# Patient Record
Sex: Female | Born: 1950 | Race: White | Hispanic: No | Smoking: Current every day smoker
Health system: Southern US, Community
[De-identification: ages and names within clinical notes are randomized; demographics above are authoritative.]

## PROBLEM LIST (undated history)

## (undated) DIAGNOSIS — I10 Essential (primary) hypertension: Secondary | ICD-10-CM

## (undated) DIAGNOSIS — J449 Chronic obstructive pulmonary disease, unspecified: Secondary | ICD-10-CM

## (undated) DIAGNOSIS — E785 Hyperlipidemia, unspecified: Secondary | ICD-10-CM

## (undated) DIAGNOSIS — Z720 Tobacco use: Secondary | ICD-10-CM

## (undated) DIAGNOSIS — I509 Heart failure, unspecified: Secondary | ICD-10-CM

## (undated) DIAGNOSIS — J410 Simple chronic bronchitis: Secondary | ICD-10-CM

## (undated) DIAGNOSIS — Z972 Presence of dental prosthetic device (complete) (partial): Secondary | ICD-10-CM

## (undated) DIAGNOSIS — T8859XA Other complications of anesthesia, initial encounter: Secondary | ICD-10-CM

## (undated) DIAGNOSIS — E049 Nontoxic goiter, unspecified: Secondary | ICD-10-CM

## (undated) DIAGNOSIS — I4891 Unspecified atrial fibrillation: Secondary | ICD-10-CM

---

## 1972-12-12 HISTORY — PX: BREAST LUMPECTOMY: SHX2

## 1988-12-12 HISTORY — PX: ABDOMINAL HYSTERECTOMY: SHX81

## 2011-12-13 HISTORY — PX: CHOLECYSTECTOMY: SHX55

## 2012-02-23 DIAGNOSIS — E039 Hypothyroidism, unspecified: Secondary | ICD-10-CM | POA: Insufficient documentation

## 2015-05-28 DIAGNOSIS — I10 Essential (primary) hypertension: Secondary | ICD-10-CM | POA: Diagnosis not present

## 2015-05-28 DIAGNOSIS — E039 Hypothyroidism, unspecified: Secondary | ICD-10-CM | POA: Diagnosis not present

## 2015-05-28 DIAGNOSIS — Z23 Encounter for immunization: Secondary | ICD-10-CM | POA: Diagnosis not present

## 2015-05-28 DIAGNOSIS — E780 Pure hypercholesterolemia: Secondary | ICD-10-CM | POA: Diagnosis not present

## 2015-05-28 DIAGNOSIS — K219 Gastro-esophageal reflux disease without esophagitis: Secondary | ICD-10-CM | POA: Diagnosis not present

## 2015-10-02 DIAGNOSIS — Z23 Encounter for immunization: Secondary | ICD-10-CM | POA: Diagnosis not present

## 2015-12-30 DIAGNOSIS — E669 Obesity, unspecified: Secondary | ICD-10-CM | POA: Diagnosis not present

## 2015-12-30 DIAGNOSIS — I10 Essential (primary) hypertension: Secondary | ICD-10-CM | POA: Diagnosis not present

## 2015-12-30 DIAGNOSIS — E7800 Pure hypercholesterolemia, unspecified: Secondary | ICD-10-CM | POA: Diagnosis not present

## 2015-12-30 DIAGNOSIS — E039 Hypothyroidism, unspecified: Secondary | ICD-10-CM | POA: Diagnosis not present

## 2016-06-28 DIAGNOSIS — E7800 Pure hypercholesterolemia, unspecified: Secondary | ICD-10-CM | POA: Diagnosis not present

## 2016-06-28 DIAGNOSIS — Z139 Encounter for screening, unspecified: Secondary | ICD-10-CM | POA: Diagnosis not present

## 2016-06-28 DIAGNOSIS — Z9181 History of falling: Secondary | ICD-10-CM | POA: Diagnosis not present

## 2016-06-28 DIAGNOSIS — E039 Hypothyroidism, unspecified: Secondary | ICD-10-CM | POA: Diagnosis not present

## 2016-06-28 DIAGNOSIS — I10 Essential (primary) hypertension: Secondary | ICD-10-CM | POA: Diagnosis not present

## 2016-06-28 DIAGNOSIS — Z1389 Encounter for screening for other disorder: Secondary | ICD-10-CM | POA: Diagnosis not present

## 2017-07-17 DIAGNOSIS — K219 Gastro-esophageal reflux disease without esophagitis: Secondary | ICD-10-CM | POA: Diagnosis not present

## 2017-07-17 DIAGNOSIS — E7800 Pure hypercholesterolemia, unspecified: Secondary | ICD-10-CM | POA: Diagnosis not present

## 2017-07-17 DIAGNOSIS — E042 Nontoxic multinodular goiter: Secondary | ICD-10-CM | POA: Diagnosis not present

## 2017-07-17 DIAGNOSIS — I10 Essential (primary) hypertension: Secondary | ICD-10-CM | POA: Diagnosis not present

## 2017-07-17 DIAGNOSIS — Z9181 History of falling: Secondary | ICD-10-CM | POA: Diagnosis not present

## 2017-07-17 DIAGNOSIS — Z139 Encounter for screening, unspecified: Secondary | ICD-10-CM | POA: Diagnosis not present

## 2017-09-28 DIAGNOSIS — Z23 Encounter for immunization: Secondary | ICD-10-CM | POA: Diagnosis not present

## 2018-01-18 DIAGNOSIS — I10 Essential (primary) hypertension: Secondary | ICD-10-CM | POA: Diagnosis not present

## 2018-01-18 DIAGNOSIS — E042 Nontoxic multinodular goiter: Secondary | ICD-10-CM | POA: Diagnosis not present

## 2018-01-18 DIAGNOSIS — E785 Hyperlipidemia, unspecified: Secondary | ICD-10-CM | POA: Diagnosis not present

## 2018-01-18 DIAGNOSIS — Z136 Encounter for screening for cardiovascular disorders: Secondary | ICD-10-CM | POA: Diagnosis not present

## 2018-01-18 DIAGNOSIS — Z0000 Encounter for general adult medical examination without abnormal findings: Secondary | ICD-10-CM | POA: Diagnosis not present

## 2018-01-18 DIAGNOSIS — Z23 Encounter for immunization: Secondary | ICD-10-CM | POA: Diagnosis not present

## 2019-05-30 DIAGNOSIS — R609 Edema, unspecified: Secondary | ICD-10-CM | POA: Diagnosis not present

## 2019-05-30 DIAGNOSIS — M2550 Pain in unspecified joint: Secondary | ICD-10-CM | POA: Diagnosis not present

## 2019-05-30 DIAGNOSIS — I10 Essential (primary) hypertension: Secondary | ICD-10-CM | POA: Diagnosis not present

## 2019-07-26 DIAGNOSIS — I10 Essential (primary) hypertension: Secondary | ICD-10-CM | POA: Diagnosis not present

## 2019-07-26 DIAGNOSIS — E7800 Pure hypercholesterolemia, unspecified: Secondary | ICD-10-CM | POA: Diagnosis not present

## 2019-07-26 DIAGNOSIS — K219 Gastro-esophageal reflux disease without esophagitis: Secondary | ICD-10-CM | POA: Diagnosis not present

## 2019-07-26 DIAGNOSIS — M1990 Unspecified osteoarthritis, unspecified site: Secondary | ICD-10-CM | POA: Diagnosis not present

## 2019-09-26 ENCOUNTER — Other Ambulatory Visit: Payer: Self-pay

## 2019-09-26 NOTE — Patient Outreach (Signed)
[facility]) Care Management  09/26/2019  Stacey Sandoval July 04, 1951 [id number]   Medication Adherence call to Stacey Sandoval Hipp[facility] Identifiers Verify spoke with patient she is past due on Atorvastatin 20 mg and Lisinopril/Hctz 20/12.5 mg patient explain she is taking both medications,patient has enough for 10 more days patient will order thru [facility] website in a couple of days. [name] is showing past due under [name].   [place] Management Direct Dial [phone]  Fax [phone] [email or web address]

## 2019-10-10 DIAGNOSIS — Z23 Encounter for immunization: Secondary | ICD-10-CM | POA: Diagnosis not present

## 2019-10-15 ENCOUNTER — Other Ambulatory Visit: Payer: Self-pay

## 2019-10-15 NOTE — Patient Outreach (Signed)
[place] [facility]) Care Management  10/15/2019  MILCA SYTSMA 1951/01/30 [id number]   Medication Adherence call to Mrs. Lyman Bishop Hippa Identifiers Verify spoke with patient she is past due on Atorvastatin 20 mg and Lisinopril/Hctz 20/12.5 mg,patient explain she takes her medication un a regular basis,patient has medication at this time but will order on line. Mrs. Spadaccini is showing past due under [place].   [place] Management Direct Dial [phone]  Fax [phone] [email or web address]

## 2020-02-24 DIAGNOSIS — Z139 Encounter for screening, unspecified: Secondary | ICD-10-CM | POA: Diagnosis not present

## 2020-02-24 DIAGNOSIS — E7800 Pure hypercholesterolemia, unspecified: Secondary | ICD-10-CM | POA: Diagnosis not present

## 2020-02-24 DIAGNOSIS — Z9181 History of falling: Secondary | ICD-10-CM | POA: Diagnosis not present

## 2020-02-24 DIAGNOSIS — M1990 Unspecified osteoarthritis, unspecified site: Secondary | ICD-10-CM | POA: Diagnosis not present

## 2020-02-24 DIAGNOSIS — I10 Essential (primary) hypertension: Secondary | ICD-10-CM | POA: Diagnosis not present

## 2020-02-24 DIAGNOSIS — K219 Gastro-esophageal reflux disease without esophagitis: Secondary | ICD-10-CM | POA: Diagnosis not present

## 2020-05-15 DIAGNOSIS — I10 Essential (primary) hypertension: Secondary | ICD-10-CM | POA: Diagnosis not present

## 2020-08-26 DIAGNOSIS — Z139 Encounter for screening, unspecified: Secondary | ICD-10-CM | POA: Diagnosis not present

## 2020-08-26 DIAGNOSIS — E7800 Pure hypercholesterolemia, unspecified: Secondary | ICD-10-CM | POA: Diagnosis not present

## 2020-08-26 DIAGNOSIS — K219 Gastro-esophageal reflux disease without esophagitis: Secondary | ICD-10-CM | POA: Diagnosis not present

## 2020-08-26 DIAGNOSIS — M1990 Unspecified osteoarthritis, unspecified site: Secondary | ICD-10-CM | POA: Diagnosis not present

## 2020-08-26 DIAGNOSIS — I10 Essential (primary) hypertension: Secondary | ICD-10-CM | POA: Diagnosis not present

## 2020-08-26 DIAGNOSIS — Z23 Encounter for immunization: Secondary | ICD-10-CM | POA: Diagnosis not present

## 2020-08-31 DIAGNOSIS — S335XXA Sprain of ligaments of lumbar spine, initial encounter: Secondary | ICD-10-CM | POA: Diagnosis not present

## 2020-08-31 DIAGNOSIS — S76011A Strain of muscle, fascia and tendon of right hip, initial encounter: Secondary | ICD-10-CM | POA: Diagnosis not present

## 2020-09-16 DIAGNOSIS — M545 Low back pain, unspecified: Secondary | ICD-10-CM | POA: Diagnosis not present

## 2020-09-18 DIAGNOSIS — M5106 Intervertebral disc disorders with myelopathy, lumbar region: Secondary | ICD-10-CM | POA: Diagnosis not present

## 2020-09-28 DIAGNOSIS — M5416 Radiculopathy, lumbar region: Secondary | ICD-10-CM | POA: Diagnosis not present

## 2020-10-14 DIAGNOSIS — M5416 Radiculopathy, lumbar region: Secondary | ICD-10-CM | POA: Diagnosis not present

## 2020-10-27 DIAGNOSIS — M5416 Radiculopathy, lumbar region: Secondary | ICD-10-CM | POA: Diagnosis not present

## 2020-11-11 DIAGNOSIS — M5416 Radiculopathy, lumbar region: Secondary | ICD-10-CM | POA: Diagnosis not present

## 2021-01-06 DIAGNOSIS — M5416 Radiculopathy, lumbar region: Secondary | ICD-10-CM | POA: Diagnosis not present

## 2021-03-03 DIAGNOSIS — M545 Low back pain, unspecified: Secondary | ICD-10-CM | POA: Diagnosis not present

## 2021-03-03 DIAGNOSIS — K219 Gastro-esophageal reflux disease without esophagitis: Secondary | ICD-10-CM | POA: Diagnosis not present

## 2021-03-03 DIAGNOSIS — Z9181 History of falling: Secondary | ICD-10-CM | POA: Diagnosis not present

## 2021-03-03 DIAGNOSIS — M1990 Unspecified osteoarthritis, unspecified site: Secondary | ICD-10-CM | POA: Diagnosis not present

## 2021-03-03 DIAGNOSIS — I10 Essential (primary) hypertension: Secondary | ICD-10-CM | POA: Diagnosis not present

## 2021-03-03 DIAGNOSIS — E7800 Pure hypercholesterolemia, unspecified: Secondary | ICD-10-CM | POA: Diagnosis not present

## 2021-03-03 DIAGNOSIS — F32A Depression, unspecified: Secondary | ICD-10-CM | POA: Diagnosis not present

## 2021-08-27 DIAGNOSIS — L03011 Cellulitis of right finger: Secondary | ICD-10-CM | POA: Diagnosis not present

## 2021-09-14 DIAGNOSIS — Z23 Encounter for immunization: Secondary | ICD-10-CM | POA: Diagnosis not present

## 2021-09-14 DIAGNOSIS — M545 Low back pain, unspecified: Secondary | ICD-10-CM | POA: Diagnosis not present

## 2021-09-14 DIAGNOSIS — Z139 Encounter for screening, unspecified: Secondary | ICD-10-CM | POA: Diagnosis not present

## 2021-09-14 DIAGNOSIS — M1990 Unspecified osteoarthritis, unspecified site: Secondary | ICD-10-CM | POA: Diagnosis not present

## 2021-09-14 DIAGNOSIS — B3731 Acute candidiasis of vulva and vagina: Secondary | ICD-10-CM | POA: Diagnosis not present

## 2021-09-14 DIAGNOSIS — I10 Essential (primary) hypertension: Secondary | ICD-10-CM | POA: Diagnosis not present

## 2021-09-14 DIAGNOSIS — E039 Hypothyroidism, unspecified: Secondary | ICD-10-CM | POA: Diagnosis not present

## 2021-09-14 DIAGNOSIS — E7800 Pure hypercholesterolemia, unspecified: Secondary | ICD-10-CM | POA: Diagnosis not present

## 2021-09-14 DIAGNOSIS — K219 Gastro-esophageal reflux disease without esophagitis: Secondary | ICD-10-CM | POA: Diagnosis not present

## 2021-09-14 DIAGNOSIS — F32A Depression, unspecified: Secondary | ICD-10-CM | POA: Diagnosis not present

## 2021-10-25 DIAGNOSIS — Z0000 Encounter for general adult medical examination without abnormal findings: Secondary | ICD-10-CM | POA: Diagnosis not present

## 2021-10-25 DIAGNOSIS — Z9181 History of falling: Secondary | ICD-10-CM | POA: Diagnosis not present

## 2021-10-25 DIAGNOSIS — E785 Hyperlipidemia, unspecified: Secondary | ICD-10-CM | POA: Diagnosis not present

## 2022-01-06 DIAGNOSIS — I10 Essential (primary) hypertension: Secondary | ICD-10-CM | POA: Diagnosis not present

## 2022-01-06 DIAGNOSIS — R609 Edema, unspecified: Secondary | ICD-10-CM | POA: Diagnosis not present

## 2022-01-06 DIAGNOSIS — M545 Low back pain, unspecified: Secondary | ICD-10-CM | POA: Diagnosis not present

## 2022-01-06 DIAGNOSIS — R0602 Shortness of breath: Secondary | ICD-10-CM | POA: Diagnosis not present

## 2022-01-21 DIAGNOSIS — I10 Essential (primary) hypertension: Secondary | ICD-10-CM | POA: Diagnosis not present

## 2022-01-21 DIAGNOSIS — M545 Low back pain, unspecified: Secondary | ICD-10-CM | POA: Diagnosis not present

## 2022-01-21 DIAGNOSIS — R609 Edema, unspecified: Secondary | ICD-10-CM | POA: Diagnosis not present

## 2022-03-03 DIAGNOSIS — R0602 Shortness of breath: Secondary | ICD-10-CM | POA: Diagnosis not present

## 2022-03-03 DIAGNOSIS — R112 Nausea with vomiting, unspecified: Secondary | ICD-10-CM | POA: Diagnosis not present

## 2022-03-03 DIAGNOSIS — R6889 Other general symptoms and signs: Secondary | ICD-10-CM | POA: Diagnosis not present

## 2022-03-03 DIAGNOSIS — R059 Cough, unspecified: Secondary | ICD-10-CM | POA: Diagnosis not present

## 2022-03-07 ENCOUNTER — Inpatient Hospital Stay
Admission: EM | Admit: 2022-03-07 | Discharge: 2022-03-16 | DRG: 190 | Disposition: A | Discharge status: Home Health | Payer: Medicare Other | Attending: Internal Medicine | Admitting: Internal Medicine

## 2022-03-07 ENCOUNTER — Encounter: Payer: Self-pay | Admitting: Emergency Medicine

## 2022-03-07 ENCOUNTER — Emergency Department: Payer: Medicare Other

## 2022-03-07 ENCOUNTER — Other Ambulatory Visit: Payer: Self-pay

## 2022-03-07 DIAGNOSIS — I110 Hypertensive heart disease with heart failure: Secondary | ICD-10-CM | POA: Diagnosis not present

## 2022-03-07 DIAGNOSIS — K449 Diaphragmatic hernia without obstruction or gangrene: Secondary | ICD-10-CM | POA: Diagnosis not present

## 2022-03-07 DIAGNOSIS — J069 Acute upper respiratory infection, unspecified: Secondary | ICD-10-CM | POA: Diagnosis present

## 2022-03-07 DIAGNOSIS — Z79899 Other long term (current) drug therapy: Secondary | ICD-10-CM | POA: Diagnosis not present

## 2022-03-07 DIAGNOSIS — J441 Chronic obstructive pulmonary disease with (acute) exacerbation: Secondary | ICD-10-CM | POA: Diagnosis not present

## 2022-03-07 DIAGNOSIS — Z20822 Contact with and (suspected) exposure to covid-19: Secondary | ICD-10-CM | POA: Diagnosis not present

## 2022-03-07 DIAGNOSIS — R0602 Shortness of breath: Secondary | ICD-10-CM | POA: Diagnosis not present

## 2022-03-07 DIAGNOSIS — F5104 Psychophysiologic insomnia: Secondary | ICD-10-CM | POA: Diagnosis present

## 2022-03-07 DIAGNOSIS — G4700 Insomnia, unspecified: Secondary | ICD-10-CM | POA: Diagnosis present

## 2022-03-07 DIAGNOSIS — T503X5A Adverse effect of electrolytic, caloric and water-balance agents, initial encounter: Secondary | ICD-10-CM | POA: Diagnosis not present

## 2022-03-07 DIAGNOSIS — I422 Other hypertrophic cardiomyopathy: Secondary | ICD-10-CM | POA: Diagnosis present

## 2022-03-07 DIAGNOSIS — I4891 Unspecified atrial fibrillation: Secondary | ICD-10-CM | POA: Diagnosis not present

## 2022-03-07 DIAGNOSIS — R051 Acute cough: Secondary | ICD-10-CM | POA: Diagnosis not present

## 2022-03-07 DIAGNOSIS — I959 Hypotension, unspecified: Secondary | ICD-10-CM | POA: Diagnosis not present

## 2022-03-07 DIAGNOSIS — E785 Hyperlipidemia, unspecified: Secondary | ICD-10-CM | POA: Diagnosis present

## 2022-03-07 DIAGNOSIS — J811 Chronic pulmonary edema: Secondary | ICD-10-CM

## 2022-03-07 DIAGNOSIS — I5033 Acute on chronic diastolic (congestive) heart failure: Secondary | ICD-10-CM

## 2022-03-07 DIAGNOSIS — F17210 Nicotine dependence, cigarettes, uncomplicated: Secondary | ICD-10-CM | POA: Diagnosis present

## 2022-03-07 DIAGNOSIS — F17200 Nicotine dependence, unspecified, uncomplicated: Secondary | ICD-10-CM

## 2022-03-07 DIAGNOSIS — B348 Other viral infections of unspecified site: Secondary | ICD-10-CM

## 2022-03-07 DIAGNOSIS — Z6831 Body mass index (BMI) 31.0-31.9, adult: Secondary | ICD-10-CM

## 2022-03-07 DIAGNOSIS — I5021 Acute systolic (congestive) heart failure: Secondary | ICD-10-CM | POA: Diagnosis not present

## 2022-03-07 DIAGNOSIS — B9789 Other viral agents as the cause of diseases classified elsewhere: Secondary | ICD-10-CM | POA: Diagnosis not present

## 2022-03-07 DIAGNOSIS — R079 Chest pain, unspecified: Secondary | ICD-10-CM | POA: Diagnosis not present

## 2022-03-07 DIAGNOSIS — E876 Hypokalemia: Secondary | ICD-10-CM

## 2022-03-07 DIAGNOSIS — Z5329 Procedure and treatment not carried out because of patient's decision for other reasons: Secondary | ICD-10-CM | POA: Diagnosis not present

## 2022-03-07 DIAGNOSIS — E871 Hypo-osmolality and hyponatremia: Secondary | ICD-10-CM

## 2022-03-07 DIAGNOSIS — I10 Essential (primary) hypertension: Secondary | ICD-10-CM | POA: Diagnosis not present

## 2022-03-07 DIAGNOSIS — J9601 Acute respiratory failure with hypoxia: Secondary | ICD-10-CM | POA: Diagnosis not present

## 2022-03-07 DIAGNOSIS — J810 Acute pulmonary edema: Secondary | ICD-10-CM | POA: Insufficient documentation

## 2022-03-07 DIAGNOSIS — J8410 Pulmonary fibrosis, unspecified: Secondary | ICD-10-CM | POA: Diagnosis not present

## 2022-03-07 DIAGNOSIS — I517 Cardiomegaly: Secondary | ICD-10-CM | POA: Diagnosis not present

## 2022-03-07 DIAGNOSIS — R059 Cough, unspecified: Secondary | ICD-10-CM | POA: Diagnosis not present

## 2022-03-07 DIAGNOSIS — E669 Obesity, unspecified: Secondary | ICD-10-CM

## 2022-03-07 HISTORY — DX: Chronic obstructive pulmonary disease, unspecified: J44.9

## 2022-03-07 HISTORY — DX: Essential (primary) hypertension: I10

## 2022-03-07 HISTORY — DX: Tobacco use: Z72.0

## 2022-03-07 HISTORY — DX: Hyperlipidemia, unspecified: E78.5

## 2022-03-07 LAB — BASIC METABOLIC PANEL
Anion gap: 13 (ref 5–15)
BUN: 13 mg/dL (ref 8–23)
CO2: 25 mmol/L (ref 22–32)
Calcium: 9.3 mg/dL (ref 8.9–10.3)
Chloride: 89 mmol/L — ABNORMAL LOW (ref 98–111)
Creatinine, Ser: 0.69 mg/dL (ref 0.44–1.00)
GFR, Estimated: >60 mL/min (ref >60)
Glucose, Bld: 100 mg/dL — ABNORMAL HIGH (ref 70–99)
Potassium: 4.2 mmol/L (ref 3.5–5.1)
Sodium: 127 mmol/L — ABNORMAL LOW (ref 135–145)

## 2022-03-07 LAB — CBC
HCT: 38.5 % (ref 36.0–46.0)
Hemoglobin: 12.3 g/dL (ref 12.0–15.0)
MCH: 31.8 pg (ref 26.0–34.0)
MCHC: 31.9 g/dL (ref 30.0–36.0)
MCV: 99.5 fL (ref 80.0–100.0)
Platelets: 287 10*3/uL (ref 150–400)
RBC: 3.87 MIL/uL (ref 3.87–5.11)
RDW: 12.1 % (ref 11.5–15.5)
WBC: 7.1 10*3/uL (ref 4.0–10.5)
nRBC: 0 % (ref 0.0–0.2)

## 2022-03-07 LAB — TROPONIN I (HIGH SENSITIVITY)
Troponin I (High Sensitivity): 16 ng/L (ref <18)
Troponin I (High Sensitivity): 16 ng/L (ref <18)

## 2022-03-07 LAB — TSH: TSH: 0.208 u[IU]/mL — ABNORMAL LOW (ref 0.350–4.500)

## 2022-03-07 LAB — T4, FREE: Free T4: 0.97 ng/dL (ref 0.61–1.12)

## 2022-03-07 LAB — BRAIN NATRIURETIC PEPTIDE: B Natriuretic Peptide: 82.2 pg/mL (ref 0.0–100.0)

## 2022-03-07 LAB — RESP PANEL BY RT-PCR (FLU A&B, COVID) ARPGX2
Influenza A by PCR: NEGATIVE
Influenza B by PCR: NEGATIVE
SARS Coronavirus 2 by RT PCR: NEGATIVE

## 2022-03-07 LAB — PROCALCITONIN: Procalcitonin: <0.1 ng/mL

## 2022-03-07 MED ORDER — IOHEXOL 350 MG/ML SOLN
75.0000 mL | Freq: Once | INTRAVENOUS | Status: AC | PRN
  Administered 2022-03-07: 75 mL via INTRAVENOUS

## 2022-03-07 MED ORDER — METHYLPREDNISOLONE SODIUM SUCC 125 MG IJ SOLR
125.0000 mg | Freq: Once | INTRAMUSCULAR | Status: AC
  Administered 2022-03-07: 125 mg via INTRAVENOUS
  Filled 2022-03-07: qty 2

## 2022-03-07 MED ORDER — ACETAMINOPHEN 650 MG RE SUPP: 650.0000 mg | Freq: Four times a day (QID) | RECTAL | Status: AC | PRN

## 2022-03-07 MED ORDER — METHYLPREDNISOLONE SODIUM SUCC 40 MG IJ SOLR: 40.0000 mg | Freq: Two times a day (BID) | INTRAMUSCULAR | Status: DC

## 2022-03-07 MED ORDER — ACETAMINOPHEN 325 MG PO TABS: 650.0000 mg | ORAL_TABLET | Freq: Four times a day (QID) | ORAL | Status: AC | PRN

## 2022-03-07 MED ORDER — ONDANSETRON HCL 4 MG PO TABS: 4.0000 mg | ORAL_TABLET | Freq: Four times a day (QID) | ORAL | Status: DC | PRN

## 2022-03-07 MED ORDER — ENOXAPARIN SODIUM 40 MG/0.4ML IJ SOSY
40.0000 mg | PREFILLED_SYRINGE | INTRAMUSCULAR | Status: DC
  Administered 2022-03-07 – 2022-03-13 (×7): 40 mg via SUBCUTANEOUS
  Filled 2022-03-07 (×7): qty 0.4

## 2022-03-07 MED ORDER — SODIUM CHLORIDE 0.9 % IV SOLN
500.0000 mg | INTRAVENOUS | Status: DC
  Administered 2022-03-07 – 2022-03-09 (×3): 500 mg via INTRAVENOUS
  Filled 2022-03-07: qty 500
  Filled 2022-03-07 (×2): qty 5

## 2022-03-07 MED ORDER — IPRATROPIUM-ALBUTEROL 0.5-2.5 (3) MG/3ML IN SOLN
3.0000 mL | Freq: Once | RESPIRATORY_TRACT | Status: AC
  Administered 2022-03-07: 3 mL via RESPIRATORY_TRACT
  Filled 2022-03-07: qty 6

## 2022-03-07 MED ORDER — SODIUM CHLORIDE 0.9 % IV SOLN
2.0000 g | INTRAVENOUS | Status: DC
  Administered 2022-03-07 – 2022-03-08 (×2): 2 g via INTRAVENOUS
  Filled 2022-03-07: qty 20
  Filled 2022-03-07: qty 2

## 2022-03-07 MED ORDER — DM-GUAIFENESIN ER 30-600 MG PO TB12
1.0000 | ORAL_TABLET | Freq: Two times a day (BID) | ORAL | Status: DC | PRN
  Administered 2022-03-07: 1 via ORAL
  Filled 2022-03-07 (×2): qty 1

## 2022-03-07 MED ORDER — ONDANSETRON HCL 4 MG/2ML IJ SOLN: 4.0000 mg | Freq: Four times a day (QID) | INTRAMUSCULAR | Status: DC | PRN

## 2022-03-07 MED ORDER — MELATONIN 5 MG PO TABS
5.0000 mg | ORAL_TABLET | Freq: Every evening | ORAL | Status: DC | PRN
  Administered 2022-03-10: 5 mg via ORAL
  Filled 2022-03-07: qty 1

## 2022-03-07 MED ORDER — PREDNISONE 20 MG PO TABS: 40.0000 mg | ORAL_TABLET | Freq: Every day | ORAL | Status: DC

## 2022-03-07 MED ORDER — LACTATED RINGERS IV SOLN: INTRAVENOUS | Status: DC

## 2022-03-07 NOTE — ED Triage Notes (Signed)
Pt via POV from home. Pt c/o SOB, cough, fatigue, and nausea. See first nurse note. Pt on 2L [place] at this time. Denies CHF/COPD. Denies any chest pain at this time. Pt is A&OX4 and NAD ?

## 2022-03-07 NOTE — Hospital Course (Addendum)
AcuteMs. Stacey Sandoval is a 71 year old female insomnia, hypertension, hyperlipidemia, who presents emergency department for chief concerns of shortness of breath and cough. ? ?Patient was diagnosed with COPD exacerbation, chronic Congestive Heart Failure.  She Was Given IV Steroids, IV Lasix. ?Condition Had Improved, Currently She Is Medically Stable to Be Discharged. ?

## 2022-03-07 NOTE — ED Provider Notes (Signed)
? ?[facility] ?Provider Note ? ? ? Event Date/Time  ? First MD Initiated Contact with Patient 03/07/22 1614   ?  (approximate) ? ? ?History  ? ?Shortness of Breath and Cough ? ? ?HPI ? ?Stacey Sandoval is a 71 y.o. female with a long history of smoking presents to the ER for evaluation of 1 week of progressively worsening cough congestion shortness of breath fatigue and exertional dyspnea.  States that her children were sick with recent viral illness but it was very mild.  She feels like she is gotten progressively more sick and states that her O2 saturation has been dipping into the 80s.  Does not wear home oxygen does not wear CPAP. ?  ? ? ?Physical Exam  ? ?Triage Vital Signs: ?ED Triage Vitals  ?Enc Vitals Group  ?   BP 03/07/22 1514 136/67  ?   Pulse Rate 03/07/22 1514 (!) 102  ?   Resp 03/07/22 1514 20  ?   Temp 03/07/22 1514 98.6 ?F (37 ?C)  ?   Temp Source 03/07/22 1514 Oral  ?   SpO2 03/07/22 1514 95 %  ?   Weight 03/07/22 1511 160 lb (72.6 kg)  ?   Height 03/07/22 1511 5' (1.524 m)  ?   Head Circumference --   ?   Peak Flow --   ?   Pain Score 03/07/22 1511 0  ?   Pain Loc --   ?   Pain Edu? --   ?   Excl. in GC? --   ? ? ?Most recent vital signs: ?Vitals:  ? 03/07/22 1900 03/07/22 2000  ?BP: 131/60 128/61  ?Pulse: 95 99  ?Resp: 18 18  ?Temp:    ?SpO2: 95% 96%  ? ? ? ?Constitutional: Alert  ?Eyes: Conjunctivae are normal.  ?Head: Atraumatic. ?Nose: No congestion/rhinnorhea. ?Mouth/Throat: Mucous membranes are moist.   ?Neck: Painless ROM.  ?Cardiovascular:   Good peripheral circulation. Holosytolic murmur, g/r ?Respiratory: Normal respiratory effort.  No retractions. Coarse bibasilar bs. No wheeze ?Gastrointestinal: Soft and nontender.  ?Musculoskeletal:  no deformity ?Neurologic:  MAE spontaneously. No gross focal neurologic deficits are appreciated.  ?Skin:  Skin is warm, dry and intact. No rash noted. ?Psychiatric: Mood and affect are normal. Speech and behavior are  normal. ? ? ? ?ED Results / Procedures / Treatments  ? ?Labs ?(all labs ordered are listed, but only abnormal results are displayed) ?Labs Reviewed  ?BASIC METABOLIC PANEL - Abnormal; Notable for the following components:  ?    Result Value  ? Sodium 127 (*)   ? Chloride 89 (*)   ? Glucose, Bld 100 (*)   ? All other components within normal limits  ?TSH - Abnormal; Notable for the following components:  ? TSH 0.208 (*)   ? All other components within normal limits  ?RESP PANEL BY RT-PCR (FLU A&B, COVID) [id number]  ?CULTURE, BLOOD (ROUTINE X 2)  ?CULTURE, BLOOD (ROUTINE X 2)  ?CBC  ?BRAIN NATRIURETIC PEPTIDE  ?T4, FREE  ?BASIC METABOLIC PANEL  ?CBC  ?TROPONIN I (HIGH SENSITIVITY)  ?TROPONIN I (HIGH SENSITIVITY)  ? ? ? ?EKG ? ?ED ECG REPORT ?I, Willy Eddy, the attending physician, personally viewed and interpreted this ECG. ? ? Date: 03/07/2022 ? EKG Time: 15:18 ? Rate: 100 ? Rhythm: sinus ? Axis: normal ? Intervals:  normal ? ST&T Change: no stemi, no depression ? ? ? ?RADIOLOGY ?Please see ED Course for my review and interpretation. ? ?I personally reviewed  all radiographic images ordered to evaluate for the above acute complaints and reviewed radiology reports and findings.  These findings were personally discussed with the patient.  Please see medical record for radiology report. ? ? ? ?PROCEDURES: ? ?Critical Care performed: Yes, see critical care procedure note(s) ? ?.Critical Care ?Performed by: Willy Eddy, MD ?Authorized by: Willy Eddy, MD  ? ?Critical care provider statement:  ?  Critical care time (minutes):  35 ?  Critical care was time spent personally by me on the following activities:  Ordering and performing treatments and interventions, ordering and review of laboratory studies, ordering and review of radiographic studies, pulse oximetry, re-evaluation of patient's condition, review of old charts, obtaining history from patient or surrogate, examination of patient, evaluation of  patient's response to treatment, discussions with primary provider, discussions with consultants and development of treatment plan with patient or surrogate ? ? ?MEDICATIONS ORDERED IN ED: ?Medications  ?lactated ringers infusion (has no administration in time range)  ?cefTRIAXone (ROCEPHIN) 2 g in sodium chloride 0.9 % 100 mL IVPB (2 g Intravenous New Bag/Given 03/07/22 1952)  ?azithromycin (ZITHROMAX) 500 mg in sodium chloride 0.9 % 250 mL IVPB (has no administration in time range)  ?acetaminophen (TYLENOL) tablet 650 mg (has no administration in time range)  ?  Or  ?acetaminophen (TYLENOL) suppository 650 mg (has no administration in time range)  ?ondansetron (ZOFRAN) tablet 4 mg (has no administration in time range)  ?  Or  ?ondansetron (ZOFRAN) injection 4 mg (has no administration in time range)  ?methylPREDNISolone sodium succinate (SOLU-MEDROL) 40 mg/mL injection 40 mg (has no administration in time range)  ?  Followed by  ?predniSONE (DELTASONE) tablet 40 mg (has no administration in time range)  ?enoxaparin (LOVENOX) injection 40 mg (has no administration in time range)  ?melatonin tablet 5 mg (has no administration in time range)  ?ipratropium-albuterol (DUONEB) 0.5-2.5 (3) MG/3ML nebulizer solution 3 mL (3 mLs Nebulization Given 03/07/22 1634)  ?iohexol (OMNIPAQUE) 350 MG/ML injection 75 mL (75 mLs Intravenous Contrast Given 03/07/22 1653)  ?methylPREDNISolone sodium succinate (SOLU-MEDROL) 125 mg/2 mL injection 125 mg (125 mg Intravenous Given 03/07/22 1946)  ? ? ? ?IMPRESSION / MDM / ASSESSMENT AND PLAN / ED COURSE  ?I reviewed the triage vital signs and the nursing notes. ?             ?               ? ?Differential diagnosis includes, but is not limited to, Asthma, copd, CHF, pna, ptx, malignancy, Pe, anemia ? ?Patient presenting with acute respiratory failure with hypoxia as described above.  Chest x-ray by my review interpretation does not show any evidence of pneumothorax.  Concern for possible  infiltrate versus malignancy.  Given her tachycardia and hypoxia will order CTA to further evaluate given concern for possible PE.  She does not have a white count not febrile not meeting septic criteria.  Her troponin is negative.  We will give nebulizer treatment but she does not much wheezing on exam we will reassess. ? ?Clinical Course as of 03/07/22 2023  ?Mon Mar 07, 2022  ?1943 CT imaging my interpretation review shows no pneumothorax.  Will cover for pneumonia given new hypoxia no PE.  RVP negative.  Does not seem consistent with congestive heart failure.  Given nebulizers and steroids given possible component of bronchitis COPD given her smoking history.  Given her hypoxia will require admission to the hospital. [PR]  ?2022 Patient discussed in consultation  with hospitalist who agreed admit patient to their service. [PR]  ?  ?Clinical Course User Index ?[PR] Willy Eddyobinson, Adrieanna Boteler, MD  ? ? ? ?FINAL CLINICAL IMPRESSION(S) / ED DIAGNOSES  ? ?Final diagnoses:  ?Acute respiratory failure with hypoxia (HCC)  ? ? ? ?Rx / DC Orders  ? ?ED Discharge Orders   ? ? None  ? ?  ? ? ? ?Note:  This document was prepared using Dragon voice recognition software and may include unintentional dictation errors. ? ?  ?Willy Eddyobinson, Averleigh Savary, MD ?03/07/22 2023 ? ?

## 2022-03-07 NOTE — ED Notes (Signed)
Pt has wet cough. States has bee SOB for 1 week. Worse with walking and lying flat. Pt on 2L. ?

## 2022-03-07 NOTE — ED Notes (Signed)
Blankets provided.

## 2022-03-07 NOTE — ED Notes (Signed)
Pt declines covid swab. Had rapid antigen test today at [facility] for covid and flu and was negative. ?

## 2022-03-07 NOTE — ED Notes (Signed)
Pt NAD on [place] 2LPM. A/ox4, speaking in full and complete sentences. c/o SOB and cough. LS clear bilaterally.  ?

## 2022-03-07 NOTE — H&P (Signed)
?History and Physical  ? ?Stacey IvanoffCarol A Sandoval [id number] DOB: [birth year] DOA: 03/07/2022 ? ?[facility]  ?Patient coming from: Home via POV ? ?I have personally briefly reviewed patient[facility]. ? ?Chief Concern: Cough and shortness of breath ? ?HPI: Ms. Stacey MunroeCarol Sandoval is a 71 year old female insomnia, hypertension, hyperlipidemia, who presents emergency department for chief concerns of shortness of breath and cough. ? ?ED vitals show temperature of 98.6, respiration rate of 20, heart rate 102, blood pressure 136/67, SPO2 of 95% on 2 L nasal cannula. ? ?Serum sodium 127, potassium 4.2, chloride 89, bicarb 25, BUN of 13, serum creatinine of 0.62, GFR greater than 60, nonfasting blood glucose 100, WBC 7.1, hemoglobin 12.3, platelets of 287. ? ?BNP was 82.2. ? ?High sensitive troponin was 16 and was unchanged at 16 on repeat. ? ?COVID/influenza A/influenza B PCR were negative. ? ?TSH was 0.208, T4: 0.87. ? ?ED treatment: DuoNeb x1, Solu-Medrol 125 mg, azithromycin 500 mg IV, ceftriaxone 2 g IV. ? ?At bedside she is able to tell me her full name, age, current calendar year and she knows her daughter is at bedside. ? ?Shortness of breath started month, endroses sick contacts. Daughter and grandchildren. She endorses nausea and vomiting, once or twice per day, food products.  ?She denies diarrhea.  ? ?Per daughter patient was prescribed outpatient Zofran 4 mg as needed for nausea, albuterol inhaler as needed.  Patient was also prescribed antitussives that was a gel capsule but patient did not want to take it as it is a cough suppressant.  Daughter has purchased patient over-the-counter Mucinex. ? ?Social history: She is a current tobacco user, smokes 1/2 per day. She quit about 1 week ago. She started at age 71. She drinks two glasses of wine every night. She is retired and was formerly a Engineer, civil (consulting)nurse  ? ?Vaccination history: she is vaccinated for covid 19 and influenza   ? ?ROS: ?Constitutional: no weight change, no fever ?ENT/Mouth: no sore throat, no rhinorrhea ?Eyes: no eye pain, no vision changes ?Cardiovascular: no chest pain, + dyspnea,  no edema, no palpitations ?Respiratory: + cough, no sputum, no wheezing ?Gastrointestinal: no nausea, no vomiting, no diarrhea, no constipation ?Genitourinary: no urinary incontinence, no dysuria, no hematuria ?Musculoskeletal: no arthralgias, no myalgias ?Skin: no skin lesions, no pruritus, ?Neuro: + weakness, no loss of consciousness, no syncope ?Psych: no anxiety, no depression, + decrease appetite ?Heme/Lymph: no bruising, no bleeding ? ?ED Course: Discussed with emergency medicine provider, patient requiring hospitalization for COPD exacerbation complicated by viral infection. ? ?Assessment/Plan ? ?Principal Problem: ?  Shortness of breath ?Active Problems: ?  Insomnia ?  Essential hypertension ?  Hyperlipidemia ?  ? ?Assessment and Plan: ?* Shortness of breath ?- Query COPD exacerbation complicated by viral respiratory infection ?- Query viral infection ?- COVID/influenza A/influenza B PCR were negative ?- She denies known sick contacts ?- Scheduled albuterol and Pulmicort nebulizer 3 times daily ?- Solu-Medrol 40 mg IV twice daily with taper to prednisone p.o. ?- Albuterol inhaler, 2 puffs every 6 hours as needed for shortness of breath and wheezing ?- Admit to telemetry medical, observation ? ?Hyperlipidemia ?- Resumed atorvastatin 20 mg daily ? ?Essential hypertension ?- I have resumed metoprolol succinate 25 mg daily ?- Have not resumed valsartan-hydrochlorothiazide 320-25 mg daily due to patient currently normotensive at this time ?- A.m. team to resume antihypertensive medications if indicated ? ?Insomnia ?- Patient reports that her primary care provider has tried a number  of medications including trazodone, melatonin, Ambien ?- She states that trazodone makes her vomit ?- She states the other medications did not work ?-  Ultimately she takes Benadryl nightly and drinks 2 glasses of wine with ice and this has somewhat helped her have sleep ?- I counseled patient on the harm of chronic Benadryl nightly use including loss of healthy sleep ?- I also counseled patient on drinking 2 full glasses of wine daily as this is beyond the recommended amount of alcohol for her gender ?-She does endorse that she takes 3 to 4 cups of coffee per day ?- I counseled patient on complete cessation of caffeine intake for 1 to 2 weeks and gradual decrease in removal of Benadryl and wine from her nightly routine ?- Patient can start with removing the caffeine first and then gradually decreasing to 1.5 glass and then 1 glass of wine nightly ? ?Chart reviewed.  ? ?DVT prophylaxis: Enoxaparin ?Code Status: Full code ?Diet: Heart healthy ?Family Communication: Updated daughter at bedside ?Disposition Plan: Pending clinical course ?Consults called: None at this time ?Admission status: Telemetry medical, observation ? ?Past Medical History:  ?Diagnosis Date  ? COPD (chronic obstructive pulmonary disease) (HCC)   ? Hyperlipidemia   ? Hypertension   ? Tobacco use   ? ?History reviewed. No pertinent surgical history. ? ?Social History:  reports that she has quit smoking. Her smoking use included cigarettes. She has quit using smokeless tobacco. She reports current alcohol use. She reports that she does not currently use drugs. ? ?No Known Allergies ?History reviewed. No pertinent family history. ?Family history: Family history reviewed and not pertinent ? ?Prior to Admission medications   ?Atorvastatin 20 mg daily  ? ?Physical Exam: ?Vitals:  ? 03/07/22 2000 03/07/22 2030 03/07/22 2200 03/07/22 2345  ?BP: 128/61 (!) 143/68 (!) 144/85   ?Pulse: 99 (!) 101 (!) 103 (!) 105  ?Resp: 18 18 18    ?Temp:   98.4 ?F (36.9 ?C)   ?TempSrc:   Oral   ?SpO2: 96% 94% 97% 94%  ?Weight:      ?Height:      ? ?Constitutional: appears age-appropriate, NAD, calm, comfortable ?Eyes: PERRL,  lids and conjunctivae normal ?ENMT: Mucous membranes are moist. Posterior pharynx clear of any exudate or lesions. Age-appropriate dentition. Hearing appropriate ?Neck: normal, supple, no masses, no thyromegaly ?Respiratory: clear to auscultation bilaterally, no wheezing, no crackles. Normal respiratory effort. No accessory muscle use.  ?Cardiovascular: Regular rate and rhythm, no murmurs / rubs / gallops. No extremity edema. 2+ pedal pulses. No carotid bruits.  ?Abdomen: no tenderness, no masses palpated, no hepatosplenomegaly. Bowel sounds positive.  ?Musculoskeletal: no clubbing / cyanosis. No joint deformity upper and lower extremities. Good ROM, no contractures, no atrophy. Normal muscle tone.  ?Skin: no rashes, lesions, ulcers. No induration ?Neurologic: Sensation intact. Strength 5/5 in all 4.  ?Psychiatric: Normal judgment and insight. Alert and oriented x 3. Normal mood.  ? ?EKG: independently reviewed, showing sinus tachycardia with rate of 101, QTc 412 ? ?Chest x-ray on Admission: I personally reviewed and I agree with radiologist reading as below. ? ?DG Chest 2 View ? ?Result Date: 03/07/2022 ?CLINICAL DATA:  Chest pain, cough EXAM: CHEST - 2 VIEW COMPARISON:  None. FINDINGS: Cardiac size is within normal limits. There are no signs of pulmonary edema. Increased markings are seen in the right lower lung fields. There is diffuse haziness in the medial left upper lung fields. Apparent shift of mediastinum to the left may be due to  rotation. Left lateral CP angle is indistinct. There is no pneumothorax. Dextroscoliosis is seen in the thoracic spine. Osteopenia is seen in bony structures. IMPRESSION: Increased markings in the right lower lung fields suggest scarring or subsegmental atelectasis/pneumonia. There is diffuse haziness in the medial left upper lung fields which may be an artifact caused by prominent brachiocephalic vessels or pleural thickening or focal pneumonia or neoplastic process in the left  upper lobe. Short-term follow-up chest radiographs along with CT if warranted should be considered. Electronically Signed   By: Ernie Avena M.D.   On: 03/07/2022 15:48  ? ?CT Angio Chest PE W and/or Wo Con

## 2022-03-07 NOTE — ED Triage Notes (Signed)
First Nurse Note:  From called telphone handoff, patient with 1 week history of ocough and SOB today.  RA sats were 83%, patient placed on 2l/ [place] and sats improved to 96%.  CXR showed LUL infiltrate and enlarged heart.  Negative for COVID and FLu. ? ?Patient arrives to ED on RA.  Placed on2l/ [place].  AAOx3.  Skin warm and dry. NAD ?

## 2022-03-08 ENCOUNTER — Observation Stay: Payer: Medicare Other

## 2022-03-08 ENCOUNTER — Inpatient Hospital Stay (HOSPITAL_COMMUNITY)
Admit: 2022-03-08 | Discharge: 2022-03-08 | Disposition: A | Discharge status: Home / Self Care | Payer: Medicare Other | Attending: Internal Medicine | Admitting: Internal Medicine

## 2022-03-08 ENCOUNTER — Encounter: Payer: Self-pay | Admitting: Internal Medicine

## 2022-03-08 DIAGNOSIS — I517 Cardiomegaly: Secondary | ICD-10-CM | POA: Diagnosis not present

## 2022-03-08 DIAGNOSIS — J9601 Acute respiratory failure with hypoxia: Secondary | ICD-10-CM | POA: Diagnosis not present

## 2022-03-08 DIAGNOSIS — I110 Hypertensive heart disease with heart failure: Secondary | ICD-10-CM | POA: Diagnosis present

## 2022-03-08 DIAGNOSIS — Z6831 Body mass index (BMI) 31.0-31.9, adult: Secondary | ICD-10-CM | POA: Diagnosis not present

## 2022-03-08 DIAGNOSIS — F5104 Psychophysiologic insomnia: Secondary | ICD-10-CM | POA: Diagnosis present

## 2022-03-08 DIAGNOSIS — B9789 Other viral agents as the cause of diseases classified elsewhere: Secondary | ICD-10-CM | POA: Diagnosis present

## 2022-03-08 DIAGNOSIS — R0602 Shortness of breath: Secondary | ICD-10-CM | POA: Diagnosis not present

## 2022-03-08 DIAGNOSIS — E785 Hyperlipidemia, unspecified: Secondary | ICD-10-CM | POA: Diagnosis present

## 2022-03-08 DIAGNOSIS — I4891 Unspecified atrial fibrillation: Secondary | ICD-10-CM | POA: Diagnosis not present

## 2022-03-08 DIAGNOSIS — J811 Chronic pulmonary edema: Secondary | ICD-10-CM

## 2022-03-08 DIAGNOSIS — I959 Hypotension, unspecified: Secondary | ICD-10-CM | POA: Diagnosis not present

## 2022-03-08 DIAGNOSIS — R0902 Hypoxemia: Secondary | ICD-10-CM | POA: Diagnosis not present

## 2022-03-08 DIAGNOSIS — E871 Hypo-osmolality and hyponatremia: Secondary | ICD-10-CM | POA: Diagnosis not present

## 2022-03-08 DIAGNOSIS — Z20822 Contact with and (suspected) exposure to covid-19: Secondary | ICD-10-CM | POA: Diagnosis present

## 2022-03-08 DIAGNOSIS — F17210 Nicotine dependence, cigarettes, uncomplicated: Secondary | ICD-10-CM | POA: Diagnosis present

## 2022-03-08 DIAGNOSIS — I5021 Acute systolic (congestive) heart failure: Secondary | ICD-10-CM | POA: Diagnosis not present

## 2022-03-08 DIAGNOSIS — T503X5A Adverse effect of electrolytic, caloric and water-balance agents, initial encounter: Secondary | ICD-10-CM | POA: Diagnosis not present

## 2022-03-08 DIAGNOSIS — I422 Other hypertrophic cardiomyopathy: Secondary | ICD-10-CM | POA: Diagnosis present

## 2022-03-08 DIAGNOSIS — I10 Essential (primary) hypertension: Secondary | ICD-10-CM | POA: Diagnosis present

## 2022-03-08 DIAGNOSIS — I5033 Acute on chronic diastolic (congestive) heart failure: Secondary | ICD-10-CM | POA: Diagnosis not present

## 2022-03-08 DIAGNOSIS — E876 Hypokalemia: Secondary | ICD-10-CM | POA: Diagnosis present

## 2022-03-08 DIAGNOSIS — Z5329 Procedure and treatment not carried out because of patient's decision for other reasons: Secondary | ICD-10-CM | POA: Diagnosis not present

## 2022-03-08 DIAGNOSIS — J441 Chronic obstructive pulmonary disease with (acute) exacerbation: Secondary | ICD-10-CM | POA: Diagnosis present

## 2022-03-08 DIAGNOSIS — J810 Acute pulmonary edema: Secondary | ICD-10-CM | POA: Insufficient documentation

## 2022-03-08 DIAGNOSIS — E669 Obesity, unspecified: Secondary | ICD-10-CM | POA: Diagnosis present

## 2022-03-08 DIAGNOSIS — Z79899 Other long term (current) drug therapy: Secondary | ICD-10-CM | POA: Diagnosis not present

## 2022-03-08 DIAGNOSIS — G4700 Insomnia, unspecified: Secondary | ICD-10-CM | POA: Diagnosis present

## 2022-03-08 DIAGNOSIS — J069 Acute upper respiratory infection, unspecified: Secondary | ICD-10-CM | POA: Diagnosis present

## 2022-03-08 LAB — BASIC METABOLIC PANEL
Anion gap: 9 (ref 5–15)
BUN: 13 mg/dL (ref 8–23)
CO2: 30 mmol/L (ref 22–32)
Calcium: 9.2 mg/dL (ref 8.9–10.3)
Chloride: 89 mmol/L — ABNORMAL LOW (ref 98–111)
Creatinine, Ser: 0.57 mg/dL (ref 0.44–1.00)
GFR, Estimated: >60 mL/min (ref >60)
Glucose, Bld: 136 mg/dL — ABNORMAL HIGH (ref 70–99)
Potassium: 4.3 mmol/L (ref 3.5–5.1)
Sodium: 128 mmol/L — ABNORMAL LOW (ref 135–145)

## 2022-03-08 LAB — BLOOD GAS, ARTERIAL
Acid-Base Excess: 4.4 mmol/L — ABNORMAL HIGH (ref 0.0–2.0)
Bicarbonate: 32.7 mmol/L — ABNORMAL HIGH (ref 20.0–28.0)
O2 Content: 8 L/min
O2 Saturation: 100 %
Patient temperature: 37
pCO2 arterial: 65 mmHg — ABNORMAL HIGH (ref 32–48)
pH, Arterial: 7.31 — ABNORMAL LOW (ref 7.35–7.45)
pO2, Arterial: 107 mmHg (ref 83–108)

## 2022-03-08 LAB — CBC
HCT: 37.2 % (ref 36.0–46.0)
Hemoglobin: 12 g/dL (ref 12.0–15.0)
MCH: 31.8 pg (ref 26.0–34.0)
MCHC: 32.3 g/dL (ref 30.0–36.0)
MCV: 98.7 fL (ref 80.0–100.0)
Platelets: 289 10*3/uL (ref 150–400)
RBC: 3.77 MIL/uL — ABNORMAL LOW (ref 3.87–5.11)
RDW: 11.9 % (ref 11.5–15.5)
WBC: 5.7 10*3/uL (ref 4.0–10.5)
nRBC: 0 % (ref 0.0–0.2)

## 2022-03-08 LAB — OSMOLALITY: Osmolality: 285 mOsm/kg (ref 275–295)

## 2022-03-08 MED ORDER — METHYLPREDNISOLONE SODIUM SUCC 125 MG IJ SOLR
80.0000 mg | INTRAMUSCULAR | Status: DC
  Administered 2022-03-08 – 2022-03-09 (×2): 80 mg via INTRAVENOUS
  Filled 2022-03-08 (×2): qty 2

## 2022-03-08 MED ORDER — FAMOTIDINE 20 MG PO TABS
40.0000 mg | ORAL_TABLET | Freq: Two times a day (BID) | ORAL | Status: DC
  Administered 2022-03-08 – 2022-03-16 (×18): 40 mg via ORAL
  Filled 2022-03-08 (×19): qty 2

## 2022-03-08 MED ORDER — BENZONATATE 100 MG PO CAPS
200.0000 mg | ORAL_CAPSULE | Freq: Three times a day (TID) | ORAL | Status: DC | PRN
  Administered 2022-03-08 – 2022-03-10 (×2): 200 mg via ORAL
  Filled 2022-03-08 (×2): qty 2

## 2022-03-08 MED ORDER — METHYLPREDNISOLONE SODIUM SUCC 40 MG IJ SOLR: 40.0000 mg | Freq: Two times a day (BID) | INTRAMUSCULAR | Status: DC

## 2022-03-08 MED ORDER — GABAPENTIN 300 MG PO CAPS
600.0000 mg | ORAL_CAPSULE | Freq: Two times a day (BID) | ORAL | Status: DC
  Administered 2022-03-08 – 2022-03-09 (×5): 600 mg via ORAL
  Administered 2022-03-10: 300 mg via ORAL
  Administered 2022-03-10 – 2022-03-13 (×6): 600 mg via ORAL
  Administered 2022-03-13: 300 mg via ORAL
  Administered 2022-03-14 – 2022-03-15 (×3): 600 mg via ORAL
  Filled 2022-03-08 (×16): qty 2

## 2022-03-08 MED ORDER — BUDESONIDE 0.5 MG/2ML IN SUSP: 0.5000 mg | Freq: Three times a day (TID) | RESPIRATORY_TRACT | Status: DC

## 2022-03-08 MED ORDER — FUROSEMIDE 10 MG/ML IJ SOLN
40.0000 mg | Freq: Once | INTRAMUSCULAR | Status: AC
  Administered 2022-03-08: 40 mg via INTRAVENOUS
  Filled 2022-03-08: qty 4

## 2022-03-08 MED ORDER — ATORVASTATIN CALCIUM 20 MG PO TABS
20.0000 mg | ORAL_TABLET | Freq: Every day | ORAL | Status: DC
  Administered 2022-03-08 – 2022-03-16 (×9): 20 mg via ORAL
  Filled 2022-03-08 (×9): qty 1

## 2022-03-08 MED ORDER — METOPROLOL SUCCINATE ER 25 MG PO TB24
25.0000 mg | ORAL_TABLET | Freq: Every day | ORAL | Status: DC
  Administered 2022-03-08 – 2022-03-12 (×5): 25 mg via ORAL
  Filled 2022-03-08 (×5): qty 1

## 2022-03-08 MED ORDER — CELECOXIB 200 MG PO CAPS
200.0000 mg | ORAL_CAPSULE | Freq: Every day | ORAL | Status: DC
  Filled 2022-03-08 (×4): qty 1

## 2022-03-08 MED ORDER — ALBUTEROL SULFATE (2.5 MG/3ML) 0.083% IN NEBU: 2.5000 mg | INHALATION_SOLUTION | Freq: Four times a day (QID) | RESPIRATORY_TRACT | Status: DC | PRN

## 2022-03-08 MED ORDER — ALBUTEROL SULFATE (2.5 MG/3ML) 0.083% IN NEBU: 2.5000 mg | INHALATION_SOLUTION | Freq: Three times a day (TID) | RESPIRATORY_TRACT | Status: DC

## 2022-03-08 MED ORDER — IPRATROPIUM-ALBUTEROL 0.5-2.5 (3) MG/3ML IN SOLN
3.0000 mL | RESPIRATORY_TRACT | Status: DC | PRN
  Filled 2022-03-08: qty 3

## 2022-03-08 MED ORDER — IPRATROPIUM-ALBUTEROL 0.5-2.5 (3) MG/3ML IN SOLN
3.0000 mL | RESPIRATORY_TRACT | Status: DC
  Administered 2022-03-08 (×4): 3 mL via RESPIRATORY_TRACT
  Filled 2022-03-08 (×4): qty 3

## 2022-03-08 NOTE — Assessment & Plan Note (Deleted)
-   Suspect COPD exacerbation related to likely viral URI as pt reports multiple sick contacts at home  ?- COVID/influenza A/influenza B PCR were negative ?- Cont duonebs q4h with q2h PRN ?- Albuterol inhaler, 2 puffs every 6 hours as needed for shortness of breath and wheezing ?- Admit to telemetry medical, observation ?

## 2022-03-08 NOTE — Progress Notes (Signed)
? ?      CROSS COVER NOTE ? ?NAME: Stacey Sandoval ?MRN: [id number] ?DOB : 05/29/1951  ? ?Patient with increasing O2 requirements. SPO2 86% requiring 5 L [name]. Patient now on 8L HFNC, oxygen saturation 97%.  ? ? ?Plan: ?- Albuterol ?- Heated high flow ?- CXR  ?- ABG ? ?ABG showed hypercapnia. pH 7.31 pCO2 65 pO2 107 Bicarb 32.7. Will place patient on bipap and upgrade to progressive for PRN bipap. ? ?Update: patient refusing BiPAP. Will keep on 8L HFNC. ? ? ?Neomia Glass MHA, MSN, FNP-BC ?Nurse Practitioner ?[facility] ?[place] ?Pager [phone] ? ?

## 2022-03-08 NOTE — ED Notes (Addendum)
Pt sleeping fowlers, on 5LPM [place] O2, arousable to touch. Pt a/ox4, states she feels the same regarding her breathing as she did yesterday with this RN. Pt has bilateral wheezes and crackles in lung fields. Pt encouraged to consider BiPap as this would be a better help than [place] O2. Pt verbalizes being too claustrophobic for bipap. Pt informed we can give medicine to help with this if she will consider trying bipap. Pt states she will think about it ?

## 2022-03-08 NOTE — ED Notes (Signed)
Patient placed on [place] at this time. 

## 2022-03-08 NOTE — ED Notes (Signed)
Pt oxygen was dipping low on a steady pleth even on 5L, respiratory was called for intervention.  ?

## 2022-03-08 NOTE — Care Management Obs Status (Signed)
MEDICARE OBSERVATION STATUS NOTIFICATION ? ? ?Patient Details  ?Name: Stacey Sandoval ?MRN: [id number] ?Date of Birth: 1951-04-22 ? ? ?Medicare Observation Status Notification Given:  Yes ? ? ? ?Allayne Butcher, RN ?03/08/2022, 12:51 PM ?

## 2022-03-08 NOTE — ED Notes (Signed)
Pt again encouraged to use bipap but pt refuses ?

## 2022-03-08 NOTE — Assessment & Plan Note (Addendum)
-   cont atorvastatin 20 mg daily

## 2022-03-08 NOTE — Progress Notes (Signed)
Admission profile updated. ?

## 2022-03-08 NOTE — Progress Notes (Signed)
?  Progress Note ? ? ?Patient: Stacey Sandoval [id number] DOB: 28-Mar-1951 DOA: 03/07/2022     0 ?DOS: the patient was seen and examined on 03/08/2022 ?  ?Brief hospital course: ?[name] is a 71 year old female insomnia, hypertension, hyperlipidemia, who presents emergency department for chief concerns of shortness of breath and cough. ? ?ED vitals show temperature of 98.6, respiration rate of 20, heart rate 102, blood pressure 136/67, SPO2 of 95% on 2 L nasal cannula. ? ?Serum sodium 127, potassium 4.2, chloride 89, bicarb 25, BUN of 13, serum creatinine of 0.62, GFR greater than 60, nonfasting blood glucose 100, WBC 7.1, hemoglobin 12.3, platelets of 287. ? ?BNP was 82.2. ? ?High sensitive troponin was 16 and was unchanged at 16 on repeat. ? ?COVID/influenza A/influenza B PCR were negative. ? ?TSH was 0.208, T4: 0.87. ? ?ED treatment: DuoNeb x1, Solu-Medrol 125 mg, azithromycin 500 mg IV, ceftriaxone 2 g IV. ? ?Assessment and Plan: ?* Shortness of breath ?- Suspect COPD exacerbation related to likely viral URI as pt reports multiple sick contacts at home  ?- COVID/influenza A/influenza B PCR were negative ?- Cont duonebs q4h with q2h PRN ?- Albuterol inhaler, 2 puffs every 6 hours as needed for shortness of breath and wheezing ?- Admit to telemetry medical, observation ? ?Pulmonary edema ?-Noted on CXR obtained while pt had increased sob while receiving 150cc/hr IVF ?-Hold further IVF ?-Will give one dose of IV lasix ?-Check 2d echo ? ?Hyponatremia ?-Appears euvolemic on exam ?-Was given 150cc/hr IVF overnight with development of increased sob and CXR findings of pulm edema, see below ?-Will hold IVF ?-Will check serum osm, urine osm, and urine Na ?-Repeat bmet in AM ? ?Hyperlipidemia ?- Resumed atorvastatin 20 mg daily ? ?Essential hypertension ?-Continue metoprolol succinate 25 mg daily ?-Home valsartan-hydrochlorothiazide 320-25 mg held due to patient currently normotensive at this time ?- BP remains  stable ? ?Insomnia ?- Patient reports that her primary care provider has tried a number of medications including trazodone, melatonin, Ambien ?- Pt reported taking Benadryl nightly and drinks 2 glasses of wine with ice  ? ? ? ? ?  ? ?Subjective: Still coughing and sob. Denies nausea ? ?Physical Exam: ?Vitals:  ? 03/08/22 1330 03/08/22 1400 03/08/22 1522 03/08/22 1707  ?BP: 110/67 (!) 96/49 (!) 125/57 (!) 124/57  ?Pulse: (!) 102 89 100 (!) 103  ?Resp: (!) 22 (!) 22 20 20   ?Temp:   97.8 ?F (36.6 ?C) 97.8 ?F (36.6 ?C)  ?TempSrc:      ?SpO2: 92% 93% 96% 96%  ?Weight:      ?Height:      ? ?General exam: Awake, laying in bed, in nad ?Respiratory system: Increased respiratory effort, decreased BS throughout, no inspiratory wheezing auscultated  ?Cardiovascular system: regular rate, s1, s2 ?Gastrointestinal system: Soft, nondistended, positive BS ?Central nervous system: CN2-12 grossly intact, strength intact ?Extremities: Perfused, no clubbing ?Skin: Normal skin turgor, no notable skin lesions seen ?Psychiatry: Mood normal // no visual hallucinations  ? ?Data Reviewed: ? ?CXR reviewed from this AM, findings of new pulm edema ? ?Family Communication: Pt in room, family not at bedside ? ?Disposition: ?Status is: Observation ?The patient will require care spanning > 2 midnights and should be moved to inpatient because: Severity of illness ? Planned Discharge Destination: Home ? ? ? ?Author: ?Marylu Lund, MD ?03/08/2022 6:14 PM ? ?For on call review [email or web address].  ?

## 2022-03-08 NOTE — Assessment & Plan Note (Addendum)
Mild, follow-up with PCP as outpatient ?

## 2022-03-08 NOTE — TOC Initial Note (Signed)
Transition of Care (TOC) - Initial/Assessment Note  ? ? ?Patient Details  ?Name: Stacey Sandoval ?MRN: [id number] ?Date of Birth: Dec 24, 1950 ? ?Transition of Care (TOC) CM/SW Contact:    ?Allayne Butcher, RN ?Phone Number: ?03/08/2022, 1:15 PM ? ?Clinical Narrative:                 ?Patient placed under observation for shortness of breath, negative for COVID and flu.  Patient is currently on [place] at 5L.  This morning her oxygen requirement increased, she did not tolerate Bipap.  RNCM attempted to speak with patient at the bedside and review MOON, patient is very lethargic.  MOON left at the bedside for patient.  Attempted to call daughter with no answer, voicemail left for return call.   ? ?Expected Discharge Plan: Home w Home Health Services ?Barriers to Discharge: Continued Medical Work up ? ? ?Patient Goals and CMS Choice ?Patient states their goals for this hospitalization and ongoing recovery are:: Patient unable to state- very lethargic ?  ?  ? ?Expected Discharge Plan and Services ?Expected Discharge Plan: Home w Home Health Services ?  ?Discharge Planning Services: CM Consult ?  ?Living arrangements for the past 2 months: Single Family Home ?                ?  ?  ?  ?  ?  ?  ?  ?  ?  ?  ? ?Prior Living Arrangements/Services ?Living arrangements for the past 2 months: Single Family Home ?Lives with:: Self ?Patient language and need for interpreter reviewed:: Yes ?       ?Need for Family Participation in Patient Care: Yes (Comment) ?Care giver support system in place?: Yes (comment) (daughter) ?  ?Criminal Activity/Legal Involvement Pertinent to Current Situation/Hospitalization: No - Comment as needed ? ?Activities of Daily Living ?Home Assistive Devices/Equipment: Cane (specify quad or straight) ?ADL Screening (condition at time of admission) ?Patient's cognitive ability adequate to safely complete daily activities?: Yes ?Is the patient deaf or have difficulty hearing?: No ?Does the patient have difficulty  seeing, even when wearing glasses/contacts?: No ?Does the patient have difficulty concentrating, remembering, or making decisions?: Yes ?Patient able to express need for assistance with ADLs?: Yes ?Does the patient have difficulty dressing or bathing?: No ?Independently performs ADLs?: Yes (appropriate for developmental age) ?Does the patient have difficulty walking or climbing stairs?: No ?Weakness of Legs: None ?Weakness of Arms/Hands: None ? ?Permission Sought/Granted ?Permission sought to share information with : Case Manager, Family Supports ?Permission granted to share information with : Yes, Verbal Permission Granted ? Share Information with NAME: Delma Post ?   ? Permission granted to share info w Relationship: daughter ? Permission granted to share info w Contact Information: [phone] ? ?Emotional Assessment ?Appearance:: Appears stated age ?Attitude/Demeanor/Rapport: Lethargic ?Affect (typically observed): Unable to Assess ?Orientation: : Oriented to Self ?Alcohol / Substance Use: Not Applicable ?Psych Involvement: No (comment) ? ?Admission diagnosis:  Shortness of breath [R06.02] ?Patient Active Problem List  ? Diagnosis Date Noted  ? Insomnia 03/08/2022  ? Essential hypertension 03/08/2022  ? Hyperlipidemia 03/08/2022  ? Shortness of breath 03/07/2022  ? ?PCP:  Practice, [facility] ?Pharmacy:   ?[id number] - [place], [place] - [address] [address] ?[address] ?[place] [address] ?Phone: [phone] Fax: [phone] ? ? ? ? ?Social Determinants of Health (SDOH) Interventions ?  ? ?Readmission Risk Interventions ?   ?  View : No data to display.  ?  ?  ?  ? ? ? ?

## 2022-03-08 NOTE — Assessment & Plan Note (Addendum)
Chronic insomnia, follow-up with PCP as outpatient. ?

## 2022-03-08 NOTE — Progress Notes (Signed)
Called to place pt on Bipap. Pt expressed her concern with claustrophobia and anxiety. Attempted to place pt on Bipap and decreased settings, pt could not tolerate Bipap. RN at bedside. Pt placed back on 8L HFNC. NP aware.  ?

## 2022-03-08 NOTE — TOC Progression Note (Signed)
Transition of Care (TOC) - Progression Note  ? ? ?Patient Details  ?Name: Stacey Sandoval ?MRN: [id number] ?Date of Birth: 10-16-1951 ? ?Transition of Care (TOC) CM/SW Contact  ?Shelbie Hutching, RN ?Phone Number: ?03/08/2022, 2:27 PM ? ?Clinical Narrative:    ? ?Daughter returned call from earlier.  Patient lives with daughter, Stacey Sandoval.  Stacey Sandoval reports that the patient lost her husband a few months ago and since then has been very depressed.  Patient walks with a walker at home.  Daughter provides all transportation. ?Daughter reports that the lethargy patient is experiencing right now is definitely not normal for her. ?Patient will cont to be monitored.   ? ?TOC will follow for needs. ? ? ?Expected Discharge Plan: [place] ?Barriers to Discharge: Continued Medical Work up ? ?Expected Discharge Plan and Services ?Expected Discharge Plan: [place] ?  ?Discharge Planning Services: CM Consult ?  ?Living arrangements for the past 2 months: [place] ?                ?  ?  ?  ?  ?  ?  ?  ?  ?  ?  ? ? ?Social Determinants of Health (SDOH) Interventions ?  ? ?Readmission Risk Interventions ?   ? View : No data to display.  ?  ?  ?  ? ? ?

## 2022-03-08 NOTE — Progress Notes (Signed)
PHARMACIST - PHYSICIAN COMMUNICATION ?  ?CONCERNING: Methylprednisolone IV  ?  ?Current order: Methylprednisolone IV 40mg  every 12 hours  ?  ?  ?DESCRIPTION: ?Per [place] Protocol:  ? ?IV methylprednisolone will be converted to either a q12h or q24h frequency with the same total daily dose (TDD). ? ?Ordered Dose: 1 to 125 mg TDD; convert to: TDD q24h.  ?Ordered Dose: 126 to 250 mg TDD; convert to: TDD div q12h.  ?Ordered Dose: >250 mg TDD; DAW. ? ?Order has been adjusted to: Methylprednisolone IV 80mg  every 24 hours  ? ? ?Pernell Dupre , PharmD, BCPS ?Clinical Pharmacist  ?03/08/2022 8:18 AM  ?

## 2022-03-08 NOTE — ED Notes (Addendum)
After order of bipap RT and RN stood in room while pt was talked through wearing the bipap mask. Pt tried the 2 different airflow settings and said "its too much air I cant handle that" and began to get anxious. NP notified.  ?

## 2022-03-08 NOTE — Assessment & Plan Note (Addendum)
We will continue beta-blocker and calcium channel blocker. ?

## 2022-03-08 NOTE — ED Notes (Signed)
Pt SPO2 70s, pt has O2 [place] off. Reapplied cannula with instructions to pt to take deep breaths. Pt also has removed her 22g right hand IV. Pt repositioned in bed, high fowlers.  ?

## 2022-03-08 NOTE — Assessment & Plan Note (Deleted)
--  developed after receiving IVF.  Diuresed well with IV lasix 40  ?Plan: ?--cont IV lasix 40 mg daily ?

## 2022-03-09 DIAGNOSIS — J9601 Acute respiratory failure with hypoxia: Secondary | ICD-10-CM

## 2022-03-09 DIAGNOSIS — J441 Chronic obstructive pulmonary disease with (acute) exacerbation: Secondary | ICD-10-CM | POA: Diagnosis not present

## 2022-03-09 LAB — ECHOCARDIOGRAM COMPLETE
Area-P 1/2: 4.39 cm2
Height: 60 in
S' Lateral: 2.5 cm
Weight: 2560 oz

## 2022-03-09 LAB — CBC
HCT: 35 % — ABNORMAL LOW (ref 36.0–46.0)
Hemoglobin: 11.4 g/dL — ABNORMAL LOW (ref 12.0–15.0)
MCH: 31.8 pg (ref 26.0–34.0)
MCHC: 32.6 g/dL (ref 30.0–36.0)
MCV: 97.8 fL (ref 80.0–100.0)
Platelets: 302 10*3/uL (ref 150–400)
RBC: 3.58 MIL/uL — ABNORMAL LOW (ref 3.87–5.11)
RDW: 11.9 % (ref 11.5–15.5)
WBC: 7.3 10*3/uL (ref 4.0–10.5)
nRBC: 0 % (ref 0.0–0.2)

## 2022-03-09 LAB — RESPIRATORY PANEL BY PCR

## 2022-03-09 LAB — COMPREHENSIVE METABOLIC PANEL
ALT: 19 U/L (ref 0–44)
AST: 20 U/L (ref 15–41)
Albumin: 3.3 g/dL — ABNORMAL LOW (ref 3.5–5.0)
Alkaline Phosphatase: 50 U/L (ref 38–126)
Anion gap: 6 (ref 5–15)
BUN: 16 mg/dL (ref 8–23)
CO2: 35 mmol/L — ABNORMAL HIGH (ref 22–32)
Calcium: 9 mg/dL (ref 8.9–10.3)
Chloride: 92 mmol/L — ABNORMAL LOW (ref 98–111)
Creatinine, Ser: 0.53 mg/dL (ref 0.44–1.00)
GFR, Estimated: >60 mL/min (ref >60)
Glucose, Bld: 95 mg/dL (ref 70–99)
Potassium: 3.7 mmol/L (ref 3.5–5.1)
Sodium: 133 mmol/L — ABNORMAL LOW (ref 135–145)
Total Bilirubin: 0.4 mg/dL (ref 0.3–1.2)
Total Protein: 6.5 g/dL (ref 6.5–8.1)

## 2022-03-09 MED ORDER — FUROSEMIDE 10 MG/ML IJ SOLN
40.0000 mg | Freq: Two times a day (BID) | INTRAMUSCULAR | Status: DC
  Administered 2022-03-09 (×2): 40 mg via INTRAVENOUS
  Filled 2022-03-09 (×2): qty 4

## 2022-03-09 MED ORDER — AZITHROMYCIN 250 MG PO TABS
500.0000 mg | ORAL_TABLET | Freq: Every day | ORAL | Status: AC
  Administered 2022-03-10 – 2022-03-11 (×2): 500 mg via ORAL
  Filled 2022-03-09 (×2): qty 2

## 2022-03-09 MED ORDER — SALINE SPRAY 0.65 % NA SOLN
1.0000 | NASAL | Status: DC | PRN
  Filled 2022-03-09: qty 44

## 2022-03-09 MED ORDER — PREDNISONE 20 MG PO TABS
40.0000 mg | ORAL_TABLET | Freq: Every day | ORAL | Status: DC
  Administered 2022-03-10 – 2022-03-11 (×2): 40 mg via ORAL
  Filled 2022-03-09 (×2): qty 2

## 2022-03-09 MED ORDER — IPRATROPIUM-ALBUTEROL 0.5-2.5 (3) MG/3ML IN SOLN
3.0000 mL | Freq: Three times a day (TID) | RESPIRATORY_TRACT | Status: DC
  Administered 2022-03-09 – 2022-03-11 (×7): 3 mL via RESPIRATORY_TRACT
  Filled 2022-03-09 (×7): qty 3

## 2022-03-09 NOTE — Progress Notes (Signed)
?  Progress Note ? ? ?Patient: Stacey Sandoval GEX:[id number] DOB: 12-24-1950 DOA: 03/07/2022     1 ?DOS: the patient was seen and examined on 03/09/2022 ?  ?Brief hospital course: ?[name] is a 71 year old female insomnia, hypertension, hyperlipidemia, who presents emergency department for chief concerns of shortness of breath and cough. ? ?ED vitals show temperature of 98.6, respiration rate of 20, heart rate 102, blood pressure 136/67, SPO2 of 95% on 2 L nasal cannula. ? ?Serum sodium 127, potassium 4.2, chloride 89, bicarb 25, BUN of 13, serum creatinine of 0.62, GFR greater than 60, nonfasting blood glucose 100, WBC 7.1, hemoglobin 12.3, platelets of 287. ? ?BNP was 82.2. ? ?High sensitive troponin was 16 and was unchanged at 16 on repeat. ? ?COVID/influenza A/influenza B PCR were negative. ? ?TSH was 0.208, T4: 0.87. ? ?ED treatment: DuoNeb x1, Solu-Medrol 125 mg, azithromycin 500 mg IV, ceftriaxone 2 g IV. ? ?Assessment and Plan: ?* COPD exacerbation (HCC) ?-no formal dx of COPD, however, given extensive smoking hx and current smoking status, likely has COPD exacerbation. ?--pos sick contacts from grandchildren. ?--RVP pos for rhinovirus ?Plan: ?--transition from IV solumedrol to prednisone 40 mg daily tomorrow ?--cont scheduled DuoNeb ?--cont azithromycin ? ?Acute hypoxemic respiratory failure (HCC) ?--due to fluid overload from IVF causing pulm edema ?--required up to 8L HFNC, weaning down with IV diuresis ?Plan: ?--Continue supplemental O2 to keep sats >=92%, wean as tolerated ? ? ? ?Pulmonary edema ?--developed after receiving IVF ?Plan: ?--cont IV lasix 40 BID ? ?Hyponatremia ?-Appears euvolemic on exam ?--trend Na ? ?Hyperlipidemia ?- cont atorvastatin 20 mg daily ? ?Essential hypertension ?-Continue metoprolol succinate 25 mg daily ?-Home valsartan-hydrochlorothiazide 320-25 mg held due to patient currently normotensive at this time ?--cont IV lasix ? ?Insomnia ?- Patient reports that her primary  care provider has tried a number of medications including trazodone, melatonin, Ambien ?- Pt reported taking Benadryl nightly and drinks 2 glasses of wine with ice  ? ? ? ? ?  ? ?Subjective:  ?Pt reported dyspnea improved.  Reported large amount of urine output. ? ?RVP pos for Rhinovirus. ? ? ?Physical Exam: ? ?Constitutional: NAD, AAOx3 ?HEENT: conjunctivae and lids normal, EOMI ?CV: No cyanosis.   ?RESP: crackles over posterior bases, on 5L ?Extremities: No effusions, edema in BLE ?SKIN: warm, dry ?Neuro: II - XII grossly intact.   ?Psych: Normal mood and affect.  Appropriate judgement and reason ? ? ?Data Reviewed: ? ?Family Communication: daughter updated at bedside today ? ?Disposition: ?Status is: Inpatient ?Remains inpatient appropriate because: hypoxia needing IV lasix ? ? Planned Discharge Destination: Home ? ? ? ?Time spent: 50 minutes ? ?Author: ?Darlin Priestly, MD ?03/09/2022 8:41 PM ? ?For on call review [email or web address].  ?

## 2022-03-09 NOTE — Assessment & Plan Note (Addendum)
Condition has improved, completed antibiotics and steroids.  Patient will need to follow-up with PCP as outpatient.  Patient currently does not have any additional bronchospasm. ?

## 2022-03-09 NOTE — Evaluation (Signed)
Physical Therapy Evaluation ?Patient Details ?Name: Stacey Sandoval ?MRN: [id number] ?DOB: Dec 03, 1951 ?Today's Date: 03/09/2022 ? ?History of Present Illness ? Patient is a 71 year old female who reported to [facility] on 03/07/22 due to cough and SOB lasting ~1 week. PMH (+) for  long history of smoking, insomnia, hypertension, and hyperlipidemia ?  ?Clinical Impression ? Physical Therapy Evaluation completed on this date. Patient tolerated session well and was agreeable to treatment. Patient was Mod I/ Independent with all ADLs and mobility prior to hospitalization, and ambulates with her L SPC. Patient reported 5/10 LBP throughout session. Patient reports she lives in a 1 story home with her daughter, that has 3 STE and no handrails (however after the first step she can grab onto the rail on her porch). Session was completed on 5L O2 via [place].  ? ?Patient demonstrated at least 3/5 strength in BLEs, however is able to complete supine to sit bed mobility at Mod I with no cueing and minimal assistance from bed rail to complete. Sit to stand from EOB and commode was completed at supervision with L SPC, and patient is able to demonstrate independence with pericare. Patient was able to demonstrate only mild unsteadiness when ambulating around the nurses station at supervision, and ascending and descending 3 steps with R HR at CGA. Steps were completed in a step to pattern. Within the room, patient was able to demonstrate ability to ambulate with her cane and navigate the O2 tank without assistance. Patient is demonstrating near baseline level of function, however would continue to benefit from skilled physical therapy in order to optimize patient's return to PLOF. Recommend HHPT upon discharge from acute hospitalization at this time.  ?   ? ?Recommendations for follow up therapy are one component of a multi-disciplinary discharge planning process, led by the attending physician.  Recommendations may be updated based on patient  status, additional functional criteria and insurance authorization. ? ?Follow Up Recommendations Home health PT ? ?  ?Assistance Recommended at Discharge PRN  ?Patient can return home with the following ? Help with stairs or ramp for entrance;A little help with walking and/or transfers ? ?  ?Equipment Recommendations None recommended by PT  ?Recommendations for Other Services ?    ?  ?Functional Status Assessment Patient has had a recent decline in their functional status and demonstrates the ability to make significant improvements in function in a reasonable and predictable amount of time.  ? ?  ?Precautions / Restrictions Precautions ?Precautions: Fall ?Restrictions ?Weight Bearing Restrictions: No  ? ?  ? ?Mobility ? Bed Mobility ?Overal bed mobility: Modified Independent ?Bed Mobility: Supine to Sit ?  ?  ?Supine to sit: Modified independent (Device/Increase time) ?  ?  ?General bed mobility comments: minimal use of bed rail to come to sitting, no physical assistance required ?Patient Response: Cooperative ? ?Transfers ?Overall transfer level: Needs assistance ?Equipment used: Straight cane (Left) ?Transfers: Sit to/from Stand ?Sit to Stand: Supervision ?  ?  ?  ?  ?  ?  ?  ? ?Ambulation/Gait ?Ambulation/Gait assistance: Supervision ?Gait Distance (Feet): 280 Feet ?Assistive device: Straight cane (Left) ?  ?Gait velocity: mildly decreased ?  ?  ?General Gait Details: mild unsteadiness, however no LOB noted, HR ranged from 109-118bpm with functional activity, O2 remained >90% on 5L of via [place] during ambulation ? ?Stairs ?Stairs: Yes ?Stairs assistance: Min guard ?Stair Management: One rail Right, With cane ?Number of Stairs: 3 ?General stair comments: mild unsteadiness ? ?Wheelchair Mobility ?  ? ?  Modified Rankin (Stroke Patients Only) ?  ? ?  ? ?Balance Overall balance assessment: Needs assistance ?Sitting-balance support: No upper extremity supported, Feet supported ?Sitting balance-Leahy Scale: Good ?  ?   ?Standing balance support: Single extremity supported, During functional activity, Reliant on assistive device for balance ?Standing balance-Leahy Scale: Good ?Standing balance comment: mild unsteadiness with ambulation, good static standing- able to reach outside BOS to take a sip of her coffee in standing ?  ?  ?  ?  ?  ?  ?  ?  ?  ?  ?  ?   ? ? ? ?Pertinent Vitals/Pain Pain Assessment ?Pain Assessment: 0-10 ?Pain Score: 5  ?Pain Location: back pain ?Pain Descriptors / Indicators: Aching, Discomfort, Dull ?Pain Intervention(s): Limited activity within patient's tolerance, Monitored during session, Repositioned  ? ? ?Home Living Family/patient expects to be discharged to:: Private residence ?Living Arrangements: Children (daughter) ?Available Help at Discharge: Family ?Type of Home: House ?Home Access: Stairs to enter ?Entrance Stairs-Rails: None ?Entrance Stairs-Number of Steps: 3 ?  ?Home Layout: One level ?Home Equipment: Cane - single point;Grab bars - tub/shower;Shower seat ?   ?  ?Prior Function Prior Level of Function : Independent/Modified Independent ?  ?  ?  ?  ?  ?  ?Mobility Comments: Mod I with L SPC ?ADLs Comments: Independent ?  ? ? ?Hand Dominance  ? Dominant Hand: Right ? ?  ?Extremity/Trunk Assessment  ? Upper Extremity Assessment ?Upper Extremity Assessment: Generalized weakness ?  ? ?Lower Extremity Assessment ?Lower Extremity Assessment: Generalized weakness (at least 3/5 strength bilatearlly) ?  ? ?   ?Communication  ? Communication: No difficulties  ?Cognition Arousal/Alertness: Awake/alert ?Behavior During Therapy: [facility] for tasks assessed/performed ?Overall Cognitive Status: Within Functional Limits for tasks assessed ?  ?  ?  ?  ?  ?  ?  ?  ?  ?  ?  ?  ?  ?  ?  ?  ?General Comments: A&Ox2 self and location, disoriented to situation ?  ?  ? ?  ?General Comments General comments (skin integrity, edema, etc.): SpO2 ranged from 92-98% on 5L via [place], HR 109-118bpm ? ?  ?Exercises Other  Exercises ?Other Exercises: patient educated on role of PT in acute setting, fall risk, and d/c recommendations  ? ?Assessment/Plan  ?  ?PT Assessment Patient needs continued PT services  ?PT Problem List Decreased strength;Decreased balance;Decreased activity tolerance;Decreased safety awareness;Pain ? ?   ?  ?PT Treatment Interventions Therapeutic activities;Gait training;Stair training;Therapeutic exercise;Balance training   ? ?PT Goals (Current goals can be found in the Care Plan section)  ?Acute Rehab PT Goals ?Patient Stated Goal: to go home ?PT Goal Formulation: With patient ?Time For Goal Achievement: 03/23/22 ?Potential to Achieve Goals: Good ? ?  ?Frequency Min 2X/week ?  ? ? ?Co-evaluation   ?  ?  ?  ?  ? ? ?  ?AM-PAC PT "6 Clicks" Mobility  ?Outcome Measure Help needed turning from your back to your side while in a flat bed without using bedrails?: A Little ?Help needed moving from lying on your back to sitting on the side of a flat bed without using bedrails?: A Little ?Help needed moving to and from a bed to a chair (including a wheelchair)?: A Little ?Help needed standing up from a chair using your arms (e.g., wheelchair or bedside chair)?: A Little ?Help needed to walk in hospital room?: A Little ?Help needed climbing 3-5 steps with a railing? : A Little ?6  Click Score: 18 ? ?  ?End of Session Equipment Utilized During Treatment: Gait belt;Oxygen ?Activity Tolerance: Patient tolerated treatment well;No increased pain ?Patient left: in chair;with call bell/phone within reach;with chair alarm set ?Nurse Communication: Mobility status ?PT Visit Diagnosis: Unsteadiness on feet (R26.81);Muscle weakness (generalized) (M62.81) ?  ? ?Time: 4627-0350 ?PT Time Calculation (min) (ACUTE ONLY): 30 min ? ? ?Charges:   PT Evaluation ?$PT Eval Low Complexity: 1 Low ?PT Treatments ?$Therapeutic Activity: 8-22 mins ?  ?   ? ? ?Angelica Ran, PT  ?03/09/22. 10:23 AM ? ? ?

## 2022-03-09 NOTE — Assessment & Plan Note (Addendum)
Condition is improving.  Patient has good saturation on 2 L oxygen, will obtain home oxygen evaluation ? ? ?

## 2022-03-10 DIAGNOSIS — J441 Chronic obstructive pulmonary disease with (acute) exacerbation: Secondary | ICD-10-CM | POA: Diagnosis not present

## 2022-03-10 DIAGNOSIS — E669 Obesity, unspecified: Secondary | ICD-10-CM

## 2022-03-10 DIAGNOSIS — F17200 Nicotine dependence, unspecified, uncomplicated: Secondary | ICD-10-CM

## 2022-03-10 DIAGNOSIS — B348 Other viral infections of unspecified site: Secondary | ICD-10-CM

## 2022-03-10 LAB — CBC
HCT: 38.9 % (ref 36.0–46.0)
Hemoglobin: 12.6 g/dL (ref 12.0–15.0)
MCH: 32 pg (ref 26.0–34.0)
MCHC: 32.4 g/dL (ref 30.0–36.0)
MCV: 98.7 fL (ref 80.0–100.0)
Platelets: 359 10*3/uL (ref 150–400)
RBC: 3.94 MIL/uL (ref 3.87–5.11)
RDW: 11.9 % (ref 11.5–15.5)
WBC: 9.4 10*3/uL (ref 4.0–10.5)
nRBC: 0 % (ref 0.0–0.2)

## 2022-03-10 LAB — MAGNESIUM: Magnesium: 1.7 mg/dL (ref 1.7–2.4)

## 2022-03-10 LAB — BASIC METABOLIC PANEL
Anion gap: 10 (ref 5–15)
BUN: 18 mg/dL (ref 8–23)
CO2: 36 mmol/L — ABNORMAL HIGH (ref 22–32)
Calcium: 9.3 mg/dL (ref 8.9–10.3)
Chloride: 89 mmol/L — ABNORMAL LOW (ref 98–111)
Creatinine, Ser: 0.66 mg/dL (ref 0.44–1.00)
GFR, Estimated: >60 mL/min (ref >60)
Glucose, Bld: 131 mg/dL — ABNORMAL HIGH (ref 70–99)
Potassium: 3.2 mmol/L — ABNORMAL LOW (ref 3.5–5.1)
Sodium: 135 mmol/L (ref 135–145)

## 2022-03-10 MED ORDER — FUROSEMIDE 40 MG PO TABS
80.0000 mg | ORAL_TABLET | Freq: Every day | ORAL | Status: DC
  Administered 2022-03-10 – 2022-03-11 (×2): 80 mg via ORAL
  Filled 2022-03-10 (×2): qty 2

## 2022-03-10 MED ORDER — POTASSIUM CHLORIDE CRYS ER 20 MEQ PO TBCR
40.0000 meq | EXTENDED_RELEASE_TABLET | Freq: Once | ORAL | Status: AC
  Administered 2022-03-10: 40 meq via ORAL
  Filled 2022-03-10: qty 2

## 2022-03-10 NOTE — Assessment & Plan Note (Signed)
BMI 31.25 ?

## 2022-03-10 NOTE — Progress Notes (Signed)
Okay to leave IV out ?

## 2022-03-10 NOTE — Assessment & Plan Note (Addendum)
Source of COPD exacerbation, condition had improved. ?

## 2022-03-10 NOTE — Progress Notes (Addendum)
?  Progress Note ? ? ?Patient: Stacey Sandoval:[id number] DOB: 08-07-1951 DOA: 03/07/2022     2 ?DOS: the patient was seen and examined on 03/10/2022 ?  ?Brief hospital course: ?[name] is a 71 year old female insomnia, hypertension, hyperlipidemia, who presents emergency department for chief concerns of shortness of breath and cough. ? ?ED vitals show temperature of 98.6, respiration rate of 20, heart rate 102, blood pressure 136/67, SPO2 of 95% on 2 L nasal cannula. ? ?Serum sodium 127, potassium 4.2, chloride 89, bicarb 25, BUN of 13, serum creatinine of 0.62, GFR greater than 60, nonfasting blood glucose 100, WBC 7.1, hemoglobin 12.3, platelets of 287. ? ?BNP was 82.2. ? ?High sensitive troponin was 16 and was unchanged at 16 on repeat. ? ?COVID/influenza A/influenza B PCR were negative. ? ?TSH was 0.208, T4: 0.87. ? ?ED treatment: DuoNeb x1, Solu-Medrol 125 mg, azithromycin 500 mg IV, ceftriaxone 2 g IV. ? ?Assessment and Plan: ?* COPD exacerbation (HCC) ?-no formal dx of COPD, however, given extensive smoking hx and current smoking status, likely has COPD exacerbation. ?--pos sick contacts from grandchildren. ?--RVP pos for rhinovirus ?Plan: ?--cont prednisone 40 mg daily  ?--cont scheduled DuoNeb ?--cont azithromycin ? ?Obesity (BMI 30-39.9) ?BMI 31.25 ? ?Current every day smoker ?--pt expressed readiness to quit smoking ? ?Rhinovirus infection ?--had pos sick contact from grandchildren.  Likely the trigger of her COPD exacerbation and dyspnea ? ?Acute hypoxemic respiratory failure (HCC) ?--due to fluid overload from IVF causing pulm edema ?--required up to 8L HFNC, weaning down with IV diuresis ?Plan: ?--Continue supplemental O2 to keep sats >=92%, wean as tolerated ? ? ? ?Acute pulmonary edema (HCC) ?--developed after receiving IVF.  Diuresed well with IV lasix 40 BID ?Plan: ?--oral lasix 80 mg today ? ?Hyponatremia ?-Appears euvolemic on exam ?--trend Na ? ?Hyperlipidemia ?- cont atorvastatin 20  mg daily ? ?Essential hypertension ?-Continue metoprolol succinate 25 mg daily ?-Home valsartan-hydrochlorothiazide 320-25 mg held due to patient currently normotensive at this time ?--cont oral lasix ? ?Insomnia ?- Patient reports that her primary care provider has tried a number of medications including trazodone, melatonin, Ambien ?- Pt reported taking Benadryl nightly and drinks 2 glasses of wine with ice  ? ? ? ? ?  ? ?Subjective:  ?Dyspnea continued to improve.   ? ? ?Physical Exam: ? ?Constitutional: NAD, AAOx3, sitting in recliner ?HEENT: conjunctivae and lids normal, EOMI ?CV: No cyanosis.   ?RESP: normal respiratory effort, on RA ?Extremities: No effusions, edema in BLE ?SKIN: warm, dry ?Neuro: II - XII grossly intact.   ?Psych: subdued mood and affect.   ? ? ?Data Reviewed: ? ?Family Communication:  ? ?Disposition: ?Status is: Inpatient ? ? Planned Discharge Destination: Home ? ? ? ?Time spent: 35 minutes ? ?Author: ?Darlin Priestly, MD ?03/10/2022 9:23 PM ? ?For on call review [email or web address]  ?

## 2022-03-10 NOTE — TOC Progression Note (Addendum)
Transition of Care (TOC) - Progression Note  ? ? ?Patient Details  ?Name: Stacey Sandoval ?MRN: [id number] ?Date of Birth: 1950-12-22 ? ?Transition of Care (TOC) CM/SW Contact  ?Candie Chroman, LCSW ?Phone Number: ?03/10/2022, 4:29 PM ? ?Clinical Narrative: Met with patient, introduced role, and explained that PT recommendations would be discussed. Patient is agreeable to home health. [facility] is first preference but they cannot accept referral. Will check with other agencies that service her zip code. [facility], [facility], and [facility] are unable to accept. [facility] are checking. Left messages for [facility] and [facility]. Discussed potential for outpatient PT if we cannot get home health. Patient is on acute oxygen. Will follow for this need as well. ? ?4:52 pm: [facility], [facility], and [facility] are unable to accept referral. ? ?5:02 pm: [facility] is reviewing referral. ? ?Expected Discharge Plan: [place] ?Barriers to Discharge: Continued Medical Work up ? ?Expected Discharge Plan and Services ?Expected Discharge Plan: [place] ?  ?Discharge Planning Services: CM Consult ?  ?Living arrangements for the past 2 months: [place] ?                ?  ?  ?  ?  ?  ?  ?  ?  ?  ?  ? ? ?Social Determinants of Health (SDOH) Interventions ?  ? ?Readmission Risk Interventions ?   ? View : No data to display.  ?  ?  ?  ? ? ?

## 2022-03-10 NOTE — Assessment & Plan Note (Addendum)
Advised to quit smoking, she has not been smoking since a week ago ?

## 2022-03-11 ENCOUNTER — Other Ambulatory Visit: Payer: Self-pay

## 2022-03-11 DIAGNOSIS — J441 Chronic obstructive pulmonary disease with (acute) exacerbation: Secondary | ICD-10-CM | POA: Diagnosis not present

## 2022-03-11 DIAGNOSIS — I4891 Unspecified atrial fibrillation: Secondary | ICD-10-CM

## 2022-03-11 LAB — BASIC METABOLIC PANEL
Anion gap: 7 (ref 5–15)
BUN: 20 mg/dL (ref 8–23)
CO2: 40 mmol/L — ABNORMAL HIGH (ref 22–32)
Calcium: 9.3 mg/dL (ref 8.9–10.3)
Chloride: 92 mmol/L — ABNORMAL LOW (ref 98–111)
Creatinine, Ser: 0.66 mg/dL (ref 0.44–1.00)
GFR, Estimated: >60 mL/min (ref >60)
Glucose, Bld: 95 mg/dL (ref 70–99)
Potassium: 3.7 mmol/L (ref 3.5–5.1)
Sodium: 139 mmol/L (ref 135–145)

## 2022-03-11 LAB — CBC
HCT: 40.8 % (ref 36.0–46.0)
Hemoglobin: 13.2 g/dL (ref 12.0–15.0)
MCH: 32 pg (ref 26.0–34.0)
MCHC: 32.4 g/dL (ref 30.0–36.0)
MCV: 99 fL (ref 80.0–100.0)
Platelets: 365 10*3/uL (ref 150–400)
RBC: 4.12 MIL/uL (ref 3.87–5.11)
RDW: 12.1 % (ref 11.5–15.5)
WBC: 7.8 10*3/uL (ref 4.0–10.5)
nRBC: 0 % (ref 0.0–0.2)

## 2022-03-11 LAB — MAGNESIUM: Magnesium: 1.7 mg/dL (ref 1.7–2.4)

## 2022-03-11 MED ORDER — FUROSEMIDE 40 MG PO TABS: 80.0000 mg | ORAL_TABLET | Freq: Two times a day (BID) | ORAL | Status: DC

## 2022-03-11 MED ORDER — DILTIAZEM HCL 25 MG/5ML IV SOLN
5.0000 mg | Freq: Once | INTRAVENOUS | Status: AC
  Administered 2022-03-11: 5 mg via INTRAVENOUS

## 2022-03-11 MED ORDER — IPRATROPIUM BROMIDE 0.02 % IN SOLN
0.5000 mg | Freq: Three times a day (TID) | RESPIRATORY_TRACT | Status: DC
  Administered 2022-03-11 – 2022-03-16 (×16): 0.5 mg via RESPIRATORY_TRACT
  Filled 2022-03-11 (×17): qty 2.5

## 2022-03-11 MED ORDER — DILTIAZEM HCL 30 MG PO TABS
60.0000 mg | ORAL_TABLET | Freq: Four times a day (QID) | ORAL | Status: DC
  Administered 2022-03-11 – 2022-03-12 (×3): 60 mg via ORAL
  Filled 2022-03-11 (×3): qty 2

## 2022-03-11 MED ORDER — ALBUTEROL SULFATE (2.5 MG/3ML) 0.083% IN NEBU: 2.5000 mg | INHALATION_SOLUTION | Freq: Four times a day (QID) | RESPIRATORY_TRACT | 2 refills | Status: AC | PRN

## 2022-03-11 MED ORDER — DILTIAZEM HCL 25 MG/5ML IV SOLN
5.0000 mg | Freq: Once | INTRAVENOUS | Status: AC
  Administered 2022-03-11: 5 mg via INTRAVENOUS
  Filled 2022-03-11: qty 5

## 2022-03-11 MED ORDER — METOPROLOL SUCCINATE ER 25 MG PO TB24
25.0000 mg | ORAL_TABLET | Freq: Once | ORAL | Status: AC
  Administered 2022-03-11: 25 mg via ORAL
  Filled 2022-03-11: qty 1

## 2022-03-11 MED ORDER — LEVALBUTEROL HCL 0.63 MG/3ML IN NEBU
0.6300 mg | INHALATION_SOLUTION | Freq: Three times a day (TID) | RESPIRATORY_TRACT | Status: DC
  Administered 2022-03-11 – 2022-03-16 (×16): 0.63 mg via RESPIRATORY_TRACT
  Filled 2022-03-11 (×16): qty 3

## 2022-03-11 MED ORDER — DILTIAZEM HCL 25 MG/5ML IV SOLN
INTRAVENOUS | Status: AC
  Administered 2022-03-11: 25 mg
  Filled 2022-03-11: qty 5

## 2022-03-11 NOTE — Progress Notes (Signed)
PT Cancellation Note ? ?Patient Details ?Name: Stacey Sandoval ?MRN: [id number] ?DOB: 1951/07/08 ? ? ?Cancelled Treatment:    Reason Eval/Treat Not Completed: Medical issues which prohibited therapy Patient chart reviewed. Prior to entering room spoke with RN. Due to patients low BP and HR at rest in 140's, will hold on PT at this time, and will re-attempt at a later time/date as available and patient medically appropriate for PT. Thank you! ? ? ? ?Angelica Ran, PT  ?03/11/22. 10:05 AM ? ?

## 2022-03-11 NOTE — TOC Progression Note (Signed)
Transition of Care (TOC) - Progression Note  ? ? ?Patient Details  ?Name: Stacey Sandoval ?MRN: [id number] ?Date of Birth: Oct 30, 1951 ? ?Transition of Care (TOC) CM/SW Contact  ?Candie Chroman, LCSW ?Phone Number: ?03/11/2022, 8:29 AM ? ?Clinical Narrative:   [facility] can accept referral for PT and RN. ? ?Expected Discharge Plan: [place] ?Barriers to Discharge: Continued Medical Work up ? ?Expected Discharge Plan and Services ?Expected Discharge Plan: [place] ?  ?Discharge Planning Services: CM Consult ?  ?Living arrangements for the past 2 months: [place] ?Expected Discharge Date: 03/11/22               ?  ?  ?  ?  ?  ?  ?  ?  ?  ?  ? ? ?Social Determinants of Health (SDOH) Interventions ?  ? ?Readmission Risk Interventions ?   ? View : No data to display.  ?  ?  ?  ? ? ?

## 2022-03-11 NOTE — Progress Notes (Signed)
?  Progress Note ? ? ?Patient: Stacey Sandoval [id number] DOB: 11-04-1951 DOA: 03/07/2022     3 ?DOS: the patient was seen and examined on 03/11/2022 ?  ?Brief hospital course: ?Ms. Dyanne Yorks is a 71 year old female insomnia, hypertension, hyperlipidemia, who presents emergency department for chief concerns of shortness of breath and cough. ? ?ED vitals show temperature of 98.6, respiration rate of 20, heart rate 102, blood pressure 136/67, SPO2 of 95% on 2 L nasal cannula. ? ?Serum sodium 127, potassium 4.2, chloride 89, bicarb 25, BUN of 13, serum creatinine of 0.62, GFR greater than 60, nonfasting blood glucose 100, WBC 7.1, hemoglobin 12.3, platelets of 287. ? ?BNP was 82.2. ? ?High sensitive troponin was 16 and was unchanged at 16 on repeat. ? ?COVID/influenza A/influenza B PCR were negative. ? ?TSH was 0.208, T4: 0.87. ? ?ED treatment: DuoNeb x1, Solu-Medrol 125 mg, azithromycin 500 mg IV, ceftriaxone 2 g IV. ? ?Assessment and Plan: ?* COPD exacerbation (HCC) ?-no formal dx of COPD, however, given extensive smoking hx and current smoking status, likely has COPD exacerbation. ?--pos sick contacts from grandchildren. ?--RVP pos for rhinovirus ?Plan: ?--cont prednisone 40 mg daily  ?--cont azithromycin ?--change nebs to xopenex and Atrovent to avoid tachycardia ? ?Atrial fibrillation with rapid ventricular response (HCC) ?--went into Afib w RVR, HR up to 140's this morning. ?--s/p IV dilt 5 mg x2 ?Plan: ?--oral dilt 60 mg q6h  ? ?Obesity (BMI 30-39.9) ?BMI 31.25 ? ?Current every day smoker ?--pt expressed readiness to quit smoking ? ?Rhinovirus infection ?--had pos sick contact from grandchildren.  Likely the trigger of her COPD exacerbation and dyspnea ? ?Acute hypoxemic respiratory failure (HCC) ?--due to fluid overload from IVF causing pulm edema ?--required up to 8L HFNC, weaning down with IV diuresis ?Plan: ?--Continue supplemental O2 to keep sats >=92%, wean as tolerated ? ? ? ?Acute pulmonary edema  (HCC) ?--developed after receiving IVF.  Diuresed well with IV lasix 40 BID ?Plan: ?--hold diuretic for now due to Afib RVR and soft BP ? ?Hyponatremia ?-Appears euvolemic on exam ?--trend Na ? ?Hyperlipidemia ?- cont atorvastatin 20 mg daily ? ?Essential hypertension ?-Continue metoprolol succinate 25 mg daily ?-Home valsartan-hydrochlorothiazide 320-25 mg held due to patient currently normotensive at this time ?--hold lasix ?--dilt 60 mg q6h for Afib RVR ? ?Insomnia ?- Patient reports that her primary care provider has tried a number of medications including trazodone, melatonin, Ambien ?- Pt reported taking Benadryl nightly and drinks 2 glasses of wine with ice  ? ? ? ? ?  ? ?Subjective:  ?O2 sats dropped to 88% on room air.  Pt went into afib w RVR this morning, and complained of dizziness.   ? ? ?Physical Exam: ? ?Constitutional: NAD, AAOx3 ?HEENT: conjunctivae and lids normal, EOMI ?CV: No cyanosis.   ?RESP: normal respiratory effort, on 2L ?Extremities: No effusions, edema in BLE ?SKIN: warm, dry ?Neuro: II - XII grossly intact.   ?Psych: subdued mood and affect.   ? ? ?Data Reviewed: ? ?Family Communication: daughter updated at bedside today ? ?Disposition: ?Status is: Inpatient ? ? Planned Discharge Destination: Home ? ? ? ?Time spent: 50 minutes ? ?Author: ?Darlin Priestly, MD ?03/11/2022 6:28 PM ? ?For on call review [email or web address]  ?

## 2022-03-11 NOTE — Progress Notes (Signed)
Went in to assess patient and she was sitting on side of bed stating that she felt lightheaded and dizzy and just didn't feel good. Instructed her to lay back in bed. After lying down checked vital signs and heart rate is afib 150's and bp is soft. Red MEWS noted and notified dr Billie Ruddy of the above. EKG done and placed on chart. Dr. Billie Ruddy in and ordered 5 mg IV cardizem which was given. Her HR did come down with the cardizem to 70-90 but she is still in a fib. Will continue to monitor. ?

## 2022-03-11 NOTE — Care Management Important Message (Signed)
Important Message ? ?Patient Details  ?Name: Stacey Sandoval ?MRN: [id number] ?Date of Birth: 05-May-1951 ? ? ?Medicare Important Message Given:  Other (see comment) ? ?Attempted to reach patient via room phone to review Medicare IM due to isolation status.  Unable to reach upon attempt..   ? ? ? ?[name] ?03/11/2022, 2:29 PM ?

## 2022-03-11 NOTE — Assessment & Plan Note (Addendum)
Patient had a new onset atrial fibrillation.  Condition had improved, currently in sinus.  Continue Eliquis started in the hospital.  Follow-up with cardiology as outpatient ?

## 2022-03-12 DIAGNOSIS — J441 Chronic obstructive pulmonary disease with (acute) exacerbation: Secondary | ICD-10-CM | POA: Diagnosis not present

## 2022-03-12 LAB — CBC
HCT: 40.5 % (ref 36.0–46.0)
Hemoglobin: 12.9 g/dL (ref 12.0–15.0)
MCH: 31.7 pg (ref 26.0–34.0)
MCHC: 31.9 g/dL (ref 30.0–36.0)
MCV: 99.5 fL (ref 80.0–100.0)
Platelets: 347 10*3/uL (ref 150–400)
RBC: 4.07 MIL/uL (ref 3.87–5.11)
RDW: 12 % (ref 11.5–15.5)
WBC: 8.7 10*3/uL (ref 4.0–10.5)
nRBC: 0 % (ref 0.0–0.2)

## 2022-03-12 LAB — MAGNESIUM: Magnesium: 1.6 mg/dL — ABNORMAL LOW (ref 1.7–2.4)

## 2022-03-12 LAB — BASIC METABOLIC PANEL
Anion gap: 10 (ref 5–15)
BUN: 19 mg/dL (ref 8–23)
CO2: 37 mmol/L — ABNORMAL HIGH (ref 22–32)
Calcium: 9.4 mg/dL (ref 8.9–10.3)
Chloride: 89 mmol/L — ABNORMAL LOW (ref 98–111)
Creatinine, Ser: 0.79 mg/dL (ref 0.44–1.00)
GFR, Estimated: >60 mL/min (ref >60)
Glucose, Bld: 97 mg/dL (ref 70–99)
Potassium: 4.2 mmol/L (ref 3.5–5.1)
Sodium: 136 mmol/L (ref 135–145)

## 2022-03-12 LAB — CULTURE, BLOOD (ROUTINE X 2)
Culture: NO GROWTH
Special Requests: ADEQUATE

## 2022-03-12 MED ORDER — MAGNESIUM SULFATE 2 GM/50ML IV SOLN
2.0000 g | Freq: Once | INTRAVENOUS | Status: AC
  Administered 2022-03-12: 2 g via INTRAVENOUS
  Filled 2022-03-12: qty 50

## 2022-03-12 MED ORDER — DILTIAZEM HCL 30 MG PO TABS
30.0000 mg | ORAL_TABLET | Freq: Four times a day (QID) | ORAL | Status: DC
  Administered 2022-03-12 – 2022-03-13 (×4): 30 mg via ORAL
  Filled 2022-03-12 (×4): qty 1

## 2022-03-12 MED ORDER — METOPROLOL SUCCINATE ER 50 MG PO TB24: 50.0000 mg | ORAL_TABLET | Freq: Every day | ORAL | Status: DC

## 2022-03-12 MED ORDER — FUROSEMIDE 10 MG/ML IJ SOLN
40.0000 mg | Freq: Once | INTRAMUSCULAR | Status: AC
Start: 2022-03-12 — End: 2022-03-12
  Administered 2022-03-12: 40 mg via INTRAVENOUS
  Filled 2022-03-12: qty 4

## 2022-03-12 MED ORDER — METOPROLOL SUCCINATE ER 25 MG PO TB24
25.0000 mg | ORAL_TABLET | Freq: Once | ORAL | Status: AC
  Administered 2022-03-12: 25 mg via ORAL
  Filled 2022-03-12: qty 1

## 2022-03-12 NOTE — Progress Notes (Signed)
SATURATION QUALIFICATIONS: (This note is used to comply with regulatory documentation for home oxygen) ? ?Patient Saturations on Room Air at Rest = 83% ? ?Patient Saturations on Room Air while Ambulating = n/a% ? ?Patient Saturations on 2 Liters of oxygen while Ambulating = 92% ? ?Please briefly explain why patient needs home oxygen: ?

## 2022-03-12 NOTE — Progress Notes (Signed)
?  Progress Note ? ? ?Patient: Stacey Sandoval [id number] DOB: November 05, 1951 DOA: 03/07/2022     4 ?DOS: the patient was seen and examined on 03/12/2022 ?  ?Brief hospital course: ?[name] is a 71 year old female insomnia, hypertension, hyperlipidemia, who presents emergency department for chief concerns of shortness of breath and cough. ? ?ED vitals show temperature of 98.6, respiration rate of 20, heart rate 102, blood pressure 136/67, SPO2 of 95% on 2 L nasal cannula. ? ?Serum sodium 127, potassium 4.2, chloride 89, bicarb 25, BUN of 13, serum creatinine of 0.62, GFR greater than 60, nonfasting blood glucose 100, WBC 7.1, hemoglobin 12.3, platelets of 287. ? ?BNP was 82.2. ? ?High sensitive troponin was 16 and was unchanged at 16 on repeat. ? ?COVID/influenza A/influenza B PCR were negative. ? ?TSH was 0.208, T4: 0.87. ? ?ED treatment: DuoNeb x1, Solu-Medrol 125 mg, azithromycin 500 mg IV, ceftriaxone 2 g IV. ? ?Assessment and Plan: ?* COPD exacerbation (HCC) ?-no formal dx of COPD, however, given extensive smoking hx and current smoking status, likely has COPD exacerbation. ?--pos sick contacts from grandchildren. ?--RVP pos for rhinovirus ?--finished prednisone burst and azithromycin ?Plan: ?--cont nebs as xopenex and Atrovent to avoid tachycardia ? ?Atrial fibrillation with rapid ventricular response (HCC) ?--went into Afib w RVR, HR up to 140's morning of 3/31.  s/p IV dilt 5 mg x2 f/b oral dilt ?Plan: ?--decrease oral dilt to 30 mg q6h ?--increase home Toprol to 50 mg daily ? ?Obesity (BMI 30-39.9) ?BMI 31.25 ? ?Current every day smoker ?--pt expressed readiness to quit smoking ? ?Rhinovirus infection ?--had pos sick contact from grandchildren.  Likely the trigger of her COPD exacerbation and dyspnea ? ?Acute hypoxemic respiratory failure (HCC) ?--due to fluid overload from IVF causing pulm edema.  Also COPD exacerbation. ?--required up to 8L HFNC, weaning down with IV diuresis ?--currently still needs  2L at rest ?Plan: ?--Continue supplemental O2 to keep sats >=92%, wean as tolerated ? ? ? ?Acute pulmonary edema (HCC) ?--developed after receiving IVF.  Diuresed well with IV lasix 40 BID ?Plan: ?--IV lasix 40 x1 today ? ?Hyponatremia ?-Appears euvolemic on exam ?--trend Na ? ?Hyperlipidemia ?- cont atorvastatin 20 mg daily ? ?Essential hypertension ?--increase metoprolol succinate to 50 mg daily ?-Home valsartan-hydrochlorothiazide 320-25 mg held due to patient currently normotensive at this time ?--IV lasix 40 x1 today ?--reduce dilt to 30 mg q6h for Afib RVR ? ?Insomnia ?- Patient reports that her primary care provider has tried a number of medications including trazodone, melatonin, Ambien ?- Pt reported taking Benadryl nightly and drinks 2 glasses of wine with ice  ? ? ? ? ?  ? ?Subjective:  ?Pt continued to desat on room air.  HR better controlled. ? ? ?Physical Exam: ? ?Constitutional: NAD, AAOx3 ?HEENT: conjunctivae and lids normal, EOMI ?CV: No cyanosis.   ?RESP: normal respiratory effort, on 2L ?Extremities: No effusions, edema in BLE ?SKIN: warm, dry ?Neuro: II - XII grossly intact.   ?Psych: subdued mood and affect.  Appropriate judgement and reason ? ? ? ?Data Reviewed: ? ?Family Communication:  ? ?Disposition: ?Status is: Inpatient ? ? Planned Discharge Destination: Home ? ? ? ?Time spent: 50 minutes ? ?Author: ?Darlin Priestly, MD ?03/12/2022 3:45 PM ? ?For on call review [email or web address]  ?

## 2022-03-12 NOTE — Progress Notes (Signed)
Physical Therapy Treatment ?Patient Details ?Name: Stacey Sandoval ?MRN: [id number] ?DOB: Mar 31, 1951 ?Today's Date: 03/12/2022 ? ? ?History of Present Illness Patient is a 71 year old female who reported to [facility] on [facility]/07[facility] due to cough and SOB lasting ~1 week. PMH (+) for  long history of smoking, insomnia, hypertension, and hyperlipidemia ? ?  ?PT Comments  ? ? Patient tolerated session well and was agreeable to treatment. Upon entering room patient was resting in bed with HOB slightly elevated. No pain reported throughout session. Patient continues to demonstrate Mod I with all bed mobility, and supervision with all sit to stand transfers and ambulation. EOB and standing therapeutic exercises focused on BLE strengthening and balance (Standing marches, LAQ, and sit to stands). Patient was then able to ambulate 2 laps around the nurses station with L SPC at supervision with no LOB noted. Patient is progressing towards her goals and would continue to benefit from skilled physical therapy in order to optimize patient's return to PLOF. Continue to recommend HHPT upon discharge from acute hospitalization.   ?  ?Recommendations for follow up therapy are one component of a multi-disciplinary discharge planning process, led by the attending physician.  Recommendations may be updated based on patient status, additional functional criteria and insurance authorization. ? ?Follow Up Recommendations ? Home health PT ?  ?  ?Assistance Recommended at Discharge PRN  ?Patient can return home with the following Help with stairs or ramp for entrance;A little help with walking and/or transfers ?  ?Equipment Recommendations ? None recommended by PT  ?  ?Recommendations for Other Services   ? ? ?  ?Precautions / Restrictions Precautions ?Precautions: Fall ?Restrictions ?Weight Bearing Restrictions: No  ?  ? ?Mobility ? Bed Mobility ?Overal bed mobility: Modified Independent ?Bed Mobility: Supine to Sit, Sit to Supine ?  ?  ?Supine to sit:  Modified independent (Device/Increase time) ?Sit to supine: Modified independent (Device/Increase time) ?  ?General bed mobility comments: minimal use of bed rail to come to sitting, no physical assistance required ?  ? ?Transfers ?Overall transfer level: Needs assistance ?Equipment used: Straight cane (left side) ?Transfers: Sit to/from Stand ?Sit to Stand: Supervision ?  ?  ?  ?  ?  ?  ?  ? ?Ambulation/Gait ?Ambulation/Gait assistance: Supervision ?Gait Distance (Feet): 320 Feet ?Assistive device: Straight cane (left) ?Gait Pattern/deviations: Step-through pattern, Decreased step length - right, Decreased step length - left, Decreased stride length, Narrow base of support ?Gait velocity: decreased ?  ?  ?General Gait Details: mild unsteadiness, however no LOB noted ? ? ?Stairs ?  ?  ?  ?  ?  ? ? ?Wheelchair Mobility ?  ? ?Modified Rankin (Stroke Patients Only) ?  ? ? ?  ?Balance Overall balance assessment: Needs assistance ?Sitting-balance support: No upper extremity supported, Feet supported ?Sitting balance-Leahy Scale: Good ?  ?  ?Standing balance support: Single extremity supported, During functional activity, Reliant on assistive device for balance ?Standing balance-Leahy Scale: Good ?  ?  ?  ?  ?  ?  ?  ?  ?  ?  ?  ?  ?  ? ?  ?Cognition Arousal/Alertness: Awake/alert ?Behavior During Therapy: [facility] for tasks assessed/performed ?Overall Cognitive Status: Within Functional Limits for tasks assessed ?  ?  ?  ?  ?  ?  ?  ?  ?  ?  ?  ?  ?  ?  ?  ?  ?  ?  ?  ? ?  ?Exercises Other  Exercises ?Other Exercises: x10 sit to stands from EOB with L SPC ?Other Exercises: Standing marches x10 B with L SPC, 1 posterior LOB back onto bed ("due to standing up to fast") ?Other Exercises: x10 Seated LAQ from EOB x10 B ? ?  ?General Comments General comments (skin integrity, edema, etc.): SpO2 reamined >90% throughout sesion on room air, HR ranged from 80-96bpm ?  ?  ? ?Pertinent Vitals/Pain Pain Assessment ?Pain Assessment:  No/denies pain ?Pain Intervention(s): Monitored during session  ? ? ?Home Living   ?  ?  ?  ?  ?  ?  ?  ?  ?  ?   ?  ?Prior Function    ?  ?  ?   ? ?PT Goals (current goals can now be found in the care plan section) Acute Rehab PT Goals ?Patient Stated Goal: to go home ?PT Goal Formulation: With patient ?Time For Goal Achievement: 03/23/22 ?Potential to Achieve Goals: Good ?Progress towards PT goals: Progressing toward goals ? ?  ?Frequency ? ? ? Min 2X/week ? ? ? ?  ?PT Plan Current plan remains appropriate  ? ? ?Co-evaluation   ?  ?  ?  ?  ? ?  ?AM-PAC PT "6 Clicks" Mobility   ?Outcome Measure ? Help needed turning from your back to your side while in a flat bed without using bedrails?: A Little ?Help needed moving from lying on your back to sitting on the side of a flat bed without using bedrails?: A Little ?Help needed moving to and from a bed to a chair (including a wheelchair)?: A Little ?Help needed standing up from a chair using your arms (e.g., wheelchair or bedside chair)?: A Little ?Help needed to walk in hospital room?: A Little ?Help needed climbing 3-5 steps with a railing? : A Little ?6 Click Score: 18 ? ?  ?End of Session Equipment Utilized During Treatment: Gait belt;Oxygen ?Activity Tolerance: Patient tolerated treatment well;No increased pain ?Patient left: in bed;with call bell/phone within reach;with chair alarm set ?Nurse Communication: Mobility status ?PT Visit Diagnosis: Unsteadiness on feet (R26.81);Muscle weakness (generalized) (M62.81) ?  ? ? ?Time: [id number] ?PT Time Calculation (min) (ACUTE ONLY): 16 min ? ?Charges:  $Gait Training: 8-22 mins          ?          ? ?Iva Boop, PT  ?03/12/22. 12:53 PM ? ? ?

## 2022-03-13 DIAGNOSIS — J441 Chronic obstructive pulmonary disease with (acute) exacerbation: Secondary | ICD-10-CM | POA: Diagnosis not present

## 2022-03-13 LAB — CBC
HCT: 41.5 % (ref 36.0–46.0)
Hemoglobin: 13.6 g/dL (ref 12.0–15.0)
MCH: 32 pg (ref 26.0–34.0)
MCHC: 32.8 g/dL (ref 30.0–36.0)
MCV: 97.6 fL (ref 80.0–100.0)
Platelets: 316 10*3/uL (ref 150–400)
RBC: 4.25 MIL/uL (ref 3.87–5.11)
RDW: 12 % (ref 11.5–15.5)
WBC: 8.2 10*3/uL (ref 4.0–10.5)
nRBC: 0 % (ref 0.0–0.2)

## 2022-03-13 LAB — BASIC METABOLIC PANEL
Anion gap: 10 (ref 5–15)
BUN: 16 mg/dL (ref 8–23)
CO2: 36 mmol/L — ABNORMAL HIGH (ref 22–32)
Calcium: 9.2 mg/dL (ref 8.9–10.3)
Chloride: 90 mmol/L — ABNORMAL LOW (ref 98–111)
Creatinine, Ser: 0.68 mg/dL (ref 0.44–1.00)
GFR, Estimated: >60 mL/min (ref >60)
Glucose, Bld: 93 mg/dL (ref 70–99)
Potassium: 3.1 mmol/L — ABNORMAL LOW (ref 3.5–5.1)
Sodium: 136 mmol/L (ref 135–145)

## 2022-03-13 LAB — CULTURE, BLOOD (ROUTINE X 2): Culture: NO GROWTH

## 2022-03-13 LAB — MAGNESIUM: Magnesium: 1.9 mg/dL (ref 1.7–2.4)

## 2022-03-13 MED ORDER — METOPROLOL SUCCINATE ER 100 MG PO TB24
100.0000 mg | ORAL_TABLET | Freq: Every day | ORAL | Status: DC
  Administered 2022-03-13 – 2022-03-16 (×4): 100 mg via ORAL
  Filled 2022-03-13 (×4): qty 1

## 2022-03-13 MED ORDER — FUROSEMIDE 10 MG/ML IJ SOLN
40.0000 mg | Freq: Once | INTRAMUSCULAR | Status: AC
  Administered 2022-03-13: 40 mg via INTRAVENOUS
  Filled 2022-03-13: qty 4

## 2022-03-13 MED ORDER — ACETYLCYSTEINE 20 % IN SOLN
4.0000 mL | Freq: Two times a day (BID) | RESPIRATORY_TRACT | Status: DC
  Administered 2022-03-13 – 2022-03-16 (×5): 4 mL via RESPIRATORY_TRACT
  Filled 2022-03-13 (×6): qty 4

## 2022-03-13 MED ORDER — DILTIAZEM HCL 30 MG PO TABS: 30.0000 mg | ORAL_TABLET | Freq: Three times a day (TID) | ORAL | Status: DC

## 2022-03-13 MED ORDER — ACETAMINOPHEN 500 MG PO TABS
1000.0000 mg | ORAL_TABLET | Freq: Three times a day (TID) | ORAL | Status: DC | PRN
  Administered 2022-03-13 – 2022-03-15 (×2): 1000 mg via ORAL
  Filled 2022-03-13 (×2): qty 2

## 2022-03-13 MED ORDER — ACETYLCYSTEINE 20 % IN SOLN
4.0000 mL | Freq: Once | RESPIRATORY_TRACT | Status: DC
Start: 2022-03-13 — End: 2022-03-16
  Filled 2022-03-13: qty 4

## 2022-03-13 MED ORDER — DILTIAZEM HCL 30 MG PO TABS: 60.0000 mg | ORAL_TABLET | Freq: Four times a day (QID) | ORAL | Status: DC

## 2022-03-13 MED ORDER — POTASSIUM CHLORIDE CRYS ER 20 MEQ PO TBCR
40.0000 meq | EXTENDED_RELEASE_TABLET | ORAL | Status: AC
  Administered 2022-03-13 (×2): 40 meq via ORAL
  Filled 2022-03-13 (×2): qty 2

## 2022-03-13 NOTE — Progress Notes (Signed)
?  Progress Note ? ? ?Patient: Stacey Sandoval [id number] DOB: June 05, 1951 DOA: 03/07/2022     5 ?DOS: the patient was seen and examined on 03/13/2022 ?  ?Brief hospital course: ?[name] is a 71 year old female insomnia, hypertension, hyperlipidemia, who presents emergency department for chief concerns of shortness of breath and cough. ? ?ED vitals show temperature of 98.6, respiration rate of 20, heart rate 102, blood pressure 136/67, SPO2 of 95% on 2 L nasal cannula. ? ?Serum sodium 127, potassium 4.2, chloride 89, bicarb 25, BUN of 13, serum creatinine of 0.62, GFR greater than 60, nonfasting blood glucose 100, WBC 7.1, hemoglobin 12.3, platelets of 287. ? ?BNP was 82.2. ? ?High sensitive troponin was 16 and was unchanged at 16 on repeat. ? ?COVID/influenza A/influenza B PCR were negative. ? ?TSH was 0.208, T4: 0.87. ? ?ED treatment: DuoNeb x1, Solu-Medrol 125 mg, azithromycin 500 mg IV, ceftriaxone 2 g IV. ? ?Assessment and Plan: ?* COPD exacerbation (HCC) ?-no formal dx of COPD, however, given extensive smoking hx and current smoking status, likely has COPD exacerbation. ?--pos sick contacts from grandchildren. ?--RVP pos for rhinovirus ?--finished prednisone burst and azithromycin ?Plan: ?--cont nebs as xopenex and Atrovent to avoid tachycardia ?--add mucomyst neb BID ? ?Atrial fibrillation with rapid ventricular response (HCC) ?--went into Afib w RVR, HR up to 140's morning of 3/31.  s/p IV dilt 5 mg x2 f/b oral dilt ?Plan: ?--increase home Toprol to 100 mg daily ?--hold oral dilt for now due to intermittent low BP.  Can resume if HR persistently elevated >110 ? ?Obesity (BMI 30-39.9) ?BMI 31.25 ? ?Current every day smoker ?--pt expressed readiness to quit smoking ? ?Rhinovirus infection ?--had pos sick contact from grandchildren.  Likely the trigger of her COPD exacerbation and dyspnea ? ?Acute hypoxemic respiratory failure (HCC) ?--due to fluid overload from IVF causing pulm edema.  Also COPD  exacerbation. ?--required up to 8L HFNC, weaning down with IV diuresis ?--currently still needs 2L at rest ?Plan: ?--Continue supplemental O2 to keep sats >=92%, wean as tolerated ? ? ? ?Acute pulmonary edema (HCC) ?--developed after receiving IVF.  Diuresed well with IV lasix 40 BID ?Plan: ?--IV lasix 40 x1 today ? ?Hyponatremia ?-Appears euvolemic on exam ?--trend Na ? ?Hyperlipidemia ?- cont atorvastatin 20 mg daily ? ?Essential hypertension ?--increase metoprolol succinate to 100 mg daily ?-Home valsartan-hydrochlorothiazide 320-25 mg held due to patient currently normotensive at this time ?--IV lasix 40 x1 today ? ?Insomnia ?- Patient reports that her primary care provider has tried a number of medications including trazodone, melatonin, Ambien ?- Pt reported taking Benadryl nightly and drinks 2 glasses of wine with ice  ? ? ? ? ?  ? ?Subjective:  ?HR still intermittently elevated.  Still desat in room air. ? ? ?Physical Exam: ? ?Constitutional: NAD, AAOx3 ?HEENT: conjunctivae and lids normal, EOMI ?CV: No cyanosis.   ?RESP: normal respiratory effort, on 2L ?SKIN: warm, dry ?Neuro: II - XII grossly intact.   ?Psych: subdued mood and affect.  Appropriate judgement and reason ? ? ?Data Reviewed: ? ?Family Communication:  ? ?Disposition: ?Status is: Inpatient ? ? Planned Discharge Destination: Home ? ? ? ?Time spent: 50 minutes ? ?Author: ?Darlin Priestly, MD ?03/13/2022 3:00 PM ? ?For on call review [email or web address]  ?

## 2022-03-13 NOTE — Progress Notes (Signed)
?   03/11/22 1003  ?Assess: MEWS Score  ?Temp 98.5 ?F (36.9 ?C)  ?BP (!) 97/58  ?Pulse Rate (!) 146  ?Resp 20  ?SpO2 92 %  ?O2 Device Nasal Cannula  ?O2 Flow Rate (L/min) 2 L/min  ?Assess: MEWS Score  ?MEWS Temp 0  ?MEWS Systolic 1  ?MEWS Pulse 3  ?MEWS RR 0  ?MEWS LOC 0  ?MEWS Score 4  ?MEWS Score Color Red  ?Assess: if the MEWS score is Yellow or Red  ?Were vital signs taken at a resting state? Yes  ?Focused Assessment Change from prior assessment (see assessment flowsheet)  ?Does the patient meet 2 or more of the SIRS criteria? No  ?MEWS guidelines implemented *See Row Information* Yes  ?Treat  ?MEWS Interventions Escalated (See documentation below)  ?Pain Scale 0-10  ?Pain Score 0  ?Take Vital Signs  ?Increase Vital Sign Frequency  Red: Q 1hr X 4 then Q 4hr X 4, if remains red, continue Q 4hrs  ?Escalate  ?MEWS: Escalate Red: discuss with charge nurse/RN and provider, consider discussing with RRT  ?Notify: Charge Nurse/RN  ?Name of Charge Nurse/RN Notified [email or web address]  ?Date Charge Nurse/RN Notified 03/11/22  ?Time Charge Nurse/RN Notified 1010  ?Notify: Provider  ?Provider Name/Title dr Billie Ruddy  ?Date Provider Notified 03/11/22  ?Time Provider Notified 1005  ?Notification Type Page  ?Notification Reason Change in status  ?Provider response En route  ?Date of Provider Response 03/11/22  ?Time of Provider Response 1015  ?Document  ?Patient Outcome Stabilized after interventions  ?Assess: SIRS CRITERIA  ?SIRS Temperature  0  ?SIRS Pulse 1  ?SIRS Respirations  0  ?SIRS WBC 0  ?SIRS Score Sum  1  ? ? ?

## 2022-03-14 ENCOUNTER — Other Ambulatory Visit (HOSPITAL_COMMUNITY): Payer: Self-pay

## 2022-03-14 LAB — BASIC METABOLIC PANEL
Anion gap: 8 (ref 5–15)
BUN: 16 mg/dL (ref 8–23)
CO2: 35 mmol/L — ABNORMAL HIGH (ref 22–32)
Calcium: 9.1 mg/dL (ref 8.9–10.3)
Chloride: 93 mmol/L — ABNORMAL LOW (ref 98–111)
Creatinine, Ser: 0.7 mg/dL (ref 0.44–1.00)
GFR, Estimated: >60 mL/min (ref >60)
Glucose, Bld: 104 mg/dL — ABNORMAL HIGH (ref 70–99)
Potassium: 3.6 mmol/L (ref 3.5–5.1)
Sodium: 136 mmol/L (ref 135–145)

## 2022-03-14 LAB — MAGNESIUM: Magnesium: 1.9 mg/dL (ref 1.7–2.4)

## 2022-03-14 LAB — CBC
HCT: 41.4 % (ref 36.0–46.0)
Hemoglobin: 13.3 g/dL (ref 12.0–15.0)
MCH: 31.7 pg (ref 26.0–34.0)
MCHC: 32.1 g/dL (ref 30.0–36.0)
MCV: 98.6 fL (ref 80.0–100.0)
Platelets: 317 10*3/uL (ref 150–400)
RBC: 4.2 MIL/uL (ref 3.87–5.11)
RDW: 12.1 % (ref 11.5–15.5)
WBC: 8.5 10*3/uL (ref 4.0–10.5)
nRBC: 0 % (ref 0.0–0.2)

## 2022-03-14 MED ORDER — FUROSEMIDE 10 MG/ML IJ SOLN
40.0000 mg | Freq: Every day | INTRAMUSCULAR | Status: DC
  Administered 2022-03-14 – 2022-03-16 (×3): 40 mg via INTRAVENOUS
  Filled 2022-03-14 (×3): qty 4

## 2022-03-14 MED ORDER — APIXABAN 5 MG PO TABS
5.0000 mg | ORAL_TABLET | Freq: Two times a day (BID) | ORAL | Status: DC
  Administered 2022-03-14 – 2022-03-16 (×5): 5 mg via ORAL
  Filled 2022-03-14 (×5): qty 1

## 2022-03-14 MED ORDER — AMIODARONE HCL IN DEXTROSE 360-4.14 MG/200ML-% IV SOLN
60.0000 mg/h | INTRAVENOUS | Status: AC
  Administered 2022-03-14 (×2): 60 mg/h via INTRAVENOUS
  Filled 2022-03-14: qty 200

## 2022-03-14 MED ORDER — AMIODARONE LOAD VIA INFUSION
150.0000 mg | Freq: Once | INTRAVENOUS | Status: AC
  Administered 2022-03-14: 150 mg via INTRAVENOUS
  Filled 2022-03-14: qty 83.34

## 2022-03-14 MED ORDER — FUROSEMIDE 10 MG/ML IJ SOLN
40.0000 mg | Freq: Once | INTRAMUSCULAR | Status: AC
  Administered 2022-03-14: 40 mg via INTRAVENOUS
  Filled 2022-03-14: qty 4

## 2022-03-14 MED ORDER — AMIODARONE HCL IN DEXTROSE 360-4.14 MG/200ML-% IV SOLN
30.0000 mg/h | INTRAVENOUS | Status: DC
Start: 2022-03-14 — End: 2022-03-15
  Administered 2022-03-14 – 2022-03-15 (×3): 30 mg/h via INTRAVENOUS
  Filled 2022-03-14 (×3): qty 200

## 2022-03-14 NOTE — Assessment & Plan Note (Addendum)
Improved

## 2022-03-14 NOTE — TOC Benefit Eligibility Note (Signed)
Patient Advocate Encounter  Insurance verification completed.    The patient is currently admitted and upon discharge could be taking Eliquis 5 mg.  The current 30 day co-pay is, $47.00.   The patient is insured through [employer] Medicare Part D    Tayley Mudrick, CPhT Pharmacy Patient Advocate Specialist [facility] Patient Advocate Team Direct Number: [phone]  Fax: [phone]        

## 2022-03-14 NOTE — Progress Notes (Signed)
?  Progress Note ? ? ?Patient: BRIANNAH LONA ZOX:[id number] DOB: 27-Oct-1951 DOA: 03/07/2022     6 ?DOS: the patient was seen and examined on 03/14/2022 ?  ?Brief hospital course: ?Ms. Tameisha Covell is a 71 year old female insomnia, hypertension, hyperlipidemia, who presents emergency department for chief concerns of shortness of breath and cough. ? ?ED vitals show temperature of 98.6, respiration rate of 20, heart rate 102, blood pressure 136/67, SPO2 of 95% on 2 L nasal cannula. ? ?Serum sodium 127, potassium 4.2, chloride 89, bicarb 25, BUN of 13, serum creatinine of 0.62, GFR greater than 60, nonfasting blood glucose 100, WBC 7.1, hemoglobin 12.3, platelets of 287. ? ?BNP was 82.2. ? ?High sensitive troponin was 16 and was unchanged at 16 on repeat. ? ?COVID/influenza A/influenza B PCR were negative. ? ?TSH was 0.208, T4: 0.87. ? ?ED treatment: DuoNeb x1, Solu-Medrol 125 mg, azithromycin 500 mg IV, ceftriaxone 2 g IV. ? ?Assessment and Plan: ?* COPD exacerbation (HCC) ?-no formal dx of COPD, however, given extensive smoking hx and current smoking status, likely has COPD exacerbation. ?--pos sick contacts from grandchildren. ?--RVP pos for rhinovirus ?--finished prednisone burst and azithromycin ?Plan: ?--cont nebs as xopenex and Atrovent to avoid tachycardia ?--cont mucomyst neb BID ? ?Hypomagnesemia ?--monitor and replete with IV mag ? ?Atrial fibrillation with rapid ventricular response (HCC) ?--went into Afib w RVR, HR up to 140's morning of 3/31.  s/p IV dilt 5 mg x2 f/b oral dilt, and increased Toprol.  HR remained uncontrolled.  Intermittent hypotension limited increase in rate control agent. ?Plan: ?--cont home Toprol 100 mg daily ?--start IV amiodarone ?--start Eliquis ? ?Obesity (BMI 30-39.9) ?BMI 31.25 ? ?Current every day smoker ?--pt expressed readiness to quit smoking ? ?Rhinovirus infection ?--had pos sick contact from grandchildren.  Likely the trigger of her COPD exacerbation and dyspnea ? ?Acute  hypoxemic respiratory failure (HCC) ?--due to fluid overload from IVF causing pulm edema.  Also COPD exacerbation. ?--required up to 8L HFNC, weaning down with IV diuresis ?--currently still needs 2L at rest ?Plan: ?--Continue supplemental O2 to keep sats >=92%, wean as tolerated ? ? ? ?Acute pulmonary edema (HCC) ?--developed after receiving IVF.  Diuresed well with IV lasix 40  ?Plan: ?--cont IV lasix 40 mg daily ? ?Hyponatremia ?-Appears euvolemic on exam ?--trend Na ? ?Hyperlipidemia ?- cont atorvastatin 20 mg daily ? ?Essential hypertension ?--cont metoprolol succinate 100 mg daily ?-Home valsartan-hydrochlorothiazide 320-25 mg held due to intermittent low BP ?--IV lasix 40 daily ? ?Insomnia ?- Patient reports that her primary care provider has tried a number of medications including trazodone, melatonin, Ambien ?- Pt reported taking Benadryl nightly and drinks 2 glasses of wine with ice  ? ? ? ? ?  ? ?Subjective:  ?Pt reported mucomyst neb helped with loosening up her sputum and she was starting to bring up sputum. ? ?HR remained uncontrolled with intermittent soft BP.  Start IV amiodarone. ? ? ?Physical Exam: ? ?Constitutional: NAD, AAOx3 ?HEENT: conjunctivae and lids normal, EOMI ?CV: No cyanosis.   ?RESP: normal respiratory effort, on 2L ?SKIN: warm, dry ?Neuro: II - XII grossly intact.   ?Psych: subdued mood and affect.  Appropriate judgement and reason ? ? ? ?Data Reviewed: ? ?Family Communication:  ? ?Disposition: ?Status is: Inpatient ? ? Planned Discharge Destination: Home ? ? ? ?Time spent: 50 minutes ? ?Author: ?Darlin Priestly, MD ?03/14/2022 6:56 PM ? ?For on call review [email or web address].  ?

## 2022-03-14 NOTE — Consult Note (Signed)
ANTICOAGULATION CONSULT NOTE - Initial Consult ? ?Pharmacy Consult for Eliquis ?Indication: atrial fibrillation ? ?No Known Allergies ? ?Patient Measurements: ?Height: 5' (152.4 cm) ?Weight: 72.6 kg (160 lb) ?IBW/kg (Calculated) : 45.5 ? ?Vital Signs: ?Temp: 97.9 ?F (36.6 ?C) (04/03 0809) ?BP: 128/56 (04/03 0809) ?Pulse Rate: 106 (04/03 0809) ? ?Labs: ?Recent Labs  ?  03/12/22 ?0412 03/13/22 ?0430 03/14/22 ?0430  ?HGB 12.9 13.6 13.3  ?HCT 40.5 41.5 41.4  ?PLT 347 316 317  ?CREATININE 0.79 0.68 0.70  ? ? ?Estimated Creatinine Clearance: 56.5 mL/min (by C-G formula based on SCr of 0.7 mg/dL). ? ?Medical History: ?Past Medical History:  ?Diagnosis Date  ? COPD (chronic obstructive pulmonary disease) (HCC)   ? Hyperlipidemia   ? Hypertension   ? Tobacco use   ? ? ?Medications:  ?Scheduled:  ? acetylcysteine  4 mL Nebulization BID  ? acetylcysteine  4 mL Nebulization Once  ? amiodarone  150 mg Intravenous Once  ? apixaban  5 mg Oral BID  ? atorvastatin  20 mg Oral Daily  ? enoxaparin (LOVENOX) injection  40 mg Subcutaneous Q24H  ? famotidine  40 mg Oral BID  ? furosemide  40 mg Intravenous Once  ? gabapentin  600 mg Oral BID  ? ipratropium  0.5 mg Nebulization TID  ? levalbuterol  0.63 mg Nebulization TID  ? metoprolol succinate  100 mg Oral Daily  ? ? ?Assessment: ?Patient with hx of HTN. HLD, and COPD. Diagnosed with new onset A.Fib. Pharmacy consulted to dose Apixaban for stroke prevention. ? ?Plan:  ?Start Eliquis 5mg  twice daily ?Follow up CBC while inpatient ? ?Josetta Wigal Rodriguez-Guzman PharmD, BCPS ?03/14/2022 8:11 AM ? ? ? ?

## 2022-03-14 NOTE — Progress Notes (Signed)
Physical Therapy Treatment ?Patient Details ?Name: Stacey Sandoval ?MRN: [id number] ?DOB: 10-10-1951 ?Today's Date: 03/14/2022 ? ? ?History of Present Illness Patient is a 71 year old female who reported to [facility] on 03/07/22 due to cough and SOB lasting ~1 week. PMH (+) for  long history of smoking, insomnia, hypertension, and hyperlipidemia ? ?  ?PT Comments  ? ? Excited to get up and walk.  X 3 laps with SPC and does hold IV pole with other hand.  Stated she uses SPC and touch assist with objects at home at baseline.  Does well today with gait with good endurance.  She does have RW at home if needed and encouraged to use if out in community until strength returns.  She opt to sit on [facility] while eating lunch after session.  Daughter in room and given mobility and their comfort is OK to transfer back to bed with daughter assist.   ?  ?Recommendations for follow up therapy are one component of a multi-disciplinary discharge planning process, led by the attending physician.  Recommendations may be updated based on patient status, additional functional criteria and insurance authorization. ? ?Follow Up Recommendations ? Home health PT ?  ?  ?Assistance Recommended at Discharge PRN  ?Patient can return home with the following Help with stairs or ramp for entrance ?  ?Equipment Recommendations ? None recommended by PT  ?  ?Recommendations for Other Services   ? ? ?  ?Precautions / Restrictions Precautions ?Precautions: Fall ?Restrictions ?Weight Bearing Restrictions: No  ?  ? ?Mobility ? Bed Mobility ?Overal bed mobility: Modified Independent ?  ?  ?  ?  ?  ?  ?  ?  ? ?Transfers ?Overall transfer level: Modified independent ?  ?  ?  ?  ?  ?  ?  ?  ?  ?  ? ?Ambulation/Gait ?Ambulation/Gait assistance: Supervision ?  ?Assistive device: Straight cane, IV Pole ?Gait Pattern/deviations: Step-through pattern ?Gait velocity: decreased ?  ?  ?General Gait Details: generally unsteady with 2 UE support.  has RW at home if needed.  uses  SPC and objects in home for hand touch assist as needed. ? ? ?Stairs ?  ?  ?  ?  ?  ? ? ?Wheelchair Mobility ?  ? ?Modified Rankin (Stroke Patients Only) ?  ? ? ?  ?Balance Overall balance assessment: Modified Independent ?Sitting-balance support: Bilateral upper extremity supported ?Sitting balance-Leahy Scale: Normal ?  ?  ?Standing balance support: Single extremity supported, During functional activity, Reliant on assistive device for balance ?Standing balance-Leahy Scale: Good ?  ?  ?  ?  ?  ?  ?  ?  ?  ?  ?  ?  ?  ? ?  ?Cognition Arousal/Alertness: Awake/alert ?Behavior During Therapy: [facility] for tasks assessed/performed ?Overall Cognitive Status: Within Functional Limits for tasks assessed ?  ?  ?  ?  ?  ?  ?  ?  ?  ?  ?  ?  ?  ?  ?  ?  ?  ?  ?  ? ?  ?Exercises   ? ?  ?General Comments   ?  ?  ? ?Pertinent Vitals/Pain Pain Assessment ?Pain Assessment: No/denies pain  ? ? ?Home Living   ?  ?  ?  ?  ?  ?  ?  ?  ?  ?   ?  ?Prior Function    ?  ?  ?   ? ?PT Goals (current goals  can now be found in the care plan section) Progress towards PT goals: Progressing toward goals ? ?  ?Frequency ? ? ? Min 2X/week ? ? ? ?  ?PT Plan Current plan remains appropriate  ? ? ?Co-evaluation   ?  ?  ?  ?  ? ?  ?AM-PAC PT "6 Clicks" Mobility   ?Outcome Measure ? Help needed turning from your back to your side while in a flat bed without using bedrails?: None ?Help needed moving from lying on your back to sitting on the side of a flat bed without using bedrails?: None ?Help needed moving to and from a bed to a chair (including a wheelchair)?: None ?Help needed standing up from a chair using your arms (e.g., wheelchair or bedside chair)?: None ?Help needed to walk in hospital room?: A Little ?Help needed climbing 3-5 steps with a railing? : A Little ?6 Click Score: 22 ? ?  ?End of Session Equipment Utilized During Treatment: Gait belt;Oxygen ?Activity Tolerance: Patient tolerated treatment well;No increased pain ?Patient left: in  chair;with family/visitor present ?Nurse Communication: Mobility status ?PT Visit Diagnosis: Unsteadiness on feet (R26.81);Muscle weakness (generalized) (M62.81) ?  ? ? ?Time: 0120-0144 ?PT Time Calculation (min) (ACUTE ONLY): 24 min ? ?Charges:  $Gait Training: 23-37 mins          ?          ?Chesley Noon, PTA ?03/14/22, 1:53 PM ? ?

## 2022-03-14 NOTE — Progress Notes (Signed)
SATURATION QUALIFICATIONS: (This note is used to comply with regulatory documentation for home oxygen) ? ?Patient Saturations on Room Air at Rest = 86% ? ?Patient Saturations on 2 L while at Rest = 95% ? ?Patient Saturations on 2 Liters of oxygen while Ambulating = 94% ? ?Please briefly explain why patient needs home oxygen: ?

## 2022-03-15 LAB — CBC
HCT: 39.6 % (ref 36.0–46.0)
Hemoglobin: 13 g/dL (ref 12.0–15.0)
MCH: 31.9 pg (ref 26.0–34.0)
MCHC: 32.8 g/dL (ref 30.0–36.0)
MCV: 97.3 fL (ref 80.0–100.0)
Platelets: 276 10*3/uL (ref 150–400)
RBC: 4.07 MIL/uL (ref 3.87–5.11)
RDW: 12.1 % (ref 11.5–15.5)
WBC: 8.5 10*3/uL (ref 4.0–10.5)
nRBC: 0 % (ref 0.0–0.2)

## 2022-03-15 LAB — MAGNESIUM: Magnesium: 1.7 mg/dL (ref 1.7–2.4)

## 2022-03-15 LAB — BASIC METABOLIC PANEL
Anion gap: 9 (ref 5–15)
BUN: 14 mg/dL (ref 8–23)
CO2: 35 mmol/L — ABNORMAL HIGH (ref 22–32)
Calcium: 8.9 mg/dL (ref 8.9–10.3)
Chloride: 90 mmol/L — ABNORMAL LOW (ref 98–111)
Creatinine, Ser: 0.66 mg/dL (ref 0.44–1.00)
GFR, Estimated: >60 mL/min (ref >60)
Glucose, Bld: 91 mg/dL (ref 70–99)
Potassium: 3.2 mmol/L — ABNORMAL LOW (ref 3.5–5.1)
Sodium: 134 mmol/L — ABNORMAL LOW (ref 135–145)

## 2022-03-15 MED ORDER — POTASSIUM CHLORIDE CRYS ER 20 MEQ PO TBCR
40.0000 meq | EXTENDED_RELEASE_TABLET | Freq: Once | ORAL | Status: AC
Start: 2022-03-15 — End: 2022-03-15
  Administered 2022-03-15: 40 meq via ORAL
  Filled 2022-03-15: qty 2

## 2022-03-15 MED ORDER — POLYVINYL ALCOHOL 1.4 % OP SOLN
1.0000 [drp] | OPHTHALMIC | Status: DC | PRN
  Administered 2022-03-15: 1 [drp] via OPHTHALMIC
  Filled 2022-03-15: qty 15

## 2022-03-15 MED ORDER — GABAPENTIN 300 MG PO CAPS
300.0000 mg | ORAL_CAPSULE | Freq: Every day | ORAL | Status: DC
  Administered 2022-03-16: 300 mg via ORAL
  Filled 2022-03-15: qty 1

## 2022-03-15 MED ORDER — GABAPENTIN 300 MG PO CAPS: 600.0000 mg | ORAL_CAPSULE | Freq: Every day | ORAL | Status: DC

## 2022-03-15 NOTE — Progress Notes (Signed)
Physical Therapy Treatment ?Patient Details ?Name: Stacey Sandoval ?MRN: [id number] ?DOB: 06/14/1951 ?Today's Date: 03/15/2022 ? ? ?History of Present Illness Patient is a 71 year old female who reported to [facility] on 03/07/22 due to cough and SOB lasting ~1 week. PMH (+) for  long history of smoking, insomnia, hypertension, and hyperlipidemia ? ?  ?PT Comments  ? ? Physical Therapy session completed today. Patient tolerated session well and was agreeable to treatment. Patient only reported mild pain in her back, however did not increase during session. Patient demonstrated good balance and endurance ambulating 3 laps around the nurses station with L SPC and 2 L via [place] and picking up multiple objects off the floor with no LOB concerns. Patient is progressing nicely towards her goals and would continue to benefit from skilled physical therapy in order to optimize patient's return to PLOF. Continue to recommend HHPT upon discharge from acute hospitalization.  ? ?  ?Recommendations for follow up therapy are one component of a multi-disciplinary discharge planning process, led by the attending physician.  Recommendations may be updated based on patient status, additional functional criteria and insurance authorization. ? ?Follow Up Recommendations ? Home health PT ?  ?  ?Assistance Recommended at Discharge PRN  ?Patient can return home with the following Help with stairs or ramp for entrance ?  ?Equipment Recommendations ? None recommended by PT  ?  ?Recommendations for Other Services   ? ? ?  ?Precautions / Restrictions Precautions ?Precautions: Fall ?Restrictions ?Weight Bearing Restrictions: No  ?  ? ?Mobility ? Bed Mobility ?Overal bed mobility: Modified Independent ?Bed Mobility: Supine to Sit, Sit to Supine ?  ?  ?Supine to sit: Modified independent (Device/Increase time) ?Sit to supine: Modified independent (Device/Increase time) ?  ?  ?  ? ?Transfers ?Overall transfer level: Modified independent ?Equipment used:  Straight cane (L) ?Transfers: Sit to/from Stand ?Sit to Stand: Modified independent (Device/Increase time) ?  ?  ?  ?  ?  ?  ?  ? ?Ambulation/Gait ?Ambulation/Gait assistance: Modified independent (Device/Increase time) ?Gait Distance (Feet): 500 Feet ?Assistive device: Straight cane ?Gait Pattern/deviations: Step-through pattern ?Gait velocity: decreased ?  ?  ?General Gait Details: SpO2 ranged from 94% prior to ambulation, 86% during ambulation, back to 97% at conclusion of session, no unsteadiness noted ? ? ?Stairs ?  ?  ?  ?  ?  ? ? ?Wheelchair Mobility ?  ? ?Modified Rankin (Stroke Patients Only) ?  ? ? ?  ?Balance Overall balance assessment: Modified Independent ?Sitting-balance support: Bilateral upper extremity supported ?Sitting balance-Leahy Scale: Normal ?  ?  ?Standing balance support: Single extremity supported, During functional activity, Reliant on assistive device for balance ?Standing balance-Leahy Scale: Good ?Standing balance comment: patient was able to pick objects up off the floor with L SPC and no LOB noted ?  ?  ?  ?  ?  ?  ?  ?  ?  ?  ?  ?  ? ?  ?Cognition Arousal/Alertness: Awake/alert ?Behavior During Therapy: [facility] for tasks assessed/performed ?Overall Cognitive Status: Within Functional Limits for tasks assessed ?  ?  ?  ?  ?  ?  ?  ?  ?  ?  ?  ?  ?  ?  ?  ?  ?General Comments: Pleasant to work with ?  ?  ? ?  ?Exercises Other Exercises ?Other Exercises: x10 sit to stands from EOB with L SPC ?Other Exercises: Picked up 10 objects around the room with L  SPC with no LOB concerns ? ?  ?General Comments   ?  ?  ? ?Pertinent Vitals/Pain Pain Assessment ?Pain Assessment: 0-10 ?Pain Score: 2  ?Pain Location: back pain ?Pain Descriptors / Indicators: Aching, Discomfort, Dull ?Pain Intervention(s): Monitored during session  ? ? ?Home Living   ?  ?  ?  ?  ?  ?  ?  ?  ?  ?   ?  ?Prior Function    ?  ?  ?   ? ?PT Goals (current goals can now be found in the care plan section) Acute Rehab PT  Goals ?Patient Stated Goal: to go home ?PT Goal Formulation: With patient ?Time For Goal Achievement: 03/23/22 ?Potential to Achieve Goals: Good ?Progress towards PT goals: Progressing toward goals ? ?  ?Frequency ? ? ? Min 2X/week ? ? ? ?  ?PT Plan Current plan remains appropriate  ? ? ?Co-evaluation   ?  ?  ?  ?  ? ?  ?AM-PAC PT "6 Clicks" Mobility   ?Outcome Measure ? Help needed turning from your back to your side while in a flat bed without using bedrails?: None ?Help needed moving from lying on your back to sitting on the side of a flat bed without using bedrails?: None ?Help needed moving to and from a bed to a chair (including a wheelchair)?: None ?Help needed standing up from a chair using your arms (e.g., wheelchair or bedside chair)?: None ?Help needed to walk in hospital room?: A Little ?Help needed climbing 3-5 steps with a railing? : A Little ?6 Click Score: 22 ? ?  ?End of Session Equipment Utilized During Treatment: Gait belt;Oxygen ?Activity Tolerance: Patient tolerated treatment well;No increased pain ?Patient left: in bed;with call bell/phone within reach ?Nurse Communication: Mobility status ?PT Visit Diagnosis: Unsteadiness on feet (R26.81);Muscle weakness (generalized) (M62.81) ?  ? ? ?Time: [id number] ?PT Time Calculation (min) (ACUTE ONLY): 18 min ? ?Charges:  $Gait Training: 8-22 mins          ?          ? ?Iva Boop, PT  ?03/15/22. 2:33 PM ? ? ?

## 2022-03-15 NOTE — Progress Notes (Signed)
SATURATION QUALIFICATIONS: (This note is used to comply with regulatory documentation for home oxygen) ? ?Patient Saturations on Room Air at Rest = 88% ? ?Patient Saturations on Room Air while Ambulating = 82% ? ?Patient Saturations on 2 Liters of oxygen while Ambulating = 94% ? ?Please briefly explain why patient needs home oxygen: ? ?Patient unable to maintain safe and oxygen saturation levels while ambulating.  ?

## 2022-03-15 NOTE — TOC Progression Note (Signed)
Transition of Care (TOC) - Progression Note  ? ? ?Patient Details  ?Name: Stacey Sandoval ?MRN: [id number] ?Date of Birth: [birth year] ? ?Transition of Care (TOC) CM/SW Contact  ?Margarito Liner, LCSW ?Phone Number: ?03/15/2022, 4:25 PM ? ?Clinical Narrative:  Oxygen ordered through [employer] and has been delivered to the room. Per MD, plan for discharge tomorrow. Left message for [facility] representative to notify.  ? ?Expected Discharge Plan: Home w Home Health Services ?Barriers to Discharge: Continued Medical Work up ? ?Expected Discharge Plan and Services ?Expected Discharge Plan: Home w Home Health Services ?  ?Discharge Planning Services: CM Consult ?  ?Living arrangements for the past 2 months: Single Family Home ?Expected Discharge Date: 03/11/22               ?  ?  ?  ?  ?  ?  ?  ?  ?  ?  ? ? ?Social Determinants of Health (SDOH) Interventions ?  ? ?Readmission Risk Interventions ?   ? View : No data to display.  ?  ?  ?  ? ? ?

## 2022-03-15 NOTE — Progress Notes (Addendum)
?Progress Note ? ? ?Patient: Stacey Sandoval:[id number] DOB: September 30, 1951 DOA: 03/07/2022     7 ?DOS: the patient was seen and examined on 03/15/2022 ?  ?Brief hospital course: ?Ms. Akasia Ahmad is a 71 year old female insomnia, hypertension, hyperlipidemia, who presents emergency department for chief concerns of shortness of breath and cough. ? ?ED vitals show temperature of 98.6, respiration rate of 20, heart rate 102, blood pressure 136/67, SPO2 of 95% on 2 L nasal cannula. ? ?Serum sodium 127, potassium 4.2, chloride 89, bicarb 25, BUN of 13, serum creatinine of 0.62, GFR greater than 60, nonfasting blood glucose 100, WBC 7.1, hemoglobin 12.3, platelets of 287. ? ?BNP was 82.2. ? ?High sensitive troponin was 16 and was unchanged at 16 on repeat. ? ?COVID/influenza A/influenza B PCR were negative. ? ?TSH was 0.208, T4: 0.87. ? ?ED treatment: DuoNeb x1, Solu-Medrol 125 mg, azithromycin 500 mg IV, ceftriaxone 2 g IV. ? ?Assessment and Plan: ?* COPD exacerbation (HCC) ?-no formal dx of COPD, however, given extensive smoking hx and current smoking status, likely has COPD exacerbation. ?--pos sick contacts from grandchildren. ?--RVP pos for rhinovirus ?--finished prednisone burst and azithromycin ?Plan: ?--cont nebs as xopenex and Atrovent to avoid tachycardia ?--cont mucomyst neb BID ? ?Hypomagnesemia ?--monitor and replete with IV mag ? ?Atrial fibrillation with rapid ventricular response (HCC) ?--went into Afib w RVR, HR up to 140's morning of 3/31.  s/p IV dilt 5 mg x2 f/b oral dilt, and increased Toprol.  HR remained uncontrolled.  Intermittent hypotension limited increase in rate control agent.  Started on IV amio, and pt converted to NSR this morning and heart rate controlled. ?Plan: ?--cont Toprol 100 mg daily (increased from home 25 mg) ?--d/c IV amio ?--cont Eliquis (new) ?--refer to outpatient cardiology, Dr. Mariah Milling in 2 week after discharge. ? ?Obesity (BMI 30-39.9) ?BMI 31.25 ? ?Current every day  smoker ?--pt expressed readiness to quit smoking ? ?Rhinovirus infection ?--had pos sick contact from grandchildren.  Likely the trigger of her COPD exacerbation and dyspnea ? ?Acute hypoxemic respiratory failure (HCC) ?--due to fluid overload from IVF causing pulm edema.  Also COPD exacerbation. ?--required up to 8L HFNC, weaning down with IV diuresis ?--currently still needs 2L at rest ?Plan: ?--Continue supplemental O2 to keep sats >=92%, wean as tolerated  ?--will go home on 2L O2 ? ? ? ?Acute pulmonary edema (HCC) ?--developed after receiving IVF.  Diuresed well with IV lasix 40  ?Plan: ?--cont IV lasix 40 mg daily ? ?Hyponatremia ?-Appears euvolemic on exam ?--trend Na ? ?Hyperlipidemia ?- cont atorvastatin 20 mg daily ? ?Essential hypertension ?--cont metoprolol succinate 100 mg daily ?-Home valsartan-hydrochlorothiazide 320-25 mg held due to intermittent low BP ?--IV lasix 40 daily ? ?Insomnia ?- Patient reports that her primary care provider has tried a number of medications including trazodone, melatonin, Ambien ?- Pt reported taking Benadryl nightly and drinks 2 glasses of wine with ice  ? ? ? ? ?  ? ?Subjective:  ?Pt reported still having good urine output from IV lasix. ? ?Pt converted to NSR this morning, rate controlled. ? ? ?Physical Exam: ? ?Constitutional: NAD, AAOx3 ?HEENT: conjunctivae and lids normal, EOMI ?CV: No cyanosis.   ?RESP: normal respiratory effort, on 2L ?Extremities: No effusions, edema in BLE ?SKIN: warm, dry ?Neuro: II - XII grossly intact.   ?Psych: Normal mood and affect.  Appropriate judgement and reason ? ? ? ?Data Reviewed: ? ?Family Communication:  ? ?Disposition: ?Status is: Inpatient ? ? Planned Discharge Destination: Home  likely tomorrow if HR remains controlled ? ? ? ?Time spent: 35 minutes ? ?Author: ?Darlin Priestly, MD ?03/15/2022 11:37 PM ? ?For on call review [email or web address].  ?

## 2022-03-15 NOTE — Plan of Care (Signed)
?  Problem: Education: ?Goal: Knowledge of disease or condition will improve ?Outcome: Progressing ?  ?Problem: Activity: ?Goal: Ability to tolerate increased activity will improve ?Outcome: Progressing ?  ?Problem: Respiratory: ?Goal: Ability to maintain a clear airway will improve ?Outcome: Progressing ?Goal: Levels of oxygenation will improve ?Outcome: Progressing ?Goal: Ability to maintain adequate ventilation will improve ?Outcome: Progressing ?  ?Problem: Education: ?Goal: Knowledge of General Education information will improve ?Description: Including pain rating scale, medication(s)/side effects and non-pharmacologic comfort measures ?Outcome: Progressing ?  ?

## 2022-03-16 DIAGNOSIS — J9601 Acute respiratory failure with hypoxia: Secondary | ICD-10-CM

## 2022-03-16 DIAGNOSIS — E876 Hypokalemia: Secondary | ICD-10-CM

## 2022-03-16 DIAGNOSIS — I517 Cardiomegaly: Secondary | ICD-10-CM

## 2022-03-16 DIAGNOSIS — I5033 Acute on chronic diastolic (congestive) heart failure: Secondary | ICD-10-CM

## 2022-03-16 LAB — BASIC METABOLIC PANEL
Anion gap: 8 (ref 5–15)
BUN: 17 mg/dL (ref 8–23)
CO2: 35 mmol/L — ABNORMAL HIGH (ref 22–32)
Calcium: 9.1 mg/dL (ref 8.9–10.3)
Chloride: 91 mmol/L — ABNORMAL LOW (ref 98–111)
Creatinine, Ser: 0.73 mg/dL (ref 0.44–1.00)
GFR, Estimated: >60 mL/min (ref >60)
Glucose, Bld: 79 mg/dL (ref 70–99)
Potassium: 3.3 mmol/L — ABNORMAL LOW (ref 3.5–5.1)
Sodium: 134 mmol/L — ABNORMAL LOW (ref 135–145)

## 2022-03-16 LAB — CBC
HCT: 38.2 % (ref 36.0–46.0)
Hemoglobin: 12.5 g/dL (ref 12.0–15.0)
MCH: 31.3 pg (ref 26.0–34.0)
MCHC: 32.7 g/dL (ref 30.0–36.0)
MCV: 95.7 fL (ref 80.0–100.0)
Platelets: 256 10*3/uL (ref 150–400)
RBC: 3.99 MIL/uL (ref 3.87–5.11)
RDW: 11.9 % (ref 11.5–15.5)
WBC: 7.3 10*3/uL (ref 4.0–10.5)
nRBC: 0 % (ref 0.0–0.2)

## 2022-03-16 LAB — MAGNESIUM: Magnesium: 1.7 mg/dL (ref 1.7–2.4)

## 2022-03-16 MED ORDER — DILTIAZEM HCL ER COATED BEADS 180 MG PO CP24
180.0000 mg | ORAL_CAPSULE | Freq: Every day | ORAL | Status: DC
  Administered 2022-03-16: 180 mg via ORAL
  Filled 2022-03-16: qty 1

## 2022-03-16 MED ORDER — DILTIAZEM HCL ER COATED BEADS 180 MG PO CP24: 180.0000 mg | ORAL_CAPSULE | Freq: Every day | ORAL | 0 refills | Status: DC

## 2022-03-16 MED ORDER — APIXABAN 5 MG PO TABS: 5.0000 mg | ORAL_TABLET | Freq: Two times a day (BID) | ORAL | 0 refills | Status: DC

## 2022-03-16 MED ORDER — POTASSIUM CHLORIDE CRYS ER 20 MEQ PO TBCR: 20.0000 meq | EXTENDED_RELEASE_TABLET | Freq: Every day | ORAL | 0 refills | Status: DC

## 2022-03-16 MED ORDER — POTASSIUM CHLORIDE CRYS ER 20 MEQ PO TBCR
40.0000 meq | EXTENDED_RELEASE_TABLET | ORAL | Status: AC
  Administered 2022-03-16 (×3): 40 meq via ORAL
  Filled 2022-03-16 (×3): qty 2

## 2022-03-16 MED ORDER — FUROSEMIDE 40 MG PO TABS: 40.0000 mg | ORAL_TABLET | Freq: Every day | ORAL | 0 refills | Status: DC

## 2022-03-16 MED ORDER — DILTIAZEM HCL ER COATED BEADS 180 MG PO CP24
180.0000 mg | ORAL_CAPSULE | Freq: Every day | ORAL | 0 refills | Status: DC
Start: 2022-03-17 — End: 2022-04-18

## 2022-03-16 NOTE — Assessment & Plan Note (Signed)
This is a secondary to hypertrophic cardiomyopathy.  Patient echocardiogram showed ejection fraction 65 to 70% with moderate left ventricular outlet obstruction.  Patient has been treated with beta-blocker and IV Lasix.  Condition improved, blood pressure still elevated with a normal heart rate, added diltiazem to the regimen.  Patient will be referred  to cardiology in the near future. ?

## 2022-03-16 NOTE — Care Management Important Message (Signed)
Important Message ? ?Patient Details  ?Name: Stacey Sandoval ?MRN: [id number] ?Date of Birth: 03/01/1951 ? ? ?Medicare Important Message Given:  Yes ? ?Reviewed Medicare IM with patient via room phone due to isolation status.  Copy of Medicare IM sent to patient's home address on file.  ? ? ?Dannette Barbara ?03/16/2022, 10:11 AM ?

## 2022-03-16 NOTE — Discharge Summary (Signed)
?Physician Discharge Summary ?  ?Patient: Stacey Sandoval MRN: [id number] DOB: 22-Feb-1951  ?Admit date:     03/07/2022  ?Discharge date: 03/16/22  ?Discharge Physician: Marrion Coyekui Gumaro Brightbill  ? ?PCP: [facility], [facility]  ? ?Recommendations at discharge:  ? ?Follow-up with PCP in 1 week. ?Follow-up with cardiology in 2 weeks. ?Check BMP at the next office visit ? ?Discharge Diagnoses: ?Principal Problem: ?  COPD exacerbation (HCC) ?Active Problems: ?  Insomnia ?  Essential hypertension ?  Hyperlipidemia ?  Hyponatremia ?  Acute hypoxemic respiratory failure (HCC) ?  Rhinovirus infection ?  Current every day smoker ?  Obesity (BMI 30-39.9) ?  Atrial fibrillation with rapid ventricular response (HCC) ?  Hypomagnesemia ?  Acute on chronic diastolic CHF (congestive heart failure) (HCC) ?  Hypertrophic cardiomegaly ?  Hypokalemia ? ?Resolved Problems: ?  * No resolved hospital problems. * ? ?Hospital Course: ?AcuteMs. [name] is a 71 year old female insomnia, hypertension, hyperlipidemia, who presents emergency department for chief concerns of shortness of breath and cough. ? ?Patient was diagnosed with COPD exacerbation, chronic Congestive Heart Failure.  She Was Given IV Steroids, IV Lasix. ?Condition Had Improved, Currently She Is Medically Stable to Be Discharged. ? ?Assessment and Plan: ?* COPD exacerbation (HCC) ?Condition has improved, completed antibiotics and steroids.  Patient will need to follow-up with PCP as outpatient.  Patient currently does not have any additional bronchospasm. ? ?Hypokalemia ?Potassium still low today with 3.3, will give 120 mEq oral potassium, continue oral potassium supplement after discharge.  ?Check another BMP at the next office visit with PCP. ? ?Hypertrophic cardiomegaly ?Continue beta-blocker and calcium channel blocker.  Follow-up with PCP and cardiology as outpatient. ? ?Acute on chronic diastolic CHF (congestive heart failure) (HCC) ?This is a secondary to hypertrophic  cardiomyopathy.  Patient echocardiogram showed ejection fraction 65 to 70% with moderate left ventricular outlet obstruction.  Patient has been treated with beta-blocker and IV Lasix.  Condition improved, blood pressure still elevated with a normal heart rate, added diltiazem to the regimen.  Patient will be referred  to cardiology in the near future. ? ?Hypomagnesemia ?Improved ? ?Atrial fibrillation with rapid ventricular response (HCC) ?Patient had a new onset atrial fibrillation.  Condition had improved, currently in sinus.  Continue Eliquis started in the hospital.  Follow-up with cardiology as outpatient ? ?Obesity (BMI 30-39.9) ?BMI 31.25 ? ?Current every day smoker ?Advised to quit smoking, she has not been smoking since a week ago ? ?Rhinovirus infection ?Source of COPD exacerbation, condition had improved. ? ?Acute hypoxemic respiratory failure (HCC) ?Condition is improving.  Patient has good saturation on 2 L oxygen, will obtain home oxygen evaluation ? ? ? ?Hyponatremia ?Mild, follow-up with PCP as outpatient ? ?Hyperlipidemia ?- cont atorvastatin 20 mg daily ? ?Essential hypertension ?We will continue beta-blocker and calcium channel blocker. ? ?Insomnia ?Chronic insomnia, follow-up with PCP as outpatient. ? ? ? ? ?  ? ? ?Consultants: None ?Procedures performed: None  ?Disposition: Home health ?Diet recommendation:  ?Discharge Diet Orders (From admission, onward)  ? ?  Start     Ordered  ? 03/16/22 0000  Diet - low sodium heart healthy       ? 03/16/22 1042  ? ?  ?  ? ?  ? ?Cardiac diet ?DISCHARGE MEDICATION: ?Allergies as of 03/16/2022   ?No Known Allergies ?  ? ?  ?Medication List  ?  ? ?STOP taking these medications   ? ?valsartan-hydrochlorothiazide 320-25 MG tablet ?Commonly known as: DIOVAN-HCT ?  ? ?  ? ?  TAKE these medications   ? ?albuterol 108 (90 Base) MCG/ACT inhaler ?Commonly known as: VENTOLIN HFA ?SMARTSIG:1-2 Puff(s) By Mouth Every 4-6 Hours PRN ?What changed: Another medication with the  same name was added. Make sure you understand how and when to take each. ?  ?albuterol (2.5 MG/3ML) 0.083% nebulizer solution ?Commonly known as: PROVENTIL ?Take 3 mLs (2.5 mg total) by nebulization every 6 (six) hours as needed for wheezing or shortness of breath. ?What changed: You were already taking a medication with the same name, and this prescription was added. Make sure you understand how and when to take each. ?  ?apixaban 5 MG Tabs tablet ?Commonly known as: ELIQUIS ?Take 1 tablet (5 mg total) by mouth 2 (two) times daily. ?  ?atorvastatin 20 MG tablet ?Commonly known as: LIPITOR ?Take 20 mg by mouth daily. ?  ?celecoxib 200 MG capsule ?Commonly known as: CELEBREX ?SMARTSIG:1 Capsule(s) By Mouth 1-2 Times Daily ?  ?diltiazem 180 MG 24 hr capsule ?Commonly known as: CARDIZEM CD ?Take 1 capsule (180 mg total) by mouth daily. ?Start taking on: March 17, 2022 ?  ?famotidine 40 MG tablet ?Commonly known as: PEPCID ?Take 40 mg by mouth 2 (two) times daily. ?  ?furosemide 40 MG tablet ?Commonly known as: Lasix ?Take 1 tablet (40 mg total) by mouth daily. ?  ?gabapentin 300 MG capsule ?Commonly known as: NEURONTIN ?Take 600 mg by mouth 2 (two) times daily. ?  ?metoprolol succinate 25 MG 24 hr tablet ?Commonly known as: TOPROL-XL ?Take 25 mg by mouth daily. ?  ?mirtazapine 45 MG tablet ?Commonly known as: REMERON ?Take 45 mg by mouth at bedtime. ?  ?ondansetron 4 MG disintegrating tablet ?Commonly known as: ZOFRAN-ODT ?Take 4 mg by mouth every 8 (eight) hours as needed. ?  ?potassium chloride SA 20 MEQ tablet ?Commonly known as: KLOR-CON M ?Take 1 tablet (20 mEq total) by mouth daily. ?  ?tiZANidine 4 MG tablet ?Commonly known as: ZANAFLEX ?SMARTSIG:1 Tablet(s) By Mouth 1-3 Times Daily ?  ? ?  ? ?  ?  ? ? ?  ?Durable Medical Equipment  ?(From admission, onward)  ?  ? ? ?  ? ?  Start     Ordered  ? 03/13/22 1502  For home use only DME oxygen  Once       ?Question Answer Comment  ?Length of Need 6 Months   ?Mode or  (Route) Nasal cannula   ?Liters per Minute 2   ?Frequency Continuous (stationary and portable oxygen unit needed)   ?Oxygen delivery system Gas   ?  ? 03/13/22 1502  ? ?  ?  ? ?  ? ? Follow-up Information   ? ? Practice, [facility] Follow up in 1 week(s).   ?Why: 03/16/2022 2pm ?Contact information: ?[address] ?[place] [address] ?[phone] ? ? ?  ?  ? ? Care, [facility] Follow up.   ?Why: They will follow up with you for your home health needs: Physical therapy and nursing. ?Contact information: ?[address] ?[place] [address] ?[phone] ? ? ?  ?  ? ? Antonieta Iba, MD Follow up in 2 week(s).   ?Specialty: Cardiology ?Contact information: ?[address] ?[address] ?[place] [address] ?[phone] ? ? ?  ?  ? ?  ?  ? ?  ? ?Discharge Exam: ?Filed Weights  ? 03/07/22 1511 03/16/22 0400  ?Weight: 72.6 kg 72 kg  ? ?General exam: Appears calm and comfortable  ?  Respiratory system: Decreased breathing sounds. Respiratory effort normal. ?Cardiovascular system: S1 & S2 heard, RRR. No JVD, murmurs, rubs, gallops or clicks. No pedal edema. ?Gastrointestinal system: Abdomen is nondistended, soft and nontender. No organomegaly or masses felt. Normal bowel sounds heard. ?Central nervous system: Alert and oriented. No focal neurological deficits. ?Extremities: Symmetric 5 x 5 power. ?Skin: No rashes, lesions or ulcers ?Psychiatry: Judgement and insight appear normal. Mood & affect appropriate.  ? ? ?Condition at discharge: good ? ?The results of significant diagnostics from this hospitalization (including imaging, microbiology, ancillary and laboratory) are listed below for reference.  ? ?Imaging Studies: ?DG Chest 2 View ? ?Result Date: 03/07/2022 ?CLINICAL DATA:  Chest pain, cough EXAM: CHEST - 2 VIEW COMPARISON:  None. FINDINGS: Cardiac size is within normal limits. There are no signs of pulmonary edema. Increased markings are seen in the right lower lung fields. There  is diffuse haziness in the medial left upper lung fields. Apparent shift of mediastinum to the left may be due to rotation. Left lateral CP angle is indistinct. There is no pneumothorax. Dextroscoliosis i

## 2022-03-16 NOTE — Assessment & Plan Note (Signed)
Continue beta-blocker and calcium channel blocker.  Follow-up with PCP and cardiology as outpatient. ?

## 2022-03-16 NOTE — TOC Transition Note (Signed)
Transition of Care (TOC) - CM/SW Discharge Note ? ? ?Patient Details  ?Name: DYESHA HENAULT ?MRN: [id number] ?Date of Birth: June 15, 1951 ? ?Transition of Care (TOC) CM/SW Contact:  ?Gildardo Griffes, LCSW ?Phone Number: ?03/16/2022, 10:55 AM ? ? ?Clinical Narrative:    ? ?CSW informed Elnita Maxwell with [facility] of patient's dc today, patient has already received O2 from [facility]. No further dc needs identified.  ? ? ?Final next level of care: Home w Home Health Services ?Barriers to Discharge: No Barriers Identified ? ? ?Patient Goals and CMS Choice ?Patient states their goals for this hospitalization and ongoing recovery are:: to go home ?CMS Medicare.gov Compare Post Acute Care list provided to:: Patient ?Choice offered to / list presented to : Patient ? ?Discharge Placement ?  ?           ?  ?  ?  ?  ? ?Discharge Plan and Services ?  ?Discharge Planning Services: CM Consult ?           ?DME Arranged: Oxygen ?DME Agency: [facility] ?  ?  ?  ?HH Arranged: PT, RN ?HH Agency: [facility] ?  ?  ?  ? ?Social Determinants of Health (SDOH) Interventions ?  ? ? ?Readmission Risk Interventions ?   ? View : No data to display.  ?  ?  ?  ? ? ? ? ? ?

## 2022-03-16 NOTE — Assessment & Plan Note (Signed)
Potassium still low today with 3.3, will give 120 mEq oral potassium, continue oral potassium supplement after discharge.  ?Check another BMP at the next office visit with PCP. ?

## 2022-03-16 NOTE — Plan of Care (Signed)

## 2022-03-17 DIAGNOSIS — J449 Chronic obstructive pulmonary disease, unspecified: Secondary | ICD-10-CM | POA: Diagnosis not present

## 2022-03-17 DIAGNOSIS — J9601 Acute respiratory failure with hypoxia: Secondary | ICD-10-CM | POA: Diagnosis not present

## 2022-03-19 DIAGNOSIS — G4700 Insomnia, unspecified: Secondary | ICD-10-CM | POA: Diagnosis not present

## 2022-03-19 DIAGNOSIS — F17210 Nicotine dependence, cigarettes, uncomplicated: Secondary | ICD-10-CM | POA: Diagnosis not present

## 2022-03-19 DIAGNOSIS — I4891 Unspecified atrial fibrillation: Secondary | ICD-10-CM | POA: Diagnosis not present

## 2022-03-19 DIAGNOSIS — E785 Hyperlipidemia, unspecified: Secondary | ICD-10-CM | POA: Diagnosis not present

## 2022-03-19 DIAGNOSIS — B348 Other viral infections of unspecified site: Secondary | ICD-10-CM | POA: Diagnosis not present

## 2022-03-19 DIAGNOSIS — Z9981 Dependence on supplemental oxygen: Secondary | ICD-10-CM | POA: Diagnosis not present

## 2022-03-19 DIAGNOSIS — I129 Hypertensive chronic kidney disease with stage 1 through stage 4 chronic kidney disease, or unspecified chronic kidney disease: Secondary | ICD-10-CM | POA: Diagnosis not present

## 2022-03-19 DIAGNOSIS — Z7901 Long term (current) use of anticoagulants: Secondary | ICD-10-CM | POA: Diagnosis not present

## 2022-03-19 DIAGNOSIS — I422 Other hypertrophic cardiomyopathy: Secondary | ICD-10-CM | POA: Diagnosis not present

## 2022-03-19 DIAGNOSIS — I5033 Acute on chronic diastolic (congestive) heart failure: Secondary | ICD-10-CM | POA: Diagnosis not present

## 2022-03-19 DIAGNOSIS — J449 Chronic obstructive pulmonary disease, unspecified: Secondary | ICD-10-CM | POA: Diagnosis not present

## 2022-03-22 DIAGNOSIS — E042 Nontoxic multinodular goiter: Secondary | ICD-10-CM | POA: Diagnosis not present

## 2022-03-22 DIAGNOSIS — I509 Heart failure, unspecified: Secondary | ICD-10-CM | POA: Diagnosis not present

## 2022-03-22 DIAGNOSIS — Z79899 Other long term (current) drug therapy: Secondary | ICD-10-CM | POA: Diagnosis not present

## 2022-03-22 DIAGNOSIS — J9601 Acute respiratory failure with hypoxia: Secondary | ICD-10-CM | POA: Diagnosis not present

## 2022-03-22 DIAGNOSIS — R918 Other nonspecific abnormal finding of lung field: Secondary | ICD-10-CM | POA: Diagnosis not present

## 2022-03-22 DIAGNOSIS — J441 Chronic obstructive pulmonary disease with (acute) exacerbation: Secondary | ICD-10-CM | POA: Diagnosis not present

## 2022-03-22 DIAGNOSIS — I480 Paroxysmal atrial fibrillation: Secondary | ICD-10-CM | POA: Diagnosis not present

## 2022-03-23 ENCOUNTER — Other Ambulatory Visit: Payer: Self-pay | Admitting: Physician Assistant

## 2022-03-23 DIAGNOSIS — R918 Other nonspecific abnormal finding of lung field: Secondary | ICD-10-CM

## 2022-03-31 ENCOUNTER — Ambulatory Visit
Admission: RE | Admit: 2022-03-31 | Discharge: 2022-03-31 | Disposition: A | Discharge status: Home / Self Care | Payer: Medicare Other | Source: Ambulatory Visit | Attending: Physician Assistant | Admitting: Physician Assistant

## 2022-03-31 DIAGNOSIS — E785 Hyperlipidemia, unspecified: Secondary | ICD-10-CM | POA: Diagnosis not present

## 2022-03-31 DIAGNOSIS — I4891 Unspecified atrial fibrillation: Secondary | ICD-10-CM | POA: Diagnosis not present

## 2022-03-31 DIAGNOSIS — G4700 Insomnia, unspecified: Secondary | ICD-10-CM | POA: Diagnosis not present

## 2022-03-31 DIAGNOSIS — I129 Hypertensive chronic kidney disease with stage 1 through stage 4 chronic kidney disease, or unspecified chronic kidney disease: Secondary | ICD-10-CM | POA: Diagnosis not present

## 2022-03-31 DIAGNOSIS — J449 Chronic obstructive pulmonary disease, unspecified: Secondary | ICD-10-CM | POA: Diagnosis not present

## 2022-03-31 DIAGNOSIS — B348 Other viral infections of unspecified site: Secondary | ICD-10-CM | POA: Diagnosis not present

## 2022-03-31 DIAGNOSIS — R911 Solitary pulmonary nodule: Secondary | ICD-10-CM | POA: Diagnosis not present

## 2022-03-31 DIAGNOSIS — I5033 Acute on chronic diastolic (congestive) heart failure: Secondary | ICD-10-CM | POA: Diagnosis not present

## 2022-03-31 DIAGNOSIS — R918 Other nonspecific abnormal finding of lung field: Secondary | ICD-10-CM | POA: Diagnosis not present

## 2022-03-31 DIAGNOSIS — I422 Other hypertrophic cardiomyopathy: Secondary | ICD-10-CM | POA: Diagnosis not present

## 2022-03-31 DIAGNOSIS — Z7901 Long term (current) use of anticoagulants: Secondary | ICD-10-CM | POA: Diagnosis not present

## 2022-03-31 DIAGNOSIS — Z9981 Dependence on supplemental oxygen: Secondary | ICD-10-CM | POA: Diagnosis not present

## 2022-03-31 DIAGNOSIS — F17210 Nicotine dependence, cigarettes, uncomplicated: Secondary | ICD-10-CM | POA: Diagnosis not present

## 2022-03-31 MED ORDER — IOHEXOL 300 MG/ML  SOLN
75.0000 mL | Freq: Once | INTRAMUSCULAR | Status: AC | PRN
  Administered 2022-03-31: 75 mL via INTRAVENOUS

## 2022-04-01 ENCOUNTER — Other Ambulatory Visit: Payer: Self-pay

## 2022-04-01 ENCOUNTER — Emergency Department: Payer: Medicare Other

## 2022-04-01 ENCOUNTER — Emergency Department
Admission: EM | Admit: 2022-04-01 | Discharge: 2022-04-01 | Disposition: A | Discharge status: Home / Self Care | Payer: Medicare Other | Attending: Emergency Medicine | Admitting: Emergency Medicine

## 2022-04-01 DIAGNOSIS — I4891 Unspecified atrial fibrillation: Secondary | ICD-10-CM | POA: Insufficient documentation

## 2022-04-01 DIAGNOSIS — I10 Essential (primary) hypertension: Secondary | ICD-10-CM | POA: Diagnosis not present

## 2022-04-01 DIAGNOSIS — R000 Tachycardia, unspecified: Secondary | ICD-10-CM | POA: Diagnosis not present

## 2022-04-01 DIAGNOSIS — Z743 Need for continuous supervision: Secondary | ICD-10-CM | POA: Diagnosis not present

## 2022-04-01 DIAGNOSIS — Z7901 Long term (current) use of anticoagulants: Secondary | ICD-10-CM | POA: Diagnosis not present

## 2022-04-01 DIAGNOSIS — Z79899 Other long term (current) drug therapy: Secondary | ICD-10-CM | POA: Insufficient documentation

## 2022-04-01 DIAGNOSIS — R0602 Shortness of breath: Secondary | ICD-10-CM | POA: Diagnosis not present

## 2022-04-01 DIAGNOSIS — R0600 Dyspnea, unspecified: Secondary | ICD-10-CM | POA: Diagnosis not present

## 2022-04-01 DIAGNOSIS — R1110 Vomiting, unspecified: Secondary | ICD-10-CM | POA: Diagnosis not present

## 2022-04-01 DIAGNOSIS — R6889 Other general symptoms and signs: Secondary | ICD-10-CM | POA: Diagnosis not present

## 2022-04-01 DIAGNOSIS — I499 Cardiac arrhythmia, unspecified: Secondary | ICD-10-CM | POA: Diagnosis not present

## 2022-04-01 DIAGNOSIS — J449 Chronic obstructive pulmonary disease, unspecified: Secondary | ICD-10-CM | POA: Insufficient documentation

## 2022-04-01 LAB — COMPREHENSIVE METABOLIC PANEL
ALT: 18 U/L (ref 0–44)
AST: 22 U/L (ref 15–41)
Albumin: 3.8 g/dL (ref 3.5–5.0)
Alkaline Phosphatase: 58 U/L (ref 38–126)
Anion gap: 8 (ref 5–15)
BUN: 11 mg/dL (ref 8–23)
CO2: 32 mmol/L (ref 22–32)
Calcium: 9.4 mg/dL (ref 8.9–10.3)
Chloride: 99 mmol/L (ref 98–111)
Creatinine, Ser: 0.66 mg/dL (ref 0.44–1.00)
GFR, Estimated: >60 mL/min (ref >60)
Glucose, Bld: 109 mg/dL — ABNORMAL HIGH (ref 70–99)
Potassium: 3.5 mmol/L (ref 3.5–5.1)
Sodium: 139 mmol/L (ref 135–145)
Total Bilirubin: 0.8 mg/dL (ref 0.3–1.2)
Total Protein: 7 g/dL (ref 6.5–8.1)

## 2022-04-01 LAB — CBC
HCT: 39.8 % (ref 36.0–46.0)
Hemoglobin: 12.7 g/dL (ref 12.0–15.0)
MCH: 31.3 pg (ref 26.0–34.0)
MCHC: 31.9 g/dL (ref 30.0–36.0)
MCV: 98 fL (ref 80.0–100.0)
Platelets: 250 10*3/uL (ref 150–400)
RBC: 4.06 MIL/uL (ref 3.87–5.11)
RDW: 12.2 % (ref 11.5–15.5)
WBC: 4.1 10*3/uL (ref 4.0–10.5)
nRBC: 0 % (ref 0.0–0.2)

## 2022-04-01 LAB — TROPONIN I (HIGH SENSITIVITY)
Troponin I (High Sensitivity): 12 ng/L (ref <18)
Troponin I (High Sensitivity): 11 ng/L (ref <18)

## 2022-04-01 LAB — BRAIN NATRIURETIC PEPTIDE: B Natriuretic Peptide: 133.7 pg/mL — ABNORMAL HIGH (ref 0.0–100.0)

## 2022-04-01 MED ORDER — DILTIAZEM HCL 25 MG/5ML IV SOLN
10.0000 mg | Freq: Once | INTRAVENOUS | Status: AC
  Administered 2022-04-01: 10 mg via INTRAVENOUS
  Filled 2022-04-01: qty 5

## 2022-04-01 MED ORDER — DILTIAZEM HCL ER COATED BEADS 180 MG PO CP24
180.0000 mg | ORAL_CAPSULE | Freq: Once | ORAL | Status: AC
  Administered 2022-04-01: 180 mg via ORAL
  Filled 2022-04-01: qty 1

## 2022-04-01 NOTE — ED Notes (Signed)
Pt d/c home with daughter on home oxygen per MD order. Discharge summary reviewed, pt verbalizes understanding. Off unit via WC. No s/s of acute distress noted at discharge.  ?

## 2022-04-01 NOTE — ED Triage Notes (Signed)
Pt via [facility] with SOB. Recently admitted for PNA and diagnosed with afib at that time. Pt reports that she wears home oxygen at 2L, wearing 3L on arrival.  ?

## 2022-04-01 NOTE — Discharge Instructions (Signed)
Please follow-up with your doctor regarding today's ER visit.  Return to the emergency department for any further shortness of breath or any chest pain or any other symptom personally concerning to yourself. ?

## 2022-04-01 NOTE — ED Provider Notes (Signed)
? ?[facility] ?Provider Note ? ? ? Event Date/Time  ? First MD Initiated Contact with Patient 04/01/22 1033   ?  (approximate) ? ?History  ? ?Chief Complaint: Shortness of Breath ? ?HPI ? ?Stacey Sandoval is a 71 y.o. female with a past medical history of COPD, hypertension, hyperlipidemia newly diagnosed atrial fibrillation 2 weeks ago presents to the emergency department for shortness of breath.  According to the patient after waking up and walking around her house this morning she began feeling very short of breath.  States she felt like she could not catch her breath.  EMS noted the patient to be in A-fib RVR around 120 to 130 bpm.  Patient states she was just diagnosed with atrial fibrillation 2 weeks ago and placed on diltiazem 180 mg daily.  Patient states she has not taken her diltiazem today yet.  States she is feeling somewhat better currently.  Record review shows patient is now taking Eliquis 5 mg twice daily as well as diltiazem 180 mg once daily. ? ?Physical Exam  ? ?Triage Vital Signs: ?ED Triage Vitals  ?Enc Vitals Group  ?   BP 04/01/22 1039 (!) 130/112  ?   Pulse Rate 04/01/22 1039 (!) 115  ?   Resp 04/01/22 1039 (!) 22  ?   Temp 04/01/22 1039 98 ?F (36.7 ?C)  ?   Temp Source 04/01/22 1039 Oral  ?   SpO2 04/01/22 1039 96 %  ?   Weight 04/01/22 1038 158 lb 11.7 oz (72 kg)  ?   Height 04/01/22 1038 5\' 1"  (1.549 m)  ?   Head Circumference --   ?   Peak Flow --   ?   Pain Score 04/01/22 1038 0  ?   Pain Loc --   ?   Pain Edu? --   ?   Excl. in GC? --   ? ? ?Most recent vital signs: ?Vitals:  ? 04/01/22 1039  ?BP: (!) 130/112  ?Pulse: (!) 115  ?Resp: (!) 22  ?Temp: 98 ?F (36.7 ?C)  ?SpO2: 96%  ? ? ?General: Awake, no distress.  ?CV:  Irregular rhythm rate around 120 bpm. ?Resp:  Slight tachypnea around 20 to 25 breaths/min.  Equal breath sounds bilaterally.  No obvious wheeze rales or rhonchi. ?Abd:  No distention.  Soft, nontender.  No rebound or guarding. ? ? ? ?ED Results /  Procedures / Treatments  ? ?EKG ? ?EKG viewed and interpreted by myself shows atrial fibrillation at 104 bpm with a narrow QRS, normal axis, normal intervals with nonspecific but no concerning ST changes. ? ?RADIOLOGY ? ?I personally viewed the chest x-ray image, no acute abnormality seen on my evaluation. ?Radiology has read the x-ray as stable lower lung scarring and goiter. ? ? ?MEDICATIONS ORDERED IN ED: ?Medications  ?diltiazem (CARDIZEM) injection 10 mg (has no administration in time range)  ? ? ? ?IMPRESSION / MDM / ASSESSMENT AND PLAN / ED COURSE  ?I reviewed the triage vital signs and the nursing notes. ? ?Patient presents to the emergency department for shortness of breath found to be atrial fibrillation with rapid ventricular response from 120 to 130 bpm.  Patient given 10 mg of IV diltiazem.  Patient was recently diagnosed with atrial fibrillation approximately 2 weeks ago per record review.  I reviewed the patient's discharge summary from 03/16/2022, at that time patient was admitted for COPD exacerbation found to be in atrial fibrillation with RVR which is new.  Started on diltiazem and Eliquis.  Here the patient appears well she is mildly tachypneic and she is tachycardic.  Patient received IV diltiazem we will check labs including cardiac enzymes, EKG and a chest x-ray.  We will continue to closely monitor.  Differential would include COPD exacerbation, dyspnea due to A-fib RVR, ACS, pulmonary/interstitial edema, pneumonia, pneumothorax. ? ?Patient's work-up is overall reassuring.  Troponin negative x2.  Chemistry is normal.  CBC is normal.  Patient's heart rate is maintaining in the 70s/80s after IV and now oral diltiazem.  Patient states she feels much better.  Given the patient's reassuring work-up I highly suspect the dyspnea was due to her A-fib with RVR.  Patient will follow-up with her doctor.  I did discuss return precautions. ? ? ? ? ?CRITICAL CARE ?Performed by: Minna Antis ? ? ?Total  critical care time: 30 minutes ? ?Critical care time was exclusive of separately billable procedures and treating other patients. ? ?Critical care was necessary to treat or prevent imminent or life-threatening deterioration. ? ?Critical care was time spent personally by me on the following activities: development of treatment plan with patient and/or surrogate as well as nursing, discussions with consultants, evaluation of patient's response to treatment, examination of patient, obtaining history from patient or surrogate, ordering and performing treatments and interventions, ordering and review of laboratory studies, ordering and review of radiographic studies, pulse oximetry and re-evaluation of patient's condition. ? ? ?FINAL CLINICAL IMPRESSION(S) / ED DIAGNOSES  ? ?Atrial fibrillation with rapid ventricular response ?Dyspnea ? ?Note:  This document was prepared using Dragon voice recognition software and may include unintentional dictation errors. ?  ?Minna Antis, MD ?04/01/22 1333 ? ?

## 2022-04-15 NOTE — Progress Notes (Signed)
Cardiology Office Note ? ?Date:  04/18/2022  ? ?ID:  Stacey Sandoval, DOB [birth year], MRN [id number] ? ?PCP:  Practice, [facility]  ? ?Chief Complaint  ?Patient presents with  ? New Patient (Initial Visit)  ?  Follow up [facility]; A-Fib & CHF. Patient c/o LE edema and shortness of breath. Medications reviewed by the patient verbally.   ? ? ?HPI:  ?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 ?Noted to be in atrial fibrillation in the hospital, started on Eliquis per the notes ? ?echocardiogram dated March 08, 2022  ?showed ejection fraction 65 to 70% with moderate asymmetric left ventricular hypertrophy of the basal-septal segment. ?Moderate LVOT obstruction  with peak gradient of 50 mm Hg at rest and 59 mm Hg with Valsalva.  ? ?March 27th was NSR, admitted to the hospital ?March 31st was in atrial fib, presumably from nebulizer treatment in the setting of COPD ?During her hospital course it was documented she converted back to normal sinus rhythm with rate controlling agents, not antiarrhythmics ? ?Was at home when she developed acute shortness of breath went back to the emergency room ?April 21st back to the ER, atrial fibrillation, had SOB ?In atrial fibrillation on today's EKG ? ?It appears that the time of her discharge from the hospital her valsartan HCTZ was held.  We will update her medication list ? ?Still smoking 5-6 a day ?On chronic home oxygen 2 L, has a portable generator ?Has not established with pulmonary ? ?EKG personally reviewed by myself on todays visit ?Normal sinus rhythm with rate 84 bpm no significant ST-T wave changes ? ? ?PMH:   has a past medical history of COPD (chronic obstructive pulmonary disease) (HCC), Hyperlipidemia,  Hypertension, and Tobacco use. ? ?PSH:   History reviewed. No pertinent surgical history. ? ?Current Outpatient Medications  ?Medication Sig Dispense Refill  ? albuterol (PROVENTIL) (2.5 MG/3ML) 0.083% nebulizer solution Take 3 mLs (2.5 mg total) by nebulization every 6 (six) hours as needed for wheezing or shortness of breath. 75 mL 2  ? albuterol (VENTOLIN HFA) 108 (90 Base) MCG/ACT inhaler SMARTSIG:1-2 Puff(s) By Mouth Every 4-6 Hours PRN    ? apixaban (ELIQUIS) 5 MG TABS tablet Take 1 tablet (5 mg total) by mouth 2 (two) times daily. 60 tablet 0  ? atorvastatin (LIPITOR) 20 MG tablet Take 20 mg by mouth daily.    ? diltiazem (CARDIZEM CD) 180 MG 24 hr capsule Take 1 capsule (180 mg total) by mouth daily. 30 capsule 0  ? famotidine (PEPCID) 40 MG tablet Take 40 mg by mouth 2 (two) times daily.    ? furosemide (LASIX) 40 MG tablet Take 1 tablet (40 mg total) by mouth daily. 30 tablet 0  ? gabapentin (NEURONTIN) 300 MG capsule Take 600 mg by mouth 2 (two) times daily.    ? metoprolol succinate (TOPROL-XL) 25 MG 24 hr tablet Take 25 mg by mouth daily.    ? mirtazapine (REMERON) 45 MG tablet Take 45 mg by mouth at bedtime.    ? ondansetron (ZOFRAN-ODT) 4 MG disintegrating tablet Take 4 mg by mouth every 8 (eight) hours as needed.    ? potassium chloride SA (KLOR-CON M) 20 MEQ tablet Take 1 tablet (20 mEq total) by mouth daily. 30 tablet 0  ?  celecoxib (CELEBREX) 200 MG capsule SMARTSIG:1 Capsule(s) By Mouth 1-2 Times Daily (Patient not taking: Reported on 04/18/2022)    ? tiZANidine (ZANAFLEX) 4 MG tablet SMARTSIG:1 Tablet(s) By Mouth 1-3 Times Daily (Patient not taking: Reported on 04/18/2022)    ? triamterene-hydrochlorothiazide (MAXZIDE) 75-50 MG tablet Take 1 tablet by mouth daily. (Patient not taking: Reported on 04/18/2022)    ? valsartan (DIOVAN) 320 MG tablet Take 320 mg by mouth daily. (Patient not taking: Reported on 04/18/2022)    ? ?No current facility-administered medications for this visit.  ? ? ?Allergies:    Patient has no known allergies.  ? ?Social History:  The patient  reports that she has been smoking cigarettes. She has a 30.00 pack-year smoking history. She has quit using smokeless tobacco. She reports current alcohol use of about 7.0 standard drinks per week. She reports that she does not use drugs.  ? ?Family History:   family history includes Heart attack in her father; Heart disease in her father and mother; Hypertension in her mother.  ? ? ?Review of Systems: ?Review of Systems  ?Constitutional: Negative.   ?HENT: Negative.    ?Respiratory:  Positive for shortness of breath.   ?Cardiovascular: Negative.   ?Gastrointestinal: Negative.   ?Musculoskeletal: Negative.   ?Neurological: Negative.   ?Psychiatric/Behavioral: Negative.    ?All other systems reviewed and are negative. ? ? ?PHYSICAL EXAM: ?VS:  BP 140/70 (BP Location: Left Arm, Patient Position: Sitting, Cuff Size: Normal)   Pulse 84   Ht 5\' 1"  (1.549 m)   Wt 154 lb 4 oz (70 kg)   SpO2 97% Comment: on 2 liters of oxygen  BMI 29.15 kg/m?  , BMI Body mass index is 29.15 kg/m?. ?GEN: Well nourished, well developed, in no acute distress ?HEENT: normal ?Neck: no JVD, carotid bruits, or masses ?Cardiac: RRR; no murmurs, rubs, or gallops,no edema  ?Respiratory:  clear to auscultation bilaterally, normal work of breathing ?Decreased breath sounds throughout ?GI: soft, nontender, nondistended, + BS ?MS: no deformity or atrophy ?Skin: warm and dry, no rash ?Neuro:  Strength and sensation are intact ?Psych: euthymic mood, full affect ? ?Recent Labs: ?03/07/2022: TSH 0.208 ?03/16/2022: Magnesium 1.7 ?04/01/2022: ALT 18; B Natriuretic Peptide 133.7; BUN 11; Creatinine, Ser 0.66; Hemoglobin 12.7; Platelets 250; Potassium 3.5; Sodium 139  ? ? ?Lipid Panel ?No results found for: CHOL, HDL, LDLCALC, TRIG ?  ? ?Wt Readings from Last 3 Encounters:  ?04/18/22 154 lb 4 oz (70 kg)  ?04/01/22 158 lb 11.7 oz (72 kg)  ?03/16/22 158 lb 11.7 oz (72 kg)  ?  ? ?ASSESSMENT AND  PLAN: ? ?Problem List Items Addressed This Visit   ? ?  ? Cardiology Problems  ? Essential hypertension  ? Atrial fibrillation with rapid ventricular response (HCC) - Primary  ? Acute on chronic diastolic CHF (congestive heart failure) (HCC)  ? Hypertrophic cardiomegaly  ? Hyperlipidemia  ? ?Paroxysmal atrial fibrillation ?In the setting of COPD exacerbation, recurrence of A-fib April 01, 2022 in the ER ?Recommend she change her metoprolol succinate to bisoprolol 5 mg twice daily ?For recurrent atrial fibrillation spells recommend she take extra dose bisoprolol with diltiazem 30 as needed ?We will try to avoid antiarrhythmic such as amiodarone given underlying lung disease ?Stay on Eliquis ? ?COPD with recent exacerbation ?Recent hospitalization records reviewed ?She continues to smoke 5 to 6 cigarettes/day, on chronic oxygen ?On inhalers, referral made for pulmonary evaluation ? ?Hypertrophic cardiomyopathy ?Noted presumably in the setting of atrial fibrillation ?No significant  murmur on exam ?We will hold off on further work-up at this time, continue beta-blocker, calcium channel blocker, A-fib control ? ? ? Total encounter time more than 50 minutes ? Greater than 50% was spent in counseling and coordination of care with the patient ? ? ? ?Signed, ?Dossie Arbourim Starlette Thurow, M.D., Ph.D. ?[facility] [place] ?[phone] ?

## 2022-04-16 DIAGNOSIS — J449 Chronic obstructive pulmonary disease, unspecified: Secondary | ICD-10-CM | POA: Diagnosis not present

## 2022-04-16 DIAGNOSIS — J9601 Acute respiratory failure with hypoxia: Secondary | ICD-10-CM | POA: Diagnosis not present

## 2022-04-18 ENCOUNTER — Encounter: Payer: Self-pay | Admitting: Cardiovascular Disease

## 2022-04-18 ENCOUNTER — Ambulatory Visit: Payer: Medicare Other | Admitting: Cardiovascular Disease

## 2022-04-18 VITALS — BP 140/70 | HR 84 | Ht 61.0 in | Wt 154.2 lb

## 2022-04-18 DIAGNOSIS — I10 Essential (primary) hypertension: Secondary | ICD-10-CM

## 2022-04-18 DIAGNOSIS — J449 Chronic obstructive pulmonary disease, unspecified: Secondary | ICD-10-CM

## 2022-04-18 DIAGNOSIS — E782 Mixed hyperlipidemia: Secondary | ICD-10-CM

## 2022-04-18 DIAGNOSIS — I4891 Unspecified atrial fibrillation: Secondary | ICD-10-CM | POA: Diagnosis not present

## 2022-04-18 DIAGNOSIS — I517 Cardiomegaly: Secondary | ICD-10-CM

## 2022-04-18 DIAGNOSIS — I5033 Acute on chronic diastolic (congestive) heart failure: Secondary | ICD-10-CM

## 2022-04-18 MED ORDER — DILTIAZEM HCL ER COATED BEADS 180 MG PO CP24: 180.0000 mg | ORAL_CAPSULE | Freq: Every day | ORAL | 3 refills | Status: DC

## 2022-04-18 MED ORDER — POTASSIUM CHLORIDE CRYS ER 20 MEQ PO TBCR: EXTENDED_RELEASE_TABLET | ORAL | 3 refills | Status: DC

## 2022-04-18 MED ORDER — BISOPROLOL FUMARATE 5 MG PO TABS: 5.0000 mg | ORAL_TABLET | Freq: Every day | ORAL | 3 refills | Status: DC

## 2022-04-18 MED ORDER — DILTIAZEM HCL 30 MG PO TABS: 30.0000 mg | ORAL_TABLET | ORAL | 3 refills | Status: AC | PRN

## 2022-04-18 MED ORDER — APIXABAN 5 MG PO TABS: 5.0000 mg | ORAL_TABLET | Freq: Two times a day (BID) | ORAL | 3 refills | Status: DC

## 2022-04-18 NOTE — Patient Instructions (Addendum)
Medication Instructions:  ?Please increase the potassium up to 1 daily and 2 every other day ? ?Hold the metoprolol ?Please start bisoprolol 5 mg twice a day ? ?If you go into atrial fibrillation, ?Take extra bisoprolol 5 mg x 1 and take diltiazem 30 mg pill  ? ?If you need a refill on your cardiac medications before your next appointment, please call your pharmacy.  ? ?Lab work: ?No new labs needed ? ?Testing/Procedures: ?No new testing needed ? ? ?You have been referred to pulmonology.  ? ?Follow-Up: ?At [facility], you and your health needs are our priority.  As part of our continuing mission to provide you with exceptional heart care, we have created designated Provider Care Teams.  These Care Teams include your primary Cardiologist (physician) and Advanced Practice Providers (APPs -  Physician Assistants and Nurse Practitioners) who all work together to provide you with the care you need, when you need it. ? ?You will need a follow up appointment in 3 months ? ?Providers on your designated Care Team:   ?Nicolasa Ducking, NP ?Eula Listen, PA-C ?Cadence Fransico Michael, PA-C ? ?COVID-19 Vaccine Information can be found at: [email or web address] For questions related to vaccine distribution or appointments, please email [email or web address] or call [phone].  ? ?

## 2022-04-22 DIAGNOSIS — J449 Chronic obstructive pulmonary disease, unspecified: Secondary | ICD-10-CM | POA: Diagnosis not present

## 2022-04-22 DIAGNOSIS — I129 Hypertensive chronic kidney disease with stage 1 through stage 4 chronic kidney disease, or unspecified chronic kidney disease: Secondary | ICD-10-CM | POA: Diagnosis not present

## 2022-04-22 DIAGNOSIS — E785 Hyperlipidemia, unspecified: Secondary | ICD-10-CM | POA: Diagnosis not present

## 2022-04-22 DIAGNOSIS — I5033 Acute on chronic diastolic (congestive) heart failure: Secondary | ICD-10-CM | POA: Diagnosis not present

## 2022-04-22 DIAGNOSIS — Z9981 Dependence on supplemental oxygen: Secondary | ICD-10-CM | POA: Diagnosis not present

## 2022-04-22 DIAGNOSIS — B348 Other viral infections of unspecified site: Secondary | ICD-10-CM | POA: Diagnosis not present

## 2022-04-22 DIAGNOSIS — I4891 Unspecified atrial fibrillation: Secondary | ICD-10-CM | POA: Diagnosis not present

## 2022-04-22 DIAGNOSIS — F17210 Nicotine dependence, cigarettes, uncomplicated: Secondary | ICD-10-CM | POA: Diagnosis not present

## 2022-04-22 DIAGNOSIS — G4700 Insomnia, unspecified: Secondary | ICD-10-CM | POA: Diagnosis not present

## 2022-04-22 DIAGNOSIS — Z7901 Long term (current) use of anticoagulants: Secondary | ICD-10-CM | POA: Diagnosis not present

## 2022-04-22 DIAGNOSIS — I422 Other hypertrophic cardiomyopathy: Secondary | ICD-10-CM | POA: Diagnosis not present

## 2022-04-25 DIAGNOSIS — H2513 Age-related nuclear cataract, bilateral: Secondary | ICD-10-CM | POA: Diagnosis not present

## 2022-04-26 DIAGNOSIS — E042 Nontoxic multinodular goiter: Secondary | ICD-10-CM | POA: Diagnosis not present

## 2022-04-26 DIAGNOSIS — I509 Heart failure, unspecified: Secondary | ICD-10-CM | POA: Diagnosis not present

## 2022-04-26 DIAGNOSIS — I480 Paroxysmal atrial fibrillation: Secondary | ICD-10-CM | POA: Diagnosis not present

## 2022-04-26 DIAGNOSIS — Z79899 Other long term (current) drug therapy: Secondary | ICD-10-CM | POA: Diagnosis not present

## 2022-04-26 DIAGNOSIS — J9601 Acute respiratory failure with hypoxia: Secondary | ICD-10-CM | POA: Diagnosis not present

## 2022-05-17 DIAGNOSIS — J449 Chronic obstructive pulmonary disease, unspecified: Secondary | ICD-10-CM | POA: Diagnosis not present

## 2022-05-17 DIAGNOSIS — J9601 Acute respiratory failure with hypoxia: Secondary | ICD-10-CM | POA: Diagnosis not present

## 2022-05-17 DIAGNOSIS — E042 Nontoxic multinodular goiter: Secondary | ICD-10-CM | POA: Diagnosis not present

## 2022-05-26 DIAGNOSIS — H2511 Age-related nuclear cataract, right eye: Secondary | ICD-10-CM | POA: Diagnosis not present

## 2022-06-03 ENCOUNTER — Other Ambulatory Visit: Payer: Self-pay

## 2022-06-03 MED ORDER — BISOPROLOL FUMARATE 5 MG PO TABS: 5.0000 mg | ORAL_TABLET | Freq: Two times a day (BID) | ORAL | 0 refills | Status: DC

## 2022-06-08 ENCOUNTER — Encounter: Payer: Self-pay | Admitting: Ophthalmology

## 2022-06-16 DIAGNOSIS — J449 Chronic obstructive pulmonary disease, unspecified: Secondary | ICD-10-CM | POA: Diagnosis not present

## 2022-06-16 DIAGNOSIS — J9601 Acute respiratory failure with hypoxia: Secondary | ICD-10-CM | POA: Diagnosis not present

## 2022-06-16 NOTE — Discharge Instructions (Signed)

## 2022-06-21 ENCOUNTER — Encounter
Admission: RE | Disposition: A | Discharge status: Home / Self Care | Payer: Self-pay | Source: Home / Self Care | Attending: Ophthalmology

## 2022-06-21 ENCOUNTER — Encounter: Payer: Self-pay | Admitting: Ophthalmology

## 2022-06-21 ENCOUNTER — Ambulatory Visit: Payer: Medicare Other | Admitting: Anesthesiology

## 2022-06-21 ENCOUNTER — Ambulatory Visit
Admission: RE | Admit: 2022-06-21 | Discharge: 2022-06-21 | Disposition: A | Discharge status: Home / Self Care | Payer: Medicare Other | Attending: Ophthalmology | Admitting: Ophthalmology

## 2022-06-21 ENCOUNTER — Other Ambulatory Visit: Payer: Self-pay

## 2022-06-21 DIAGNOSIS — J449 Chronic obstructive pulmonary disease, unspecified: Secondary | ICD-10-CM | POA: Diagnosis not present

## 2022-06-21 DIAGNOSIS — Z9981 Dependence on supplemental oxygen: Secondary | ICD-10-CM | POA: Insufficient documentation

## 2022-06-21 DIAGNOSIS — Z7901 Long term (current) use of anticoagulants: Secondary | ICD-10-CM | POA: Diagnosis not present

## 2022-06-21 DIAGNOSIS — I509 Heart failure, unspecified: Secondary | ICD-10-CM | POA: Diagnosis not present

## 2022-06-21 DIAGNOSIS — I499 Cardiac arrhythmia, unspecified: Secondary | ICD-10-CM | POA: Diagnosis not present

## 2022-06-21 DIAGNOSIS — I4891 Unspecified atrial fibrillation: Secondary | ICD-10-CM | POA: Diagnosis not present

## 2022-06-21 DIAGNOSIS — H2512 Age-related nuclear cataract, left eye: Secondary | ICD-10-CM | POA: Diagnosis not present

## 2022-06-21 DIAGNOSIS — F17210 Nicotine dependence, cigarettes, uncomplicated: Secondary | ICD-10-CM | POA: Diagnosis not present

## 2022-06-21 DIAGNOSIS — F17200 Nicotine dependence, unspecified, uncomplicated: Secondary | ICD-10-CM | POA: Diagnosis not present

## 2022-06-21 DIAGNOSIS — I110 Hypertensive heart disease with heart failure: Secondary | ICD-10-CM | POA: Diagnosis not present

## 2022-06-21 DIAGNOSIS — H2511 Age-related nuclear cataract, right eye: Secondary | ICD-10-CM | POA: Diagnosis not present

## 2022-06-21 DIAGNOSIS — E785 Hyperlipidemia, unspecified: Secondary | ICD-10-CM | POA: Insufficient documentation

## 2022-06-21 HISTORY — DX: Heart failure, unspecified: I50.9

## 2022-06-21 HISTORY — DX: Simple chronic bronchitis: J41.0

## 2022-06-21 HISTORY — PX: CATARACT EXTRACTION W/PHACO: SHX586

## 2022-06-21 HISTORY — DX: Other complications of anesthesia, initial encounter: T88.59XA

## 2022-06-21 HISTORY — DX: Presence of dental prosthetic device (complete) (partial): Z97.2

## 2022-06-21 HISTORY — DX: Unspecified atrial fibrillation: I48.91

## 2022-06-21 HISTORY — DX: Nontoxic goiter, unspecified: E04.9

## 2022-06-21 SURGERY — PHACOEMULSIFICATION, CATARACT, WITH IOL INSERTION
Anesthesia: Monitor Anesthesia Care | Site: Eye | Laterality: Right

## 2022-06-21 MED ORDER — SIGHTPATH DOSE#1 BSS IO SOLN
INTRAOCULAR | Status: DC | PRN
  Administered 2022-06-21: 57 mL via OPHTHALMIC

## 2022-06-21 MED ORDER — SIGHTPATH DOSE#1 BSS IO SOLN
INTRAOCULAR | Status: DC | PRN
  Administered 2022-06-21: 1 mL via INTRAMUSCULAR

## 2022-06-21 MED ORDER — FENTANYL CITRATE (PF) 100 MCG/2ML IJ SOLN
INTRAMUSCULAR | Status: DC | PRN
  Administered 2022-06-21: 50 ug via INTRAVENOUS

## 2022-06-21 MED ORDER — PHENYLEPHRINE HCL 10 % OP SOLN
3.0000 [drp] | Freq: Once | OPHTHALMIC | Status: AC
  Administered 2022-06-21: 3 [drp] via OPHTHALMIC

## 2022-06-21 MED ORDER — MIDAZOLAM HCL 2 MG/2ML IJ SOLN
INTRAMUSCULAR | Status: DC | PRN
  Administered 2022-06-21: .5 mg via INTRAVENOUS

## 2022-06-21 MED ORDER — ONDANSETRON HCL 4 MG/2ML IJ SOLN: 4.0000 mg | Freq: Once | INTRAMUSCULAR | Status: DC | PRN

## 2022-06-21 MED ORDER — TETRACAINE HCL 0.5 % OP SOLN
1.0000 [drp] | OPHTHALMIC | Status: DC | PRN
  Administered 2022-06-21 (×3): 1 [drp] via OPHTHALMIC

## 2022-06-21 MED ORDER — ACETAMINOPHEN 160 MG/5ML PO SOLN: 325.0000 mg | ORAL | Status: DC | PRN

## 2022-06-21 MED ORDER — LACTATED RINGERS IV SOLN: INTRAVENOUS | Status: DC

## 2022-06-21 MED ORDER — BRIMONIDINE TARTRATE-TIMOLOL 0.2-0.5 % OP SOLN
OPHTHALMIC | Status: DC | PRN
  Administered 2022-06-21: 1 [drp] via OPHTHALMIC

## 2022-06-21 MED ORDER — MOXIFLOXACIN HCL 0.5 % OP SOLN
OPHTHALMIC | Status: DC | PRN
  Administered 2022-06-21: 0.2 mL via OPHTHALMIC

## 2022-06-21 MED ORDER — SIGHTPATH DOSE#1 NA CHONDROIT SULF-NA HYALURON 40-17 MG/ML IO SOLN
INTRAOCULAR | Status: DC | PRN
  Administered 2022-06-21: 1 mL via INTRAOCULAR

## 2022-06-21 MED ORDER — ACETAMINOPHEN 325 MG PO TABS: 650.0000 mg | ORAL_TABLET | ORAL | Status: DC | PRN

## 2022-06-21 MED ORDER — ARMC OPHTHALMIC DILATING DROPS
1.0000 | OPHTHALMIC | Status: DC | PRN
  Administered 2022-06-21 (×3): 1 via OPHTHALMIC

## 2022-06-21 MED ORDER — SIGHTPATH DOSE#1 BSS IO SOLN
INTRAOCULAR | Status: DC | PRN
  Administered 2022-06-21: 15 mL

## 2022-06-21 SURGICAL SUPPLY — 11 items
CATARACT SUITE SIGHTPATH (MISCELLANEOUS) ×2 IMPLANT
FEE CATARACT SUITE SIGHTPATH (MISCELLANEOUS) ×1 IMPLANT
GLOVE SURG ENC TEXT LTX SZ8 (GLOVE) ×2 IMPLANT
GLOVE SURG TRIUMPH 8.0 PF LTX (GLOVE) ×2 IMPLANT
LENS IOL TECNIS EYHANCE 25.0 (Intraocular Lens) ×1 IMPLANT
NDL FILTER BLUNT 18X1 1/2 (NEEDLE) ×1 IMPLANT
NEEDLE FILTER BLUNT 18X 1/2SAF (NEEDLE) ×1
NEEDLE FILTER BLUNT 18X1 1/2 (NEEDLE) ×1 IMPLANT
RING MALYGIN (MISCELLANEOUS) ×1 IMPLANT
SYR 3ML LL SCALE MARK (SYRINGE) ×2 IMPLANT
WATER STERILE IRR 250ML POUR (IV SOLUTION) ×2 IMPLANT

## 2022-06-21 NOTE — H&P (Signed)
[facility]   Primary Care Physician:  Practice, [facility] Family Ophthalmologist: Dr. Druscilla Brownie  Pre-Procedure History & Physical: HPI:  Stacey Sandoval is a 71 y.o. female here for cataract surgery.   Past Medical History:  Diagnosis Date   Atrial fibrillation (HCC)    CHF (congestive heart failure) (HCC)    Complication of anesthesia    VERY slow to wake after hysterectomy   COPD (chronic obstructive pulmonary disease) (HCC)    Enlarged thyroid    has been referred to [facility]   Hyperlipidemia    Hypertension    Smokers' cough (HCC)    Tobacco use    Wears dentures    Full upper and lower    Past Surgical History:  Procedure Laterality Date   ABDOMINAL HYSTERECTOMY  1990   BREAST LUMPECTOMY Right 1974   CHOLECYSTECTOMY  2013    Prior to Admission medications   Medication Sig Start Date End Date Taking? Authorizing Provider  apixaban (ELIQUIS) 5 MG TABS tablet Take 1 tablet (5 mg total) by mouth 2 (two) times daily. 04/18/22  Yes Gollan, Tollie Pizza, MD  atorvastatin (LIPITOR) 20 MG tablet Take 20 mg by mouth daily. 02/23/22  Yes [provider]  bisoprolol (ZEBETA) 5 MG tablet Take 1 tablet (5 mg total) by mouth 2 (two) times daily. Patient taking differently: Take 5 mg by mouth daily. 06/03/22  Yes Gollan, Tollie Pizza, MD  diltiazem (CARDIZEM CD) 180 MG 24 hr capsule Take 1 capsule (180 mg total) by mouth daily. 04/18/22  Yes Gollan, Tollie Pizza, MD  diltiazem (CARDIZEM) 30 MG tablet Take 1 tablet (30 mg total) by mouth as needed (atrial fibrillation). 04/18/22  Yes Antonieta Iba, MD  famotidine (PEPCID) 40 MG tablet Take 40 mg by mouth 2 (two) times daily. 02/14/22  Yes [provider]  furosemide (LASIX) 40 MG tablet Take 1 tablet (40 mg total) by mouth daily. 03/16/22 06/21/22 Yes Marrion Coy, MD  gabapentin (NEURONTIN) 300 MG capsule Take 600 mg by mouth 2 (two) times daily. 02/23/22  Yes [provider]  mirtazapine (REMERON) 45 MG tablet Take  45 mg by mouth at bedtime. 02/01/22  Yes [provider]  OXYGEN Inhale into the lungs as needed.   Yes [provider]  potassium chloride SA (KLOR-CON M) 20 MEQ tablet Take 1 tablet daily, alternating with 2 tablets every other day 04/18/22  Yes Gollan, Tollie Pizza, MD  albuterol (PROVENTIL) (2.5 MG/3ML) 0.083% nebulizer solution Take 3 mLs (2.5 mg total) by nebulization every 6 (six) hours as needed for wheezing or shortness of breath. Patient not taking: Reported on 06/08/2022 03/11/22   Darlin Priestly, MD  albuterol (VENTOLIN HFA) [phone] Base) MCG/ACT inhaler SMARTSIG:1-2 Puff(s) By Mouth Every 4-6 Hours PRN Patient not taking: Reported on 06/08/2022 03/03/22   [provider]  celecoxib (CELEBREX) 200 MG capsule SMARTSIG:1 Capsule(s) By Mouth 1-2 Times Daily Patient not taking: Reported on 04/18/2022 01/06/22   [provider]  ondansetron (ZOFRAN-ODT) 4 MG disintegrating tablet Take 4 mg by mouth every 8 (eight) hours as needed. Patient not taking: Reported on 06/08/2022 03/03/22   [provider]  tiZANidine (ZANAFLEX) 4 MG tablet SMARTSIG:1 Tablet(s) By Mouth 1-3 Times Daily Patient not taking: Reported on 04/18/2022 03/04/22   [provider]    Allergies as of 06/08/2022   (No Known Allergies)    Family History  Problem Relation Age of Onset   Hypertension Mother    Heart disease  Mother        CABG   Heart disease Father    Heart attack Father     Social History   Socioeconomic History   Marital status: Widowed    Spouse name: Not on file   Number of children: Not on file   Years of education: Not on file   Highest education level: Not on file  Occupational History   Not on file  Tobacco Use   Smoking status: Some Days    Packs/day: 1.00    Years: 30.00    Total pack years: 30.00    Types: Cigarettes   Smokeless tobacco: Never   Tobacco comments:    Smokes 5 cigarettes daily .  Trying to quit  Vaping Use   Vaping Use: Never  used  Substance and Sexual Activity   Alcohol use: Yes    Alcohol/week: 7.0 standard drinks of alcohol    Types: 7 Glasses of wine per week    Comment: one glass of wine daily after dinner   Drug use: Never   Sexual activity: Not Currently  Other Topics Concern   Not on file  Social History Narrative   Not on file   Social Determinants of Health   Financial Resource Strain: Not on file  Food Insecurity: Not on file  Transportation Needs: Not on file  Physical Activity: Not on file  Stress: Not on file  Social Connections: Not on file  Intimate Partner Violence: Not on file    Review of Systems: See HPI, otherwise negative ROS  Physical Exam: BP (!) 155/70   Pulse 72   Temp 97.7 F (36.5 C) (Temporal)   Resp 18   Ht 5' (1.524 m)   Wt 68.6 kg   SpO2 97%   BMI 29.53 kg/m  General:   Alert, cooperative in NAD Head:  Normocephalic and atraumatic. Respiratory:  Normal work of breathing. Cardiovascular:  RRR  Impression/Plan: Stacey Sandoval is here for cataract surgery.  Risks, benefits, limitations, and alternatives regarding cataract surgery have been reviewed with the patient.  Questions have been answered.  All parties agreeable.   Galen Manila, MD  06/21/2022, 10:52 AM

## 2022-06-21 NOTE — Anesthesia Postprocedure Evaluation (Signed)
Anesthesia Post Note  Patient: Stacey Sandoval  Procedure(s) Performed: CATARACT EXTRACTION PHACO AND INTRAOCULAR LENS PLACEMENT (IOC) RIGHT 9.09 00:51.1 (Right: Eye)     Patient location during evaluation: PACU Anesthesia Type: MAC Level of consciousness: awake and alert Pain management: pain level controlled Vital Signs Assessment: post-procedure vital signs reviewed and stable Respiratory status: spontaneous breathing, nonlabored ventilation, respiratory function stable and patient connected to nasal cannula oxygen Cardiovascular status: stable and blood pressure returned to baseline Postop Assessment: no apparent nausea or vomiting Anesthetic complications: no   No notable events documented.  Lane Eland A  Tryphena Perkovich

## 2022-06-21 NOTE — Transfer of Care (Signed)
Immediate Anesthesia Transfer of Care Note  Patient: Stacey Sandoval  Procedure(s) Performed: CATARACT EXTRACTION PHACO AND INTRAOCULAR LENS PLACEMENT (IOC) RIGHT 9.09 00:51.1 (Right: Eye)  Patient Location: PACU  Anesthesia Type: MAC  Level of Consciousness: awake, alert  and patient cooperative  Airway and Oxygen Therapy: Patient Spontanous Breathing and Patient connected to supplemental oxygen  Post-op Assessment: Post-op Vital signs reviewed, Patient's Cardiovascular Status Stable, Respiratory Function Stable, Patent Airway and No signs of Nausea or vomiting  Post-op Vital Signs: Reviewed and stable  Complications: No notable events documented.

## 2022-06-21 NOTE — Op Note (Signed)
PREOPERATIVE DIAGNOSIS:  Nuclear sclerotic cataract of the right eye.   POSTOPERATIVE DIAGNOSIS:  h25.11 cataract   OPERATIVE PROCEDURE:ORPROCALL@   SURGEON:  Galen Manila, MD.   ANESTHESIA:  Anesthesiologist: Heniser, Burman Foster, MD CRNA: Michaele Offer, CRNA  1.      Managed anesthesia care. 2.      0.56ml of Shugarcaine was instilled in the eye following the paracentesis.   COMPLICATIONS: Viscoelastic was used to raise the pupil margin.  A  Malyugin ring was placed as the pupil would not achieve sufficient pharmacologic dilation to undergo cataract extraction safely.( The ring was removed atraumatically following insertion of the IOL.)    TECHNIQUE:   Stop and chop   DESCRIPTION OF PROCEDURE:  The patient was examined and consented in the preoperative holding area where the aforementioned topical anesthesia was applied to the right eye and then brought back to the Operating Room where the right eye was prepped and draped in the usual sterile ophthalmic fashion and a lid speculum was placed. A paracentesis was created with the side port blade and the anterior chamber was filled with viscoelastic. A near clear corneal incision was performed with the steel keratome. A continuous curvilinear capsulorrhexis was performed with a cystotome followed by the capsulorrhexis forceps. Hydrodissection and hydrodelineation were carried out with BSS on a blunt cannula. The lens was removed in a stop and chop  technique and the remaining cortical material was removed with the irrigation-aspiration handpiece. The capsular bag was inflated with viscoelastic and the Technis [id number]  lens was placed in the capsular bag without complication. The remaining viscoelastic was removed from the eye with the irrigation-aspiration handpiece. The wounds were hydrated. The anterior chamber was flushed with BSS and the eye was inflated to physiologic pressure. 0.36ml of Vigamox was placed in the anterior chamber. The wounds were  found to be water tight. The eye was dressed with Combigan. The patient was given protective glasses to wear throughout the day and a shield with which to sleep tonight. The patient was also given drops with which to begin a drop regimen today and will follow-up with me in one day. Implant Name Type Inv. Item Serial No. Manufacturer Lot No. LRB No. Used Action  LENS IOL TECNIS EYHANCE 25.0 - [id number] Intraocular Lens LENS IOL TECNIS EYHANCE 25.0 [id number] [employer]  Right 1 Implanted   Procedure(s): CATARACT EXTRACTION PHACO AND INTRAOCULAR LENS PLACEMENT (IOC) RIGHT 9.09 00:51.1 (Right)  Electronically signed: Galen Manila 06/21/2022 11:35 AM

## 2022-06-21 NOTE — Anesthesia Preprocedure Evaluation (Signed)
Anesthesia Evaluation  Patient identified by MRN, date of birth, ID band Patient awake    Reviewed: Allergy & Precautions, NPO status , Patient's Chart, lab work & pertinent test results, reviewed documented beta blocker date and time   History of Anesthesia Complications Negative for: history of anesthetic complications  Airway Mallampati: III  TM Distance: <3 FB Neck ROM: Limited    Dental  (+) Upper Dentures, Lower Dentures   Pulmonary COPD,  oxygen dependent, Current Smoker and Patient abstained from smoking.,    breath sounds clear to auscultation       Cardiovascular Exercise Tolerance: Poor hypertension, (-) angina+CHF and + DOE (Chronic, stable)  + dysrhythmias (on Eliquis) Atrial Fibrillation  Rhythm:Regular Rate:Normal   HLD  LVOT obstruction  TTE (02/2022): LVEF 65-70% Mod asymmetric LVH, grade I diastolic dysfunction Mod LVOT obstruction, peak gradient 50 mmHg, 59 mmHg w/ Valsalva LA mildly dilated   Neuro/Psych    GI/Hepatic neg GERD  ,  Endo/Other    Renal/GU      Musculoskeletal   Abdominal   Peds  Hematology   Anesthesia Other Findings   Reproductive/Obstetrics                            Anesthesia Physical Anesthesia Plan  ASA: 4  Anesthesia Plan: MAC   Post-op Pain Management:    Induction: Intravenous  PONV Risk Score and Plan: 1 and TIVA, Midazolam and Treatment may vary due to age or medical condition  Airway Management Planned: Nasal Cannula  Additional Equipment:   Intra-op Plan:   Post-operative Plan:   Informed Consent: I have reviewed the patients History and Physical, chart, labs and discussed the procedure including the risks, benefits and alternatives for the proposed anesthesia with the patient or authorized representative who has indicated his/her understanding and acceptance.       Plan Discussed with: CRNA and  Anesthesiologist  Anesthesia Plan Comments:         Anesthesia Quick Evaluation

## 2022-06-22 ENCOUNTER — Encounter: Payer: Self-pay | Admitting: Ophthalmology

## 2022-06-23 ENCOUNTER — Ambulatory Visit: Payer: Medicare Other | Admitting: Pulmonary Disease

## 2022-06-23 ENCOUNTER — Encounter: Payer: Self-pay | Admitting: Pulmonary Disease

## 2022-06-23 VITALS — BP 128/80 | HR 78 | Temp 97.7°F | Ht 60.0 in | Wt 151.6 lb

## 2022-06-23 DIAGNOSIS — E049 Nontoxic goiter, unspecified: Secondary | ICD-10-CM | POA: Diagnosis not present

## 2022-06-23 DIAGNOSIS — R0602 Shortness of breath: Secondary | ICD-10-CM

## 2022-06-23 DIAGNOSIS — I422 Other hypertrophic cardiomyopathy: Secondary | ICD-10-CM | POA: Diagnosis not present

## 2022-06-23 DIAGNOSIS — F17210 Nicotine dependence, cigarettes, uncomplicated: Secondary | ICD-10-CM

## 2022-06-23 DIAGNOSIS — J449 Chronic obstructive pulmonary disease, unspecified: Secondary | ICD-10-CM

## 2022-06-23 MED ORDER — BREZTRI AEROSPHERE 160-9-4.8 MCG/ACT IN AERO: 2.0000 | INHALATION_SPRAY | Freq: Two times a day (BID) | RESPIRATORY_TRACT | 0 refills | Status: DC

## 2022-06-23 MED ORDER — BREZTRI AEROSPHERE 160-9-4.8 MCG/ACT IN AERO: 2.0000 | INHALATION_SPRAY | Freq: Two times a day (BID) | RESPIRATORY_TRACT | 11 refills | Status: DC

## 2022-06-23 NOTE — Progress Notes (Signed)
Subjective:    Patient ID: Stacey Sandoval, female    DOB: 01/23/1951, 71 y.o.   MRN: [id number] Patient Care Team: Practice, [facility] Family as PCP - General  Chief Complaint  Patient presents with   Follow-up    Hx of COPD--admission 02/2022 and discharged with 2-3L. C/o SOB with exertion, dry cough at times prod with clear to white sputum.    HPI The patient is a 71 year old smoker with a 30-pack-year history of smoking and history as noted below, who is referred for evaluation of shortness of breath and potential COPD.  She is kindly referred by Dr. Julien Nordmann.  The patient was admitted on 07 March 2022 through 16 March 2022 to [facility] for a COPD exacerbation.  She has been oxygen dependent since then.  She has been on 2 L/min via nasal cannula 24/7.  She wonders if she can come off of this.  She continues to have dyspnea on exertion, during exertion thus have to increase her oxygen to 3 L/min to tolerated.  She does note that oxygen does help her dyspnea significantly.  She is currently not on maintenance inhaler.  She is only on albuterol via nebulizer.  Notes that albuterol does help her but is not long-lasting.  She does not endorse any chest pain.  No lower extremity edema or calf tenderness.  Cough is mostly dry and at times productive of clear to white sputum.  Shortness of breath is mostly with exertion never with rest.  As noted albuterol does help.  Oxygen also helps.  She does not endorse any other symptomatology.   DATA 02/28/2022 echocardiogram: LVEF 65 to 70%.  Moderate asymmetric left ventricular hypertrophy of the basal septal segment.  Grade 1 DD, moderate LVOT obstruction with peak gradient of 50 mmHg at rest and 59 mmHg with Valsalva. 04/02/2022 CT chest: Diffuse coronary artery calcifications, mildly prominent mediastinal lymph nodes, enlargement of the thyroid gland with substernal extension, gland has numerous nodules and masses and narrows the trachea near the  thoracic inlet.  Previously noted apical density in the left apical lung resolved.  Scarring in the right middle lobe and lingula.  Calcified granuloma in the left lower lobe and left lung base.  Consistent with old granulomatous disease  Review of Systems A 10 point review of systems was performed and it is as noted above otherwise negative.  Past Medical History:  Diagnosis Date   Atrial fibrillation (HCC)    CHF (congestive heart failure) (HCC)    Complication of anesthesia    VERY slow to wake after hysterectomy   COPD (chronic obstructive pulmonary disease) (HCC)    Enlarged thyroid    has been referred to [facility]   Hyperlipidemia    Hypertension    Smokers' cough (HCC)    Tobacco use    Wears dentures    Full upper and lower   Past Surgical History:  Procedure Laterality Date   ABDOMINAL HYSTERECTOMY  1990   BREAST LUMPECTOMY Right 1974   CATARACT EXTRACTION W/PHACO Right 06/21/2022   Procedure: CATARACT EXTRACTION PHACO AND INTRAOCULAR LENS PLACEMENT (IOC) RIGHT 9.09 00:51.1;  Surgeon: Galen Manila, MD;  Location: [facility];  Service: Ophthalmology;  Laterality: Right;   CHOLECYSTECTOMY  2013   Patient Active Problem List   Diagnosis Date Noted   Acute on chronic diastolic CHF (congestive heart failure) (HCC) 03/16/2022   Hypertrophic cardiomegaly 03/16/2022   Hypokalemia 03/16/2022   Hypomagnesemia 03/14/2022   Atrial fibrillation with rapid  ventricular response (HCC) 03/11/2022   Rhinovirus infection 03/10/2022   Current every day smoker 03/10/2022   Obesity (BMI 30-39.9) 03/10/2022   Acute hypoxemic respiratory failure (HCC) 03/09/2022   Insomnia 03/08/2022   Essential hypertension 03/08/2022   Hyperlipidemia 03/08/2022   Hyponatremia 03/08/2022   Acute pulmonary edema (HCC) 03/08/2022   COPD exacerbation (HCC) 03/08/2022   Family History  Problem Relation Age of Onset   Hypertension Mother    Heart disease Mother        CABG   Heart disease  Father    Heart attack Father    Social History   Tobacco Use   Smoking status: Every Day    Packs/day: 1.00    Years: 30.00    Total pack years: 30.00    Types: Cigarettes   Smokeless tobacco: Never   Tobacco comments:    0.5PPD 06/23/2022  Substance Use Topics   Alcohol use: Yes    Alcohol/week: 7.0 standard drinks of alcohol    Types: 7 Glasses of wine per week    Comment: one glass of wine daily after dinner   No Known Allergies  Current Meds  Medication Sig   albuterol (PROVENTIL) (2.5 MG/3ML) 0.083% nebulizer solution Take 3 mLs (2.5 mg total) by nebulization every 6 (six) hours as needed for wheezing or shortness of breath.   atorvastatin (LIPITOR) 20 MG tablet Take 20 mg by mouth daily.   diltiazem (CARDIZEM CD) 180 MG 24 hr capsule Take 1 capsule (180 mg total) by mouth daily.   diltiazem (CARDIZEM) 30 MG tablet Take 1 tablet (30 mg total) by mouth as needed (atrial fibrillation).   famotidine (PEPCID) 40 MG tablet Take 40 mg by mouth 2 (two) times daily.   gabapentin (NEURONTIN) 300 MG capsule Take 300 mg every am & 600 in the pm   mirtazapine (REMERON) 45 MG tablet Take 45 mg by mouth at bedtime.   ondansetron (ZOFRAN-ODT) 4 MG disintegrating tablet Take 4 mg by mouth every 8 (eight) hours as needed.   OXYGEN Inhale into the lungs as needed. 2-3 Liters   potassium chloride SA (KLOR-CON M) 20 MEQ tablet Take 1 tablet daily, alternating with 2 tablets every other day   [DISCONTINUED] albuterol (VENTOLIN HFA) 108 (90 Base) MCG/ACT inhaler  (Patient not taking: Reported on 08/02/2022)   [DISCONTINUED] apixaban (ELIQUIS) 5 MG TABS tablet Take 1 tablet (5 mg total) by mouth 2 (two) times daily.   [DISCONTINUED] bisoprolol (ZEBETA) 5 MG tablet Take 1 tablet (5 mg total) by mouth 2 (two) times daily. (Patient taking differently: Take 5 mg by mouth daily.)   [DISCONTINUED] tiZANidine (ZANAFLEX) 4 MG tablet  (Patient not taking: Reported on 07/05/2022)   Immunization History   Administered Date(s) Administered   Influenza-Unspecified 09/20/2021   PFIZER(Purple Top)SARS-COV-2 Vaccination 02/14/2020, 02/29/2020   Pfizer Covid-19 Vaccine Bivalent Booster 31yrs & up 09/09/2020, 08/07/2021      Objective:   Physical Exam BP 128/80 (BP Location: Left Arm, Cuff Size: Normal)   Pulse 78   Temp 97.7 F (36.5 C) (Temporal)   Ht 5' (1.524 m)   Wt 151 lb 9.6 oz (68.8 kg)   SpO2 96%   BMI 29.61 kg/m   SpO2: 96 % O2 Device: Nasal cannula O2 Flow Rate (L/min): 2 L/min O2 Type: Continuous O2  GENERAL: Well-developed, well-nourished woman, no acute distress.  Comfortable on nasal cannula O2. HEAD: Normocephalic, atraumatic.  EYES: Pupils equal, round, reactive to light.  No scleral icterus.  MOUTH: Dentition  is intact.  Oral mucosa moist. NECK: Supple. + thyromegaly (goiter). Trachea midline. No JVD.  No adenopathy. PULMONARY: Good air entry bilaterally.  No adventitious sounds. CARDIOVASCULAR: S1 and S2. Regular rate and rhythm.  No rubs, murmurs or gallops heard. ABDOMEN: Benign. MUSCULOSKELETAL: No joint deformity, no clubbing, no edema.  NEUROLOGIC: No overt focal deficit, no gait disturbance, speech is fluent. SKIN: Intact,warm,dry. PSYCH: Mood and behavior normal.  Representative images from the CT scan performed 02 April 2022 showing massive enlargement of the thyroid impinging on the trachea:     Assessment & Plan:     ICD-10-CM   1. Shortness of breath  R06.02 Pulmonary Function Test [facility] Only   Multifactorial Cannot exclude effect of substernal goiter PFTs should help clarify    2. 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 Tobacco dependence due to cigarettes  F17.210    Patient counseled with regards to discontinuation of smoking Discussed proper use of nicotine replacement patches     Orders Placed This Encounter  Procedures   Pulmonary Function Test [facility] Only    Next available.    Standing Status:   Future    Number of Occurrences:   1    Standing Expiration Date:   06/24/2023    Order Specific Question:   Full PFT: includes the following: basic spirometry, spirometry pre & post bronchodilator, diffusion capacity (DLCO), lung volumes    Answer:   Full PFT   Meds ordered this encounter  Medications   Budeson-Glycopyrrol-Formoterol (BREZTRI AEROSPHERE) 160-9-4.8 MCG/ACT AERO    Sig: Inhale 2 puffs into the lungs in the morning and at bedtime.    Dispense:  5.9 g    Refill:  0    Order Specific Question:   Lot Number?    Answer:   [id number]    Order Specific Question:   Expiration Date?    Answer:   11/11/2024    Order Specific Question:   Manufacturer?    Answer:   [employer] [[id number]]    Order Specific Question:   Quantity    Answer:   1   Budeson-Glycopyrrol-Formoterol (BREZTRI AEROSPHERE) 160-9-4.8 MCG/ACT AERO    Sig: Inhale 2 puffs into the lungs in the morning and at bedtime.    Dispense:  10.7 g    Refill:  11   Will see the patient in follow-up in 2 to 3 months time she is to call sooner should any new problems arise.  Renold Don, MD Advanced Bronchoscopy PCCM [facility]    *This note was dictated using voice recognition software/Dragon.  Despite best efforts to proofread, errors can occur which can change the meaning. Any transcriptional errors that result from this process are unintentional and may not be fully corrected at the time of dictation.

## 2022-06-23 NOTE — Patient Instructions (Signed)
We are giving you a trial of an inhaler called Breztri that is 2 puffs twice a day.  You are also getting a spacer device that will help you get that inhaler medication deep into your lungs.  Make sure you rinse your mouth well after you use it.  We are scheduling you to have a breathing test this will tell us a lot of information about your lungs and also if the goiter that you have in your neck is affecting your main breathing tube.  Continue to work on quitting smoking.  We discussed the use of patches today.  Since you are only smoking a half a pack a day I would recommend the 14 mg size patch.  These can be obtained over-the-counter.  Recommend putting a patch on in the morning and taking it off at bedtime.  Follow the directions on the box for the patches.  Continue your oxygen for now.  We will reassess your need for oxygen when you come back to see [place].   We will see you in follow-up in 2 to 3 months time call sooner should any new problems arise.

## 2022-06-27 ENCOUNTER — Encounter: Payer: Self-pay | Admitting: Ophthalmology

## 2022-06-29 DIAGNOSIS — H2512 Age-related nuclear cataract, left eye: Secondary | ICD-10-CM | POA: Diagnosis not present

## 2022-06-30 NOTE — Discharge Instructions (Signed)

## 2022-07-05 ENCOUNTER — Ambulatory Visit: Payer: Medicare Other | Admitting: Anesthesiology

## 2022-07-05 ENCOUNTER — Ambulatory Visit
Admission: RE | Admit: 2022-07-05 | Discharge: 2022-07-05 | Disposition: A | Discharge status: Home / Self Care | Payer: Medicare Other | Attending: Ophthalmology | Admitting: Ophthalmology

## 2022-07-05 ENCOUNTER — Encounter
Admission: RE | Disposition: A | Discharge status: Home / Self Care | Payer: Self-pay | Source: Home / Self Care | Attending: Ophthalmology

## 2022-07-05 ENCOUNTER — Other Ambulatory Visit: Payer: Self-pay

## 2022-07-05 ENCOUNTER — Encounter: Payer: Self-pay | Admitting: Ophthalmology

## 2022-07-05 DIAGNOSIS — I110 Hypertensive heart disease with heart failure: Secondary | ICD-10-CM | POA: Diagnosis not present

## 2022-07-05 DIAGNOSIS — I4891 Unspecified atrial fibrillation: Secondary | ICD-10-CM | POA: Diagnosis not present

## 2022-07-05 DIAGNOSIS — Z87891 Personal history of nicotine dependence: Secondary | ICD-10-CM | POA: Diagnosis not present

## 2022-07-05 DIAGNOSIS — H2512 Age-related nuclear cataract, left eye: Secondary | ICD-10-CM | POA: Diagnosis not present

## 2022-07-05 DIAGNOSIS — J449 Chronic obstructive pulmonary disease, unspecified: Secondary | ICD-10-CM | POA: Insufficient documentation

## 2022-07-05 DIAGNOSIS — I509 Heart failure, unspecified: Secondary | ICD-10-CM | POA: Diagnosis not present

## 2022-07-05 DIAGNOSIS — Z7901 Long term (current) use of anticoagulants: Secondary | ICD-10-CM | POA: Insufficient documentation

## 2022-07-05 DIAGNOSIS — I499 Cardiac arrhythmia, unspecified: Secondary | ICD-10-CM | POA: Diagnosis not present

## 2022-07-05 DIAGNOSIS — Z9981 Dependence on supplemental oxygen: Secondary | ICD-10-CM | POA: Diagnosis not present

## 2022-07-05 DIAGNOSIS — F17210 Nicotine dependence, cigarettes, uncomplicated: Secondary | ICD-10-CM | POA: Diagnosis not present

## 2022-07-05 HISTORY — PX: CATARACT EXTRACTION W/PHACO: SHX586

## 2022-07-05 SURGERY — PHACOEMULSIFICATION, CATARACT, WITH IOL INSERTION
Anesthesia: Monitor Anesthesia Care | Site: Eye | Laterality: Left

## 2022-07-05 MED ORDER — ACETAMINOPHEN 325 MG PO TABS: 325.0000 mg | ORAL_TABLET | ORAL | Status: DC | PRN

## 2022-07-05 MED ORDER — SIGHTPATH DOSE#1 BSS IO SOLN
INTRAOCULAR | Status: DC | PRN
  Administered 2022-07-05: 15 mL

## 2022-07-05 MED ORDER — TETRACAINE HCL 0.5 % OP SOLN
1.0000 [drp] | OPHTHALMIC | Status: DC | PRN
  Administered 2022-07-05 (×3): 1 [drp] via OPHTHALMIC

## 2022-07-05 MED ORDER — MIDAZOLAM HCL 2 MG/2ML IJ SOLN
INTRAMUSCULAR | Status: DC | PRN
  Administered 2022-07-05 (×2): .5 mg via INTRAVENOUS

## 2022-07-05 MED ORDER — SIGHTPATH DOSE#1 NA CHONDROIT SULF-NA HYALURON 40-17 MG/ML IO SOLN
INTRAOCULAR | Status: DC | PRN
  Administered 2022-07-05: 1 mL via INTRAOCULAR

## 2022-07-05 MED ORDER — FENTANYL CITRATE (PF) 100 MCG/2ML IJ SOLN
INTRAMUSCULAR | Status: DC | PRN
  Administered 2022-07-05: 50 ug via INTRAVENOUS

## 2022-07-05 MED ORDER — SIGHTPATH DOSE#1 BSS IO SOLN
INTRAOCULAR | Status: DC | PRN
  Administered 2022-07-05: 1 mL via INTRAMUSCULAR

## 2022-07-05 MED ORDER — BRIMONIDINE TARTRATE-TIMOLOL 0.2-0.5 % OP SOLN
OPHTHALMIC | Status: DC | PRN
  Administered 2022-07-05: 1 [drp] via OPHTHALMIC

## 2022-07-05 MED ORDER — MOXIFLOXACIN HCL 0.5 % OP SOLN
OPHTHALMIC | Status: DC | PRN
  Administered 2022-07-05: 0.2 mL via OPHTHALMIC

## 2022-07-05 MED ORDER — ARMC OPHTHALMIC DILATING DROPS
1.0000 | OPHTHALMIC | Status: DC | PRN
  Administered 2022-07-05 (×3): 1 via OPHTHALMIC

## 2022-07-05 MED ORDER — SIGHTPATH DOSE#1 BSS IO SOLN
INTRAOCULAR | Status: DC | PRN
  Administered 2022-07-05: 51 mL via OPHTHALMIC

## 2022-07-05 MED ORDER — ACETAMINOPHEN 160 MG/5ML PO SOLN: 325.0000 mg | ORAL | Status: DC | PRN

## 2022-07-05 MED ORDER — ONDANSETRON HCL 4 MG/2ML IJ SOLN: 4.0000 mg | Freq: Once | INTRAMUSCULAR | Status: DC | PRN

## 2022-07-05 SURGICAL SUPPLY — 11 items
CATARACT SUITE SIGHTPATH (MISCELLANEOUS) ×2 IMPLANT
FEE CATARACT SUITE SIGHTPATH (MISCELLANEOUS) ×1 IMPLANT
GLOVE SURG ENC TEXT LTX SZ8 (GLOVE) ×2 IMPLANT
GLOVE SURG TRIUMPH 8.0 PF LTX (GLOVE) ×2 IMPLANT
LENS IOL TECNIS EYHANCE 25.5 (Intraocular Lens) ×1 IMPLANT
NDL FILTER BLUNT 18X1 1/2 (NEEDLE) ×1 IMPLANT
NEEDLE FILTER BLUNT 18X 1/2SAF (NEEDLE) ×1
NEEDLE FILTER BLUNT 18X1 1/2 (NEEDLE) ×1 IMPLANT
RING MALYGIN (MISCELLANEOUS) ×1 IMPLANT
SYR 3ML LL SCALE MARK (SYRINGE) ×2 IMPLANT
WATER STERILE IRR 250ML POUR (IV SOLUTION) ×2 IMPLANT

## 2022-07-05 NOTE — H&P (Signed)
[facility]   Primary Care Physician:  Practice, [facility] Ophthalmologist: Dr. Druscilla Brownie  Pre-Procedure History & Physical: HPI:  Stacey Sandoval is a 71 y.o. female here for cataract surgery.   Past Medical History:  Diagnosis Date   Atrial fibrillation (HCC)    CHF (congestive heart failure) (HCC)    Complication of anesthesia    VERY slow to wake after hysterectomy   COPD (chronic obstructive pulmonary disease) (HCC)    Enlarged thyroid    has been referred to [facility]   Hyperlipidemia    Hypertension    Smokers' cough (HCC)    Tobacco use    Wears dentures    Full upper and lower    Past Surgical History:  Procedure Laterality Date   ABDOMINAL HYSTERECTOMY  1990   BREAST LUMPECTOMY Right 1974   CATARACT EXTRACTION W/PHACO Right 06/21/2022   Procedure: CATARACT EXTRACTION PHACO AND INTRAOCULAR LENS PLACEMENT (IOC) RIGHT 9.09 00:51.1;  Surgeon: Galen Manila, MD;  Location: [facility];  Service: Ophthalmology;  Laterality: Right;   CHOLECYSTECTOMY  2013    Prior to Admission medications   Medication Sig Start Date End Date Taking? Authorizing Provider  albuterol (PROVENTIL) (2.5 MG/3ML) 0.083% nebulizer solution Take 3 mLs (2.5 mg total) by nebulization every 6 (six) hours as needed for wheezing or shortness of breath. 03/11/22  Yes Darlin Priestly, MD  albuterol (VENTOLIN HFA) [phone] Base) MCG/ACT inhaler  03/03/22  Yes [provider]  apixaban (ELIQUIS) 5 MG TABS tablet Take 1 tablet (5 mg total) by mouth 2 (two) times daily. 04/18/22  Yes Gollan, Tollie Pizza, MD  atorvastatin (LIPITOR) 20 MG tablet Take 20 mg by mouth daily. 02/23/22  Yes [provider]  bisoprolol (ZEBETA) 5 MG tablet Take 1 tablet (5 mg total) by mouth 2 (two) times daily. Patient taking differently: Take 5 mg by mouth daily. 06/03/22  Yes Gollan, Tollie Pizza, MD  Budeson-Glycopyrrol-Formoterol (BREZTRI AEROSPHERE) 160-9-4.8 MCG/ACT AERO Inhale 2 puffs into the lungs  in the morning and at bedtime. 06/23/22  Yes Salena Saner, MD  Budeson-Glycopyrrol-Formoterol (BREZTRI AEROSPHERE) 160-9-4.8 MCG/ACT AERO Inhale 2 puffs into the lungs in the morning and at bedtime. 06/23/22  Yes Salena Saner, MD  diltiazem (CARDIZEM CD) 180 MG 24 hr capsule Take 1 capsule (180 mg total) by mouth daily. 04/18/22  Yes Gollan, Tollie Pizza, MD  diltiazem (CARDIZEM) 30 MG tablet Take 1 tablet (30 mg total) by mouth as needed (atrial fibrillation). 04/18/22  Yes Antonieta Iba, MD  famotidine (PEPCID) 40 MG tablet Take 40 mg by mouth 2 (two) times daily. 02/14/22  Yes [provider]  furosemide (LASIX) 40 MG tablet Take 1 tablet (40 mg total) by mouth daily. 03/16/22 07/05/22 Yes Marrion Coy, MD  gabapentin (NEURONTIN) 300 MG capsule Take 600 mg by mouth 2 (two) times daily. 02/23/22  Yes [provider]  mirtazapine (REMERON) 45 MG tablet Take 45 mg by mouth at bedtime. 02/01/22  Yes [provider]  ondansetron (ZOFRAN-ODT) 4 MG disintegrating tablet Take 4 mg by mouth every 8 (eight) hours as needed. 03/03/22  Yes [provider]  OXYGEN Inhale into the lungs as needed.   Yes [provider]  potassium chloride SA (KLOR-CON M) 20 MEQ tablet Take 1 tablet daily, alternating with 2 tablets every other day 04/18/22  Yes Gollan, Tollie Pizza, MD  celecoxib (CELEBREX) 200 MG capsule SMARTSIG:1 Capsule(s) By Mouth 1-2 Times Daily Patient not taking: Reported on  06/23/2022 01/06/22   [provider]  tiZANidine (ZANAFLEX) 4 MG tablet  03/04/22   [provider]    Allergies as of 06/08/2022   (No Known Allergies)    Family History  Problem Relation Age of Onset   Hypertension Mother    Heart disease Mother        CABG   Heart disease Father    Heart attack Father     Social History   Socioeconomic History   Marital status: Widowed    Spouse name: Not on file   Number of children: Not on file   Years of education: Not  on file   Highest education level: Not on file  Occupational History   Not on file  Tobacco Use   Smoking status: Every Day    Packs/day: 1.00    Years: 30.00    Total pack years: 30.00    Types: Cigarettes   Smokeless tobacco: Never   Tobacco comments:    0.5PPD 06/23/2022  Vaping Use   Vaping Use: Never used  Substance and Sexual Activity   Alcohol use: Yes    Alcohol/week: 7.0 standard drinks of alcohol    Types: 7 Glasses of wine per week    Comment: one glass of wine daily after dinner   Drug use: Never   Sexual activity: Not Currently  Other Topics Concern   Not on file  Social History Narrative   Not on file   Social Determinants of Health   Financial Resource Strain: Not on file  Food Insecurity: Not on file  Transportation Needs: Not on file  Physical Activity: Not on file  Stress: Not on file  Social Connections: Not on file  Intimate Partner Violence: Not on file    Review of Systems: See HPI, otherwise negative ROS  Physical Exam: BP 132/67   Pulse 73   Temp 98.1 F (36.7 C) (Temporal)   Resp 18   Ht 5' (1.524 m)   Wt 67.6 kg   SpO2 96%   BMI 29.10 kg/m  General:   Alert, cooperative in NAD Head:  Normocephalic and atraumatic. Respiratory:  Normal work of breathing. Cardiovascular:  RRR  Impression/Plan: Stacey Sandoval is here for cataract surgery.  Risks, benefits, limitations, and alternatives regarding cataract surgery have been reviewed with the patient.  Questions have been answered.  All parties agreeable.   Galen Manila, MD  07/05/2022, 11:30 AM

## 2022-07-05 NOTE — Op Note (Signed)
PREOPERATIVE DIAGNOSIS:  Nuclear sclerotic cataract of the left eye.   POSTOPERATIVE DIAGNOSIS:  Nuclear sclerotic cataract of the left eye.   OPERATIVE PROCEDURE:ORPROCALL@   SURGEON:  Galen Manila, MD.   ANESTHESIA:  Anesthesiologist: Jola Babinski, MD CRNA: Jimmy Picket, CRNA  1.      Managed anesthesia care. 2.     0.66ml of Shugarcaine was instilled following the paracentesis   COMPLICATIONS: Viscoelastic was used to raise the pupil margin.  A  Malyugin ring was placed as the pupil would not achieve sufficient pharmacologic dilation to undergo cataract extraction safely.( The ring was removed atraumatically following insertion of the IOL.)    TECHNIQUE:   Stop and chop   DESCRIPTION OF PROCEDURE:  The patient was examined and consented in the preoperative holding area where the aforementioned topical anesthesia was applied to the left eye and then brought back to the Operating Room where the left eye was prepped and draped in the usual sterile ophthalmic fashion and a lid speculum was placed. A paracentesis was created with the side port blade and the anterior chamber was filled with viscoelastic. A near clear corneal incision was performed with the steel keratome. A continuous curvilinear capsulorrhexis was performed with a cystotome followed by the capsulorrhexis forceps. Hydrodissection and hydrodelineation were carried out with BSS on a blunt cannula. The lens was removed in a stop and chop  technique and the remaining cortical material was removed with the irrigation-aspiration handpiece. The capsular bag was inflated with viscoelastic and the Technis [id number] lens was placed in the capsular bag without complication. The remaining viscoelastic was removed from the eye with the irrigation-aspiration handpiece. The wounds were hydrated. The anterior chamber was flushed with BSS and the eye was inflated to physiologic pressure. 0.21ml Vigamox was placed in the anterior chamber. The  wounds were found to be water tight. The eye was dressed with Combigan. The patient was given protective glasses to wear throughout the day and a shield with which to sleep tonight. The patient was also given drops with which to begin a drop regimen today and will follow-up with me in one day. Implant Name Type Inv. Item Serial No. Manufacturer Lot No. LRB No. Used Action  LENS IOL TECNIS EYHANCE 25.5 - [id number] Intraocular Lens LENS IOL TECNIS EYHANCE 25.5 [id number] [employer]  Left 1 Implanted    Procedure(s): CATARACT EXTRACTION PHACO AND INTRAOCULAR LENS PLACEMENT (IOC) LEFT 7.05 00:45.9 (Left)  Electronically signed: Galen Manila 07/05/2022 12:02 PM

## 2022-07-05 NOTE — Transfer of Care (Signed)
Immediate Anesthesia Transfer of Care Note  Patient: Stacey Sandoval  Procedure(s) Performed: CATARACT EXTRACTION PHACO AND INTRAOCULAR LENS PLACEMENT (IOC) LEFT 7.05 00:45.9 (Left: Eye)  Patient Location: PACU  Anesthesia Type: MAC  Level of Consciousness: awake, alert  and patient cooperative  Airway and Oxygen Therapy: Patient Spontanous Breathing and Patient connected to supplemental oxygen  Post-op Assessment: Post-op Vital signs reviewed, Patient's Cardiovascular Status Stable, Respiratory Function Stable, Patent Airway and No signs of Nausea or vomiting  Post-op Vital Signs: Reviewed and stable  Complications: No notable events documented.

## 2022-07-05 NOTE — Anesthesia Preprocedure Evaluation (Signed)
Anesthesia Evaluation  Patient identified by MRN, date of birth, ID band Patient awake    Reviewed: Allergy & Precautions, NPO status   History of Anesthesia Complications Negative for: history of anesthetic complications  Airway Mallampati: III  TM Distance: <3 FB Neck ROM: Limited    Dental  (+) Upper Dentures, Lower Dentures   Pulmonary COPD,  oxygen dependent, Current Smoker and Patient abstained from smoking.,    breath sounds clear to auscultation       Cardiovascular Exercise Tolerance: Poor hypertension, +CHF and + DOE (Chronic, stable)  + dysrhythmias (on Eliquis) Atrial Fibrillation  Rhythm:Regular Rate:Normal   HLD  LVOT obstruction  TTE (02/2022): LVEF 65-70% Mod asymmetric LVH, grade I diastolic dysfunction Mod LVOT obstruction, peak gradient 50 mmHg, 59 mmHg w/ Valsalva LA mildly dilated   Neuro/Psych    GI/Hepatic   Endo/Other    Renal/GU      Musculoskeletal   Abdominal   Peds  Hematology   Anesthesia Other Findings   Reproductive/Obstetrics                             Anesthesia Physical  Anesthesia Plan  ASA: 4  Anesthesia Plan: MAC   Post-op Pain Management:    Induction: Intravenous  PONV Risk Score and Plan: 1 and TIVA, Midazolam and Treatment may vary due to age or medical condition  Airway Management Planned: Nasal Cannula  Additional Equipment:   Intra-op Plan:   Post-operative Plan:   Informed Consent: I have reviewed the patients History and Physical, chart, labs and discussed the procedure including the risks, benefits and alternatives for the proposed anesthesia with the patient or authorized representative who has indicated his/her understanding and acceptance.       Plan Discussed with: CRNA and Anesthesiologist  Anesthesia Plan Comments:         Anesthesia Quick Evaluation

## 2022-07-05 NOTE — Anesthesia Procedure Notes (Signed)
Procedure Name: MAC Date/Time: 07/05/2022 11:42 AM  Performed by: Mayme Genta, CRNAPre-anesthesia Checklist: Patient identified, Emergency Drugs available, Suction available, Timeout performed and Patient being monitored Patient Re-evaluated:Patient Re-evaluated prior to induction Oxygen Delivery Method: Nasal cannula Placement Confirmation: positive ETCO2

## 2022-07-05 NOTE — Anesthesia Postprocedure Evaluation (Signed)
Anesthesia Post Note  Patient: Stacey Sandoval  Procedure(s) Performed: CATARACT EXTRACTION PHACO AND INTRAOCULAR LENS PLACEMENT (IOC) LEFT 7.05 00:45.9 (Left: Eye)     Patient location during evaluation: PACU Anesthesia Type: MAC Level of consciousness: awake Pain management: pain level controlled Vital Signs Assessment: post-procedure vital signs reviewed and stable Respiratory status: respiratory function stable Cardiovascular status: stable Postop Assessment: no apparent nausea or vomiting Anesthetic complications: no   No notable events documented.  Veda Canning

## 2022-07-06 ENCOUNTER — Encounter: Payer: Self-pay | Admitting: Ophthalmology

## 2022-07-17 DIAGNOSIS — J449 Chronic obstructive pulmonary disease, unspecified: Secondary | ICD-10-CM | POA: Diagnosis not present

## 2022-07-17 DIAGNOSIS — J9601 Acute respiratory failure with hypoxia: Secondary | ICD-10-CM | POA: Diagnosis not present

## 2022-07-20 ENCOUNTER — Other Ambulatory Visit: Payer: Self-pay | Admitting: Cardiovascular Disease

## 2022-07-25 DIAGNOSIS — J441 Chronic obstructive pulmonary disease with (acute) exacerbation: Secondary | ICD-10-CM | POA: Diagnosis not present

## 2022-07-25 DIAGNOSIS — R222 Localized swelling, mass and lump, trunk: Secondary | ICD-10-CM | POA: Diagnosis not present

## 2022-07-25 DIAGNOSIS — R0602 Shortness of breath: Secondary | ICD-10-CM | POA: Diagnosis not present

## 2022-07-27 DIAGNOSIS — R7989 Other specified abnormal findings of blood chemistry: Secondary | ICD-10-CM | POA: Diagnosis not present

## 2022-07-27 DIAGNOSIS — J9611 Chronic respiratory failure with hypoxia: Secondary | ICD-10-CM | POA: Diagnosis not present

## 2022-07-27 DIAGNOSIS — Z79899 Other long term (current) drug therapy: Secondary | ICD-10-CM | POA: Diagnosis not present

## 2022-07-27 DIAGNOSIS — E7800 Pure hypercholesterolemia, unspecified: Secondary | ICD-10-CM | POA: Diagnosis not present

## 2022-07-27 DIAGNOSIS — J449 Chronic obstructive pulmonary disease, unspecified: Secondary | ICD-10-CM | POA: Diagnosis not present

## 2022-07-27 DIAGNOSIS — I480 Paroxysmal atrial fibrillation: Secondary | ICD-10-CM | POA: Diagnosis not present

## 2022-07-27 DIAGNOSIS — Z87891 Personal history of nicotine dependence: Secondary | ICD-10-CM | POA: Diagnosis not present

## 2022-07-27 DIAGNOSIS — I509 Heart failure, unspecified: Secondary | ICD-10-CM | POA: Diagnosis not present

## 2022-07-27 DIAGNOSIS — I700 Atherosclerosis of aorta: Secondary | ICD-10-CM | POA: Diagnosis not present

## 2022-07-27 DIAGNOSIS — I10 Essential (primary) hypertension: Secondary | ICD-10-CM | POA: Diagnosis not present

## 2022-07-27 DIAGNOSIS — E042 Nontoxic multinodular goiter: Secondary | ICD-10-CM | POA: Diagnosis not present

## 2022-08-01 NOTE — Progress Notes (Unsigned)
Cardiology Office Note  Date:  08/02/2022   ID:  [name], DOB 07/17/1951, MRN [id number]  PCP:  Practice, [facility]   Chief Complaint  Patient presents with   3 month follow up     Patient c/o shortness of breath. Medications reviewed by the patient verbally.     HPI:  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 Who presents for follow-up of her atrial fibrillation  Last seen in clinic by myself May 2023 In general reports she has been doing well with not much arrhythmia Episode of atrial fibrillation In the setting of COPD exacerbation, recurrence of A-fib April 01, 2022 in the ER  Labs from PMD completed last week We will request labs for our system  Episode of afib 1 week ago Took extra diltiazem Had palpitations Lasted 15-20 min  Denies any abdominal distention, no leg swelling Taking Lasix 40 daily with potassium 20 daily alternating with 40  EKG personally reviewed by myself on todays visit Normal sinus rhythm with rate 84 bpm no significant ST-T wave changes  Past medical history reviewed echocardiogram dated March 08, 2022  showed ejection fraction 65 to 70% with moderate asymmetric left ventricular hypertrophy of the basal-septal segment. Moderate LVOT obstruction  with peak gradient of 50 mm Hg at rest and 59 mm Hg with Valsalva.   March 27th was NSR, admitted to the hospital March 31st was in atrial fib, presumably from nebulizer treatment in the setting of COPD During her hospital course it was documented she converted back to normal sinus rhythm with rate controlling agents, not antiarrhythmics  Was at home when she developed acute shortness of  breath went back to the emergency room April 21st back to the ER, atrial fibrillation, had SOB In atrial fibrillation on today's EKG  It appears that the time of her discharge from the hospital her valsartan HCTZ was held.  We will update her medication list  Still smoking 5-6 a day On chronic home oxygen 2 L, has a portable generator  PMH:   has a past medical history of Atrial fibrillation (HCC), CHF (congestive heart failure) (HCC), Complication of anesthesia, COPD (chronic obstructive pulmonary disease) (HCC), Enlarged thyroid, Hyperlipidemia, Hypertension, Smokers' cough (HCC), Tobacco use, and Wears dentures.  PSH:    Past Surgical History:  Procedure Laterality Date   ABDOMINAL HYSTERECTOMY  1990   BREAST LUMPECTOMY Right 1974   CATARACT EXTRACTION W/PHACO Right 06/21/2022   Procedure: CATARACT EXTRACTION PHACO AND INTRAOCULAR LENS PLACEMENT (IOC) RIGHT 9.09 00:51.1;  Surgeon: Stacey Sandoval;  [facility];  Service: Ophthalmology;  Laterality: Right;   CATARACT EXTRACTION W/PHACO Left 07/05/2022   Procedure: CATARACT EXTRACTION PHACO AND INTRAOCULAR LENS PLACEMENT (IOC) LEFT 7.05 00:45.9;  Surgeon: Stacey Sandoval;  Location: [facility];  Service: Ophthalmology;  Laterality: Left;   CHOLECYSTECTOMY  2013    Current Outpatient Medications  Medication Sig Dispense Refill   albuterol (PROVENTIL) (2.5 MG/3ML) 0.083% nebulizer solution Take 3 mLs (2.5 mg total) by nebulization every 6 (six) hours as needed for wheezing or shortness of breath. 75 mL 2   apixaban (ELIQUIS) 5 MG TABS tablet Take 1 tablet (5 mg total) by mouth 2 (two) times daily. 180  tablet 3   atorvastatin (LIPITOR) 20 MG tablet Take 20 mg by mouth daily.     Biotin 10 MG CAPS      bisoprolol (ZEBETA) 5 MG tablet TAKE 1 TABLET BY MOUTH TWICE  DAILY 180 tablet 0   Budeson-Glycopyrrol-Formoterol (BREZTRI AEROSPHERE) 160-9-4.8 MCG/ACT AERO Inhale 2 puffs into the lungs in the morning  and at bedtime. 5.9 g 0   diltiazem (CARDIZEM CD) 180 MG 24 hr capsule Take 1 capsule (180 mg total) by mouth daily. 90 capsule 3   diltiazem (CARDIZEM) 30 MG tablet Take 1 tablet (30 mg total) by mouth as needed (atrial fibrillation). 30 tablet 3   escitalopram (LEXAPRO) 10 MG tablet Take 10 mg by mouth daily.     famotidine (PEPCID) 40 MG tablet Take 40 mg by mouth 2 (two) times daily.     furosemide (LASIX) 40 MG tablet Take 1 tablet (40 mg total) by mouth daily. 30 tablet 0   gabapentin (NEURONTIN) 300 MG capsule Take 300 mg every am & 600 in the pm     mirtazapine (REMERON) 45 MG tablet Take 45 mg by mouth at bedtime.     ondansetron (ZOFRAN-ODT) 4 MG disintegrating tablet Take 4 mg by mouth every 8 (eight) hours as needed.     OXYGEN Inhale into the lungs as needed. 2-3 Liters     potassium chloride SA (KLOR-CON M) 20 MEQ tablet Take 1 tablet daily, alternating with 2 tablets every other day 45 tablet 3   albuterol (VENTOLIN HFA) 108 (90 Base) MCG/ACT inhaler  (Patient not taking: Reported on 08/02/2022)     Budeson-Glycopyrrol-Formoterol (BREZTRI AEROSPHERE) 160-9-4.8 MCG/ACT AERO Inhale 2 puffs into the lungs in the morning and at bedtime. (Patient not taking: Reported on 08/02/2022) 10.7 g 11   celecoxib (CELEBREX) 200 MG capsule SMARTSIG:1 Capsule(s) By Mouth 1-2 Times Daily (Patient not taking: Reported on 06/23/2022)     tiZANidine (ZANAFLEX) 4 MG tablet  (Patient not taking: Reported on 07/05/2022)     No current facility-administered medications for this visit.    Allergies:   Patient has no known allergies.   Social History:  The patient  reports that she has been smoking cigarettes. She has a 30.00 pack-year smoking history. She has never used smokeless tobacco. She reports current alcohol use of about 7.0 standard drinks of alcohol per week. She reports that she does not use drugs.   Family History:   family history includes Heart attack in her father; Heart disease in her father  and mother; Hypertension in her mother.    Review of Systems: Review of Systems  Constitutional: Negative.   HENT: Negative.    Respiratory:  Positive for shortness of breath.   Cardiovascular: Negative.   Gastrointestinal: Negative.   Musculoskeletal: Negative.   Neurological: Negative.   Psychiatric/Behavioral: Negative.    All other systems reviewed and are negative.    PHYSICAL EXAM: VS:  BP 110/60 (BP Location: Left Arm, Patient Position: Sitting, Cuff Size: Normal)   Pulse 84   Ht 5' (1.524 m)   Wt 148 lb 4 oz (67.2 kg)   SpO2 98% Comment: 2 Liters  BMI 28.95 kg/m  , BMI Body mass index is 28.95 kg/m. Constitutional:  oriented to person, place, and time. No distress.  On cannula oxygen HENT:  Head: Grossly normal Eyes:  no discharge. No scleral icterus.  Neck: No JVD, no carotid bruits  Cardiovascular: Regular rate and rhythm, no murmurs appreciated Pulmonary/Chest: Mildly decreased breath sounds  throughout, scattered Rales Abdominal: Soft.  no distension.  no tenderness.  Musculoskeletal: Normal range of motion Neurological:  normal muscle tone. Coordination normal. No atrophy Skin: Skin warm and dry Psychiatric: normal affect, pleasant  Recent Labs: 03/07/2022: TSH 0.208 03/16/2022: Magnesium 1.7 04/01/2022: ALT 18; B Natriuretic Peptide 133.7; BUN 11; Creatinine, Ser 0.66; Hemoglobin 12.7; Platelets 250; Potassium 3.5; Sodium 139    Lipid Panel No results found for: "CHOL", "HDL", "LDLCALC", "TRIG"    Wt Readings from Last 3 Encounters:  08/02/22 148 lb 4 oz (67.2 kg)  07/05/22 149 lb (67.6 kg)  06/23/22 151 lb 9.6 oz (68.8 kg)     ASSESSMENT AND PLAN:  Problem List Items Addressed This Visit       Cardiology Problems   Essential hypertension   Relevant Orders   EKG 12-Lead   EKG 12-Lead   Atrial fibrillation with rapid ventricular response (HCC) - Primary   Relevant Orders   EKG 12-Lead   EKG 12-Lead   Acute on chronic diastolic CHF  (congestive heart failure) (HCC)   Relevant Orders   EKG 12-Lead   EKG 12-Lead   Hypertrophic cardiomegaly   Hyperlipidemia   Other Visit Diagnoses     Chronic obstructive pulmonary disease, unspecified COPD type (HCC)         Paroxysmal atrial fibrillation In the setting of COPD exacerbation, recurrence of A-fib April 01, 2022 in the ER 1 recent episode of tachycardia lasting 15 to 20 minutes, seem to resolved without major intervention, took extra diltiazem 30 -Recommend she call if she has more frequent episodes of tachypalpitations concerning for atrial fibrillation Suggest she stay on bisoprolol 5 mg twice daily with diltiazem 180 daily Eliquis 5 twice daily  COPD with recent exacerbation Smoking cessation recommended on chronic oxygen On inhalers, followed by pulmonary  Hypertrophic cardiomyopathy Noted presumably in the setting of atrial fibrillation No significant murmur on exam Asymptomatic, continue beta-blocker, calcium channel blocker Continue rhythm control    Total encounter time more than 30 minutes  Greater than 50% was spent in counseling and coordination of care with the patient    Signed, Dossie Arbour, M.D., Ph.D. [facility] [place], [place] [phone]

## 2022-08-02 ENCOUNTER — Ambulatory Visit: Payer: Medicare Other | Admitting: Cardiovascular Disease

## 2022-08-02 ENCOUNTER — Encounter: Payer: Self-pay | Admitting: Cardiovascular Disease

## 2022-08-02 VITALS — BP 110/60 | HR 84 | Ht 60.0 in | Wt 148.2 lb

## 2022-08-02 DIAGNOSIS — I5033 Acute on chronic diastolic (congestive) heart failure: Secondary | ICD-10-CM | POA: Diagnosis not present

## 2022-08-02 DIAGNOSIS — E782 Mixed hyperlipidemia: Secondary | ICD-10-CM

## 2022-08-02 DIAGNOSIS — I517 Cardiomegaly: Secondary | ICD-10-CM

## 2022-08-02 DIAGNOSIS — I10 Essential (primary) hypertension: Secondary | ICD-10-CM | POA: Diagnosis not present

## 2022-08-02 DIAGNOSIS — J449 Chronic obstructive pulmonary disease, unspecified: Secondary | ICD-10-CM | POA: Diagnosis not present

## 2022-08-02 DIAGNOSIS — I4891 Unspecified atrial fibrillation: Secondary | ICD-10-CM

## 2022-08-02 NOTE — Patient Instructions (Addendum)
Labs from PMD  Medication Instructions:  No changes  If you decide you would like to switch from Eliquis to:  1) Pradaxa 150 mg (twice a day blood thinner)- roughly $77 with a [facility] coupon at [facility] [employer]  Or   2) Xarelto 20 mg ( once a day blood thinner)- check with [facility] [employer] to see if you will qualify for the $85 copay  Just let us know what you decide and we can help facilitate this switch for you  If you need a refill on your cardiac medications before your next appointment, please call your pharmacy.    Lab work: No new labs needed   Testing/Procedures: No new testing needed   Follow-Up: At [facility], you and your health needs are our priority.  As part of our continuing mission to provide you with exceptional heart care, we have created designated Provider Care Teams.  These Care Teams include your primary Cardiologist (physician) and Advanced Practice Providers (APPs -  Physician Assistants and Nurse Practitioners) who all work together to provide you with the care you need, when you need it.  You will need a follow up appointment in 6 months  Providers on your designated Care Team:   Nicolasa Ducking, NP Eula Listen, PA-C Cadence Fransico Michael, [place]  COVID-19 Vaccine Information can be found at: [email or web address] For questions related to vaccine distribution or appointments, please email [email or web address] or call [phone].

## 2022-08-04 ENCOUNTER — Telehealth: Payer: Self-pay | Admitting: Cardiovascular Disease

## 2022-08-04 NOTE — Telephone Encounter (Signed)
Pt c/o medication issue:  1. Name of Medication:  apixaban (ELIQUIS) 5 MG TABS tablet 2. How are you currently taking this medication (dosage and times per day)? 1 tablet twice a day  3. Are you having a reaction (difficulty breathing--STAT)? no  4. What is your medication issue? Patient's daughter states the eliquis company is faxing a patient assistance form for Dr. Mariah Milling to fill out. She would like a call back when it is filled out.

## 2022-08-08 MED ORDER — APIXABAN 5 MG PO TABS: 5.0000 mg | ORAL_TABLET | Freq: Two times a day (BID) | ORAL | 3 refills | Status: DC

## 2022-08-08 NOTE — Telephone Encounter (Signed)
Spoke with pt's daughter Cala Bradford who states pt had eliquis pt assistance application sent from the hospital. [employer] notified pt that pt assistance was not approved d/t MD portion was not filled out.   Cala Bradford confirms that pt has already sent in all requested documentation including pharmacy expense report and proof of income, as well as signed pt portion of application. Cala Bradford confirms all our office needs to do is send in signed MD portion.   MD portion completed and Rx printed. Left on Dr. Windell Hummingbird desk for signature.   Forwarding to triage to fax completed forms to [employer] @ [phone]

## 2022-08-12 NOTE — Telephone Encounter (Signed)
Completed physician portion of the application for PA and RX for eliquis faxed to [employer] at [phone].  - demographics, medication list, insurance cards also attached.  Fax confirmation received.  Completed application placed in file cabinet.

## 2022-08-17 DIAGNOSIS — J449 Chronic obstructive pulmonary disease, unspecified: Secondary | ICD-10-CM | POA: Diagnosis not present

## 2022-08-17 DIAGNOSIS — J9601 Acute respiratory failure with hypoxia: Secondary | ICD-10-CM | POA: Diagnosis not present

## 2022-08-19 NOTE — Telephone Encounter (Signed)
Fax received from [employer] stating the patient did not qualify for Eliquis assistance due to: Documentation of 3% out of pocket prescription expenses, based on household adjusted gross income, not met.  I called [employer] to see what the patient needed to meet her out of pocket. Per rep- she needs to meet $526.97 out of pocket.  I advised the rep that that sounds like a lot to meet out of pocket this time of the year and asked if they received a copy of the patient's out of pocket expense report when the family faxed the patient portion of the application. Per the [employer] rep, no expense report was received.   I called the patient's daughter, [name] and advised her of the above.  She confirms she was not aware an expense report was needed. I have asked her to please obtain this for the patient and she can either fax to [employer] directly or we can fax this for her.  [name] voices understanding and is agreeable.  She will bring this to the office if we need to fax for them.   She was very appreciative of the call.

## 2022-08-22 NOTE — Telephone Encounter (Signed)
Patient dropped off expense report placed in box.

## 2022-08-23 NOTE — Telephone Encounter (Signed)
Faxed updated expense reports from [facility] to [employer] at [phone]. Fax confirmation received.

## 2022-08-24 ENCOUNTER — Telehealth: Payer: Self-pay | Admitting: Cardiovascular Disease

## 2022-08-24 NOTE — Telephone Encounter (Signed)
Patient calling the office for samples of medication:   1.  What medication and dosage are you requesting samples for? apixaban (ELIQUIS) 5 MG TABS tablet  2.  Are you currently out of this medication? No, runs out Friday

## 2022-08-24 NOTE — Telephone Encounter (Signed)
I called and spoke with Stacey Sandoval (ok per [facility]). I advised her that I have pulled a weeks worth of Eliquis samples for the patient that they may pick up in the office.   Stacey Sandoval was also advised I that faxed the provided expense reports to [employer] and we are awaiting any further determination for the patient.   Stacey Sandoval voices understanding and is agreeable.  Samples Given: Eliquis 5 mg Lot: [id number] Exp: 03/2024 # 1 box

## 2022-08-30 ENCOUNTER — Ambulatory Visit: Payer: Medicare Other | Attending: Pulmonary Disease

## 2022-08-30 DIAGNOSIS — R0602 Shortness of breath: Secondary | ICD-10-CM | POA: Diagnosis not present

## 2022-08-30 LAB — PULMONARY FUNCTION TEST ARMC ONLY
DL/VA % pred: 111 %
DL/VA: 4.69 ml/min/mmHg/L
DLCO unc % pred: 82 %
DLCO unc: 14.35 ml/min/mmHg
FEF 25-75 Post: 1.04 L/s
FEF 25-75 Pre: 0.68 L/s
FEF2575-%Change-Post: 52 %
FEF2575-%Pred-Post: 65 %
FEF2575-%Pred-Pre: 42 %
FEV1-%Change-Post: 11 %
FEV1-%Pred-Post: 54 %
FEV1-%Pred-Pre: 48 %
FEV1-Post: 1.03 L
FEV1-Pre: 0.93 L
FEV1FVC-%Change-Post: 0 %
FEV1FVC-%Pred-Pre: 100 %
FEV6-%Change-Post: 11 %
FEV6-%Pred-Post: 57 %
FEV6-%Pred-Pre: 51 %
FEV6-Post: 1.38 L
FEV6-Pre: 1.23 L
FEV6FVC-%Pred-Post: 105 %
FEV6FVC-%Pred-Pre: 105 %
FVC-%Change-Post: 11 %
FVC-%Pred-Post: 54 %
FVC-%Pred-Pre: 48 %
FVC-Post: 1.38 L
FVC-Pre: 1.23 L
Post FEV1/FVC ratio: 75 %
Post FEV6/FVC ratio: 100 %
Pre FEV1/FVC ratio: 75 %
Pre FEV6/FVC Ratio: 100 %
RV % pred: 114 %
RV: 2.41 L
TLC % pred: 84 %
TLC: 3.87 L

## 2022-08-30 MED ORDER — ALBUTEROL SULFATE (2.5 MG/3ML) 0.083% IN NEBU
2.5000 mg | INHALATION_SOLUTION | Freq: Once | RESPIRATORY_TRACT | Status: AC
  Administered 2022-08-30: 2.5 mg via RESPIRATORY_TRACT
  Filled 2022-08-30: qty 3

## 2022-08-31 IMAGING — DX DG CHEST 1V PORT
1 series · 1 of 1 positions shown · non-contrast
Comparison: Chest CT from yesterday

CLINICAL DATA: Shortness of breath

EXAM:
PORTABLE CHEST 1 VIEW

[chest ap]
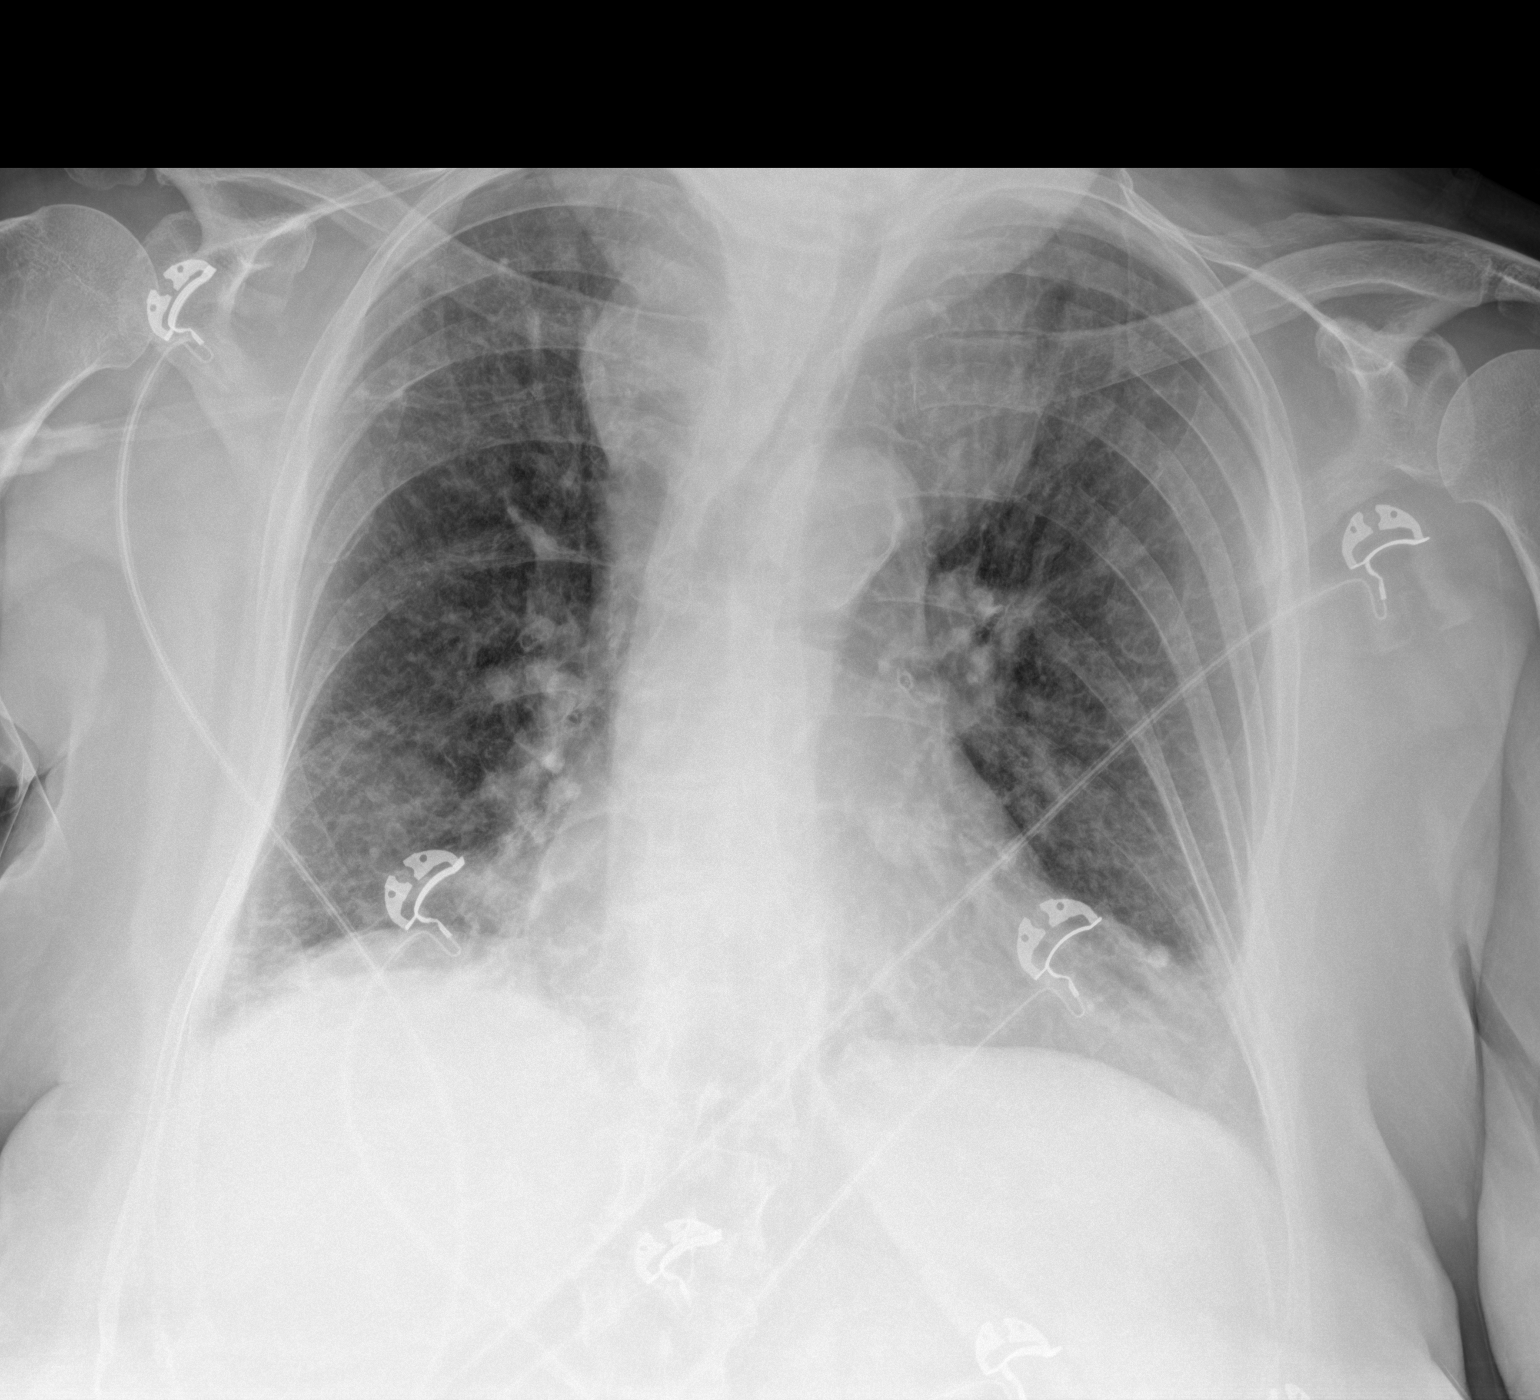

[1 of 1 positions shown; findings below may reference images not displayed]

FINDINGS: Normal heart size. Upper mediastinal widening from goiter.
Indistinct density at the bases is not progressed radiographically
and correlates with scar-like appearance by CT, greatest at the
collapsed right middle lobe. There is no edema, acute consolidation,
effusion, or pneumothorax.
IMPRESSION: Stable from prior radiograph. Lower lung scarring and goiter with
intrathoracic extension.

## 2022-08-31 NOTE — Progress Notes (Signed)
Spoke with Stacey Sandoval and notified of results per Dr.Gonzalez. Stacey Sandoval verbalized understanding and denied any questions. 

## 2022-09-01 ENCOUNTER — Ambulatory Visit: Payer: Medicare Other | Admitting: Pulmonary Disease

## 2022-09-01 ENCOUNTER — Telehealth: Payer: Self-pay | Admitting: Cardiovascular Disease

## 2022-09-01 ENCOUNTER — Encounter: Payer: Self-pay | Admitting: Pulmonary Disease

## 2022-09-01 VITALS — BP 124/78 | HR 85 | Temp 97.8°F | Ht 61.0 in | Wt 152.0 lb

## 2022-09-01 DIAGNOSIS — J9611 Chronic respiratory failure with hypoxia: Secondary | ICD-10-CM

## 2022-09-01 DIAGNOSIS — J449 Chronic obstructive pulmonary disease, unspecified: Secondary | ICD-10-CM | POA: Diagnosis not present

## 2022-09-01 DIAGNOSIS — E049 Nontoxic goiter, unspecified: Secondary | ICD-10-CM

## 2022-09-01 DIAGNOSIS — I422 Other hypertrophic cardiomyopathy: Secondary | ICD-10-CM

## 2022-09-01 DIAGNOSIS — F17210 Nicotine dependence, cigarettes, uncomplicated: Secondary | ICD-10-CM | POA: Diagnosis not present

## 2022-09-01 MED ORDER — BREZTRI AEROSPHERE 160-9-4.8 MCG/ACT IN AERO: 2.0000 | INHALATION_SPRAY | Freq: Two times a day (BID) | RESPIRATORY_TRACT | 0 refills | Status: AC

## 2022-09-01 NOTE — Telephone Encounter (Signed)
I called [employer] and confirmed with a rep that they did receive the updated exspence reports from the pharmacy that was faxed on 08/23/22. Per the rep, with the documented out of pocket paid, the patient should qualify for assistance. She inquired if the patient was currently out of medication. I advised that she stopped by our office today for more samples. Per [employer] rep, she will expedite the patient's application and this should turn around in 3-5 business days where a rep will reach out to the patient to confirm approval and her mailing address.  I have contacted the patient's daughter, [name] and notified her of the above. I have also notified her that samples have been pulled for her to pick up at our front desk.   [name] voices understanding and is agreeable. She was appreciative of the call back.

## 2022-09-01 NOTE — Telephone Encounter (Signed)
I will place samples of Eliquis upfront for pick up until we are able to check on status of her patient assistance.  Eliquis 5 mg tablet  Lot :[id number] Exp: 04/2024

## 2022-09-01 NOTE — Progress Notes (Signed)
Subjective:    Patient ID: Stacey Sandoval, female    DOB: 09-Mar-1951, 71 y.o.   MRN: [id number] Patient Care Team: Practice, [facility] as PCP - General  Chief Complaint  Patient presents with   Follow-up    Occ SOB with exertion.    HPI The patient is a 71 year old smoker with a 30-pack-year history of smoking and history as noted below, who is follows for shortness of breath and COPD.  Recall that the patient was admitted on 07 March 2022 through 16 March 2022 to [facility] for a COPD exacerbation.  She has been oxygen dependent since then.  She has been on 2 L/min via nasal cannula 24/7.  She wonders if she can come off of this.  She does note that oxygen does help her dyspnea significantly.  Her initial visit, she was given a trial of Breztri 2 puffs twice a day and was instructed to continue as needed albuterol.  Notes that the Judithann Sauger has helped her significantly with her dyspnea on exertion.  She does not endorse any chest pain.  No lower extremity edema or calf tenderness.  Cough is mostly dry and at times productive of clear to white sputum.  Shortness of breath is mostly with exertion never with rest.     She does not endorse any other symptomatology.  DATA 02/28/2022 echocardiogram: LVEF 65 to 70%.  Moderate asymmetric left ventricular hypertrophy of the basal septal segment.  Grade 1 DD, moderate LVOT obstruction with peak gradient of 50 mmHg at rest and 59 mmHg with Valsalva. 04/02/2022 CT chest: Diffuse coronary artery calcifications, mildly prominent mediastinal lymph nodes, enlargement of the thyroid gland with substernal extension, gland has numerous nodules and masses and narrows the trachea near the thoracic inlet.  Previously noted apical density in the left apical lung resolved.  Scarring in the right middle lobe and lingula.  Calcified granuloma in the left lower lobe and left lung base.  Consistent with old granulomatous disease. 08/30/2022 PFTs: Combined  restrictive/obstructive physiology.  FEV1 0.93 L or 48% predicted, FVC 1.23 L or 48% predicted, FEV1/FVC 75%, there is significant bronchodilator response, there is no overt reduction in lung volumes with mild air trapping.  Diffusion capacity mildly reduced to low normal.  Review of Systems A 10 point review of systems was performed and it is as noted above otherwise negative.  Patient Active Problem List   Diagnosis Date Noted   Acute on chronic diastolic CHF (congestive heart failure) ([place]) 03/16/2022   Hypertrophic cardiomegaly 03/16/2022   Hypokalemia 03/16/2022   Hypomagnesemia 03/14/2022   Atrial fibrillation with rapid ventricular response ([place]) 03/11/2022   Rhinovirus infection 03/10/2022   Current every day smoker 03/10/2022   Obesity (BMI 30-39.9) 03/10/2022   Acute hypoxemic respiratory failure ([place]) 03/09/2022   Insomnia 03/08/2022   Essential hypertension 03/08/2022   Hyperlipidemia 03/08/2022   Hyponatremia 03/08/2022   Acute pulmonary edema ([place]) 03/08/2022   COPD exacerbation ([place]) 03/08/2022   Social History   Tobacco Use   Smoking status: Every Day    Packs/day: 1.00    Years: 30.00    Total pack years: 30.00    Types: Cigarettes   Smokeless tobacco: Never   Tobacco comments:    0.5PPD 06/23/2022  Substance Use Topics   Alcohol use: Yes    Alcohol/week: 7.0 standard drinks of alcohol    Types: 7 Glasses of wine per week    Comment: one glass of wine daily after dinner  No Known Allergies  Current Meds  Medication Sig   albuterol (PROVENTIL) (2.5 MG/3ML) 0.083% nebulizer solution Take 3 mLs (2.5 mg total) by nebulization every 6 (six) hours as needed for wheezing or shortness of breath.   apixaban (ELIQUIS) 5 MG TABS tablet Take 1 tablet (5 mg total) by mouth 2 (two) times daily.   atorvastatin (LIPITOR) 20 MG tablet Take 20 mg by mouth daily.   Biotin 10 MG CAPS    bisoprolol (ZEBETA) 5 MG tablet TAKE 1 TABLET BY MOUTH TWICE  DAILY    Budeson-Glycopyrrol-Formoterol (BREZTRI AEROSPHERE) 160-9-4.8 MCG/ACT AERO Inhale 2 puffs into the lungs in the morning and at bedtime.   Budeson-Glycopyrrol-Formoterol (BREZTRI AEROSPHERE) 160-9-4.8 MCG/ACT AERO Inhale 2 puffs into the lungs in the morning and at bedtime.   diltiazem (CARDIZEM CD) 180 MG 24 hr capsule Take 1 capsule (180 mg total) by mouth daily.   diltiazem (CARDIZEM) 30 MG tablet Take 1 tablet (30 mg total) by mouth as needed (atrial fibrillation).   famotidine (PEPCID) 40 MG tablet Take 40 mg by mouth 2 (two) times daily.   gabapentin (NEURONTIN) 300 MG capsule Take 300 mg every am & 600 in the pm   mirtazapine (REMERON) 45 MG tablet Take 45 mg by mouth at bedtime.   ondansetron (ZOFRAN-ODT) 4 MG disintegrating tablet Take 4 mg by mouth every 8 (eight) hours as needed.   OXYGEN Inhale into the lungs as needed. 2-3 Liters   potassium chloride SA (KLOR-CON M) 20 MEQ tablet Take 1 tablet daily, alternating with 2 tablets every other day   [DISCONTINUED] escitalopram (LEXAPRO) 10 MG tablet Take 10 mg by mouth daily.   Immunization History  Administered Date(s) Administered   Influenza-Unspecified 09/20/2021   PFIZER(Purple Top)SARS-COV-2 Vaccination 02/14/2020, 02/29/2020   Pfizer Covid-19 Vaccine Bivalent Booster 49yrs & up 09/09/2020, 08/07/2021       Objective:   Physical Exam BP 124/78 (BP Location: Left Arm, Cuff Size: Normal)   Pulse 85   Temp 97.8 F (36.6 C) (Temporal)   Ht 5\' 1"  (1.549 m)   Wt 152 lb (68.9 kg)   SpO2 96%   BMI 28.72 kg/m   SpO2: 96 % O2 Device: None (Room air)  GENERAL: Well-developed, well-nourished woman, no acute distress.  Comfortable on nasal cannula O2. HEAD: Normocephalic, atraumatic.  EYES: Pupils equal, round, reactive to light.  No scleral icterus.  MOUTH: Dentition is intact.  Oral mucosa moist. NECK: Supple. + thyromegaly (goiter). Trachea midline. No JVD.  No adenopathy. PULMONARY: Good air entry bilaterally.  No  adventitious sounds. CARDIOVASCULAR: S1 and S2. Regular rate and rhythm.  No rubs, murmurs or gallops heard. ABDOMEN: Benign. MUSCULOSKELETAL: No joint deformity, no clubbing, no edema.  NEUROLOGIC: No overt focal deficit, no gait disturbance, speech is fluent. SKIN: Intact,warm,dry. PSYCH: Mood and behavior normal.   Ambulatory oximetry was performed today: At rest on room air oxygen saturation was 94%, heart rate 86 bpm, with less than 250 feet ambulation heart rate up to 102 bpm and oxygen saturation at 87%.  Patient required 2 L/min of oxygen continuously to maintain oxygen saturations above 90% consistently.     Assessment & Plan:     ICD-10-CM   1. Stage 2 moderate COPD by GOLD classification (HCC)  J44.9    Continue Breztri 2 puffs twice a day Continue as needed albuterol    2. 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 Hypertrophic cardiomyopathy (HCC)  I42.2    This issue adds complexity to her management Adding to her issues with this dyspnea    5. Tobacco dependence due to cigarettes  F17.210    As regards to discontinuation of smoking Total counseling time 3 to 5 minutes     Meds ordered this encounter  Medications   Budeson-Glycopyrrol-Formoterol (BREZTRI AEROSPHERE) 160-9-4.8 MCG/ACT AERO    Sig: Inhale 2 puffs into the lungs in the morning and at bedtime.    Dispense:  5.9 g    Refill:  0    Order Specific Question:   Lot Number?    Answer:   [id number]    Order Specific Question:   Expiration Date?    Answer:   12/12/2024    Order Specific Question:   Manufacturer?    Answer:   [employer] [[id number]]    Order Specific Question:   Quantity    Answer:   2   Patient overall seems to be requiring less oxygen than previous.  She is to continue using oxygen at 2  L/min with walking or exerting herself.  May be off oxygen when sitting quietly.  Should wear oxygen at nighttime.  Continue Breztri as she is doing.  Will see her in follow-up in 3 months time she is to contact us prior to that time should any new difficulties arise.  Renold Don, MD Advanced Bronchoscopy PCCM [facility]    *This note was dictated using voice recognition software/Dragon.  Despite best efforts to proofread, errors can occur which can change the meaning. Any transcriptional errors that result from this process are unintentional and may not be fully corrected at the time of dictation.

## 2022-09-01 NOTE — Telephone Encounter (Signed)
Patient daughter in lobby - patient here for pulmonary appt Would like to know if they can pick up samples before leaving Please advise

## 2022-09-01 NOTE — Patient Instructions (Signed)
You will need to continue using your oxygen at 2 L/min when you are walking or exerting yourself.  When you are sitting quietly chest reading or watching TV, you may be off the oxygen.  You do need to wear your oxygen at nighttime.  Continue Breztri at 2 puffs twice a day.  We will see you in follow-up in 3 months time call sooner should any new problems arise.

## 2022-09-01 NOTE — Telephone Encounter (Signed)
Patient's daughter is calling to check on status of the patient assistance application for Eliquis.  She said her mother is going to run out the samples she was given soon.

## 2022-09-01 NOTE — Telephone Encounter (Signed)
2 boxes

## 2022-09-01 NOTE — Telephone Encounter (Signed)
Please see note below. 

## 2022-09-02 NOTE — Telephone Encounter (Signed)
Received a letter stating [employer] patient assistance has approved the Eliquis free of charge from 09/01/2022 through 12/11/2022. Patient has been notified of the approval.

## 2022-09-07 ENCOUNTER — Telehealth: Payer: Self-pay

## 2022-09-07 NOTE — Telephone Encounter (Signed)
Patient dropped off patient assistance form for [facility].  Application has been placed in Dr. Domingo Dimes folder for signature.

## 2022-09-20 NOTE — Telephone Encounter (Signed)
Application has been faxed to [employer].

## 2022-10-15 ENCOUNTER — Other Ambulatory Visit: Payer: Self-pay | Admitting: Cardiovascular Disease

## 2022-11-17 ENCOUNTER — Encounter: Payer: Self-pay | Admitting: Pulmonary Disease

## 2022-11-17 ENCOUNTER — Ambulatory Visit: Payer: Medicare Other | Admitting: Pulmonary Disease

## 2022-11-17 VITALS — BP 124/80 | HR 76 | Temp 97.8°F | Ht 61.0 in | Wt 155.0 lb

## 2022-11-17 DIAGNOSIS — E049 Nontoxic goiter, unspecified: Secondary | ICD-10-CM

## 2022-11-17 DIAGNOSIS — I422 Other hypertrophic cardiomyopathy: Secondary | ICD-10-CM | POA: Diagnosis not present

## 2022-11-17 DIAGNOSIS — J449 Chronic obstructive pulmonary disease, unspecified: Secondary | ICD-10-CM | POA: Diagnosis not present

## 2022-11-17 DIAGNOSIS — J9611 Chronic respiratory failure with hypoxia: Secondary | ICD-10-CM | POA: Diagnosis not present

## 2022-11-17 NOTE — Progress Notes (Signed)
Subjective:    Patient ID: Stacey Sandoval, female    DOB: 01/27/1951, 71 y.o.   MRN: [id number] Patient Care Team: [facility] as PCP - Anibal Henderson, Queen Blossom, MD as Referring Physician (Otolaryngology)  Chief Complaint  Patient presents with   Follow-up    SOB if she has a lot of fluid. No wheezing or cough.     HPI The patient is a 71 year old smoker with a 30-pack-year history of smoking and history as noted below, who follows for shortness of breath and COPD.  We last saw the patient on 01 September 2022 at that time she appeared to be fairly well compensated on Breztri 2 puffs twice a day and as needed albuterol.  She also has been noted to have a very large substernal goiter and there is concern of constriction of the trachea.  Flow-volume loop did not show overt obstruction however spirometric values and lung volumes varied significantly raising the possibility of significant air trapping.  Recall that the patient was admitted on 07 March 2022 through 16 March 2022 to [facility] for a COPD exacerbation.  She has been oxygen dependent since then.  She has been on 2 L/min via nasal cannula 24/7.  She does note that oxygen does help her dyspnea significantly.  The patient also notes that the Markus Daft has helped her significantly with her dyspnea on exertion.  She does not endorse any chest pain.  No lower extremity edema or calf tenderness.  Cough is mostly dry and at times productive of clear to white sputum.  Shortness of breath is mostly with exertion never with rest.  She has had issues with diastolic dysfunction and volume overload due to the same and notes that her shortness of breath also is aggravated by this.  She is to go on an out-of-town trip that requires airplane travel.  Recommended that she touch base with DME company for travel oxygen, she will need a POC that is approved by the FAA.  She cannot be without O2.  Recall aircraft are relatively pressurized  to 8000 feet.   She does not endorse any other symptomatology.  She unfortunately continues to smoke half a pack of cigarettes per day.  DATA 02/28/2022 echocardiogram: LVEF 65 to 70%.  Moderate asymmetric left ventricular hypertrophy of the basal septal segment.  Grade 1 DD, moderate LVOT obstruction with peak gradient of 50 mmHg at rest and 59 mmHg with Valsalva. 04/02/2022 CT chest: Diffuse coronary artery calcifications, mildly prominent mediastinal lymph nodes, enlargement of the thyroid gland with substernal extension, gland has numerous nodules and masses and narrows the trachea near the thoracic inlet.  Previously noted apical density in the left apical lung resolved.  Scarring in the right middle lobe and lingula.  Calcified granuloma in the left lower lobe and left lung base.  Consistent with old granulomatous disease. 08/30/2022 PFTs: Combined restrictive/obstructive physiology.  FEV1 0.93 L or 48% predicted, FVC 1.23 L or 48% predicted, FEV1/FVC 75%, there is significant bronchodilator response, there is no overt reduction in lung volumes with mild air trapping.  Diffusion capacity mildly reduced to low normal.  Flow volume loop did not show evidence of fixed upper airway obstruction.  Review of Systems A 10 point review of systems was performed and it is as noted above otherwise negative.  Patient Active Problem List   Diagnosis Date Noted   Acute on chronic diastolic CHF (congestive heart failure) (HCC) 03/16/2022   Hypertrophic cardiomegaly 03/16/2022  Hypokalemia 03/16/2022   Hypomagnesemia 03/14/2022   Atrial fibrillation with rapid ventricular response (HCC) 03/11/2022   Rhinovirus infection 03/10/2022   Current every day smoker 03/10/2022   Obesity (BMI 30-39.9) 03/10/2022   Acute hypoxemic respiratory failure (HCC) 03/09/2022   Insomnia 03/08/2022   Essential hypertension 03/08/2022   Hyperlipidemia 03/08/2022   Hyponatremia 03/08/2022   Acute pulmonary edema (HCC)  03/08/2022   COPD exacerbation (HCC) 03/08/2022   Social History   Tobacco Use   Smoking status: Every Day    Packs/day: 1.00    Years: 40.00    Additional pack years: 0.00    Total pack years: 40.00    Types: Cigarettes   Smokeless tobacco: Never   Tobacco comments:    0.5PPD 05/31/2023  Substance Use Topics   Alcohol use: Yes    Alcohol/week: 7.0 standard drinks of alcohol    Types: 7 Glasses of wine per week    Comment: one glass of wine daily after dinner   No Known Allergies  Current Meds  Medication Sig   albuterol (PROVENTIL) (2.5 MG/3ML) 0.083% nebulizer solution Take 3 mLs (2.5 mg total) by nebulization every 6 (six) hours as needed for wheezing or shortness of breath.   Budeson-Glycopyrrol-Formoterol (BREZTRI AEROSPHERE) 160-9-4.8 MCG/ACT AERO Inhale 2 puffs into the lungs in the morning and at bedtime.   diltiazem (CARDIZEM) 30 MG tablet Take 1 tablet (30 mg total) by mouth as needed (atrial fibrillation).   famotidine (PEPCID) 40 MG tablet Take 40 mg by mouth 2 (two) times daily.   gabapentin (NEURONTIN) 300 MG capsule Take 300 mg every am & 600 in the pm   mirtazapine (REMERON) 45 MG tablet Take 45 mg by mouth at bedtime.   ondansetron (ZOFRAN-ODT) 4 MG disintegrating tablet Take 4 mg by mouth every 8 (eight) hours as needed.   OXYGEN Inhale into the lungs as needed. 2-3 Liters   apixaban (ELIQUIS) 5 MG TABS tablet Take 1 tablet (5 mg total) by mouth 2 (two) times daily.   atorvastatin (LIPITOR) 20 MG tablet Take 20 mg by mouth daily.   bisoprolol (ZEBETA) 5 MG tablet TAKE 1 TABLET BY MOUTH TWICE  DAILY   diltiazem (CARDIZEM CD) 180 MG 24 hr capsule Take 1 capsule (180 mg total) by mouth daily.   potassium chloride SA (KLOR-CON M) 20 MEQ tablet Take 1 tablet daily, alternating with 2 tablets every other day   Immunization History  Administered Date(s) Administered   Fluad Quad(high Dose 65+) 10/10/2022   Influenza-Unspecified 09/20/2021   PFIZER(Purple  Top)SARS-COV-2 Vaccination 02/14/2020, 02/29/2020, 10/10/2022   Pfizer Covid-19 Vaccine Bivalent Booster 53yrs & up 09/09/2020, 08/07/2021       Objective:   Physical Exam BP 124/80 (BP Location: Left Arm, Cuff Size: Normal)   Pulse 76   Temp 97.8 F (36.6 C)   Ht 5\' 1"  (1.549 m)   Wt 155 lb (70.3 kg)   SpO2 92%   BMI 29.29 kg/m   SpO2: 92 % O2 Device: None (Room air)  GENERAL: Well-developed, well-nourished woman, no acute distress.  Comfortable on nasal cannula O2. HEAD: Normocephalic, atraumatic.  EYES: Pupils equal, round, reactive to light.  No scleral icterus.  MOUTH: Dentition is intact.  Oral mucosa moist. NECK: Supple. + thyromegaly (goiter). Trachea midline. No JVD.  No adenopathy. PULMONARY: Good air entry bilaterally.  No adventitious sounds.  There is evidence of venous congestion on the anterior chest wall. CARDIOVASCULAR: S1 and S2. Regular rate and rhythm.  No rubs, murmurs  or gallops heard. ABDOMEN: Benign. MUSCULOSKELETAL: No joint deformity, no clubbing, no edema.  NEUROLOGIC: No overt focal deficit, no gait disturbance, speech is fluent. SKIN: Intact,warm,dry. PSYCH: Mood and behavior normal.      Assessment & Plan:     ICD-10-CM   1. Stage 2 moderate COPD by GOLD classification (HCC)  J44.9    Continue Breztri 2 puffs twice a day Continue as needed albuterol    2. 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 Hypertrophic cardiomyopathy (HCC)  I42.2    This issue of complexity to her management Makes volume management a challenge Continue follow-up with cardiology     Will see the patient in follow-up in 3 months time she is to contact us prior to that time should any new problems arise.  Gailen Shelter, MD Advanced  Bronchoscopy PCCM [facility]    *This note was dictated using voice recognition software/Dragon.  Despite best efforts to proofread, errors can occur which can change the meaning. Any transcriptional errors that result from this process are unintentional and may not be fully corrected at the time of dictation.

## 2022-11-17 NOTE — Patient Instructions (Signed)
Continue using Breztri 2 puffs twice a day.  Continue as needed albuterol.  Your upcoming airline trip you will need to wear oxygen during flight.  Check with your airline to review the requirements.  I do recommend you get the RSV vaccine.  These can be obtained at either [facility] or [facility].  We will see you in follow-up in 3 months time call sooner should any new problems arise.

## 2022-12-17 ENCOUNTER — Other Ambulatory Visit: Payer: Self-pay | Admitting: Cardiovascular Disease

## 2023-01-06 ENCOUNTER — Encounter: Payer: Self-pay | Admitting: Pulmonary Disease

## 2023-02-06 NOTE — Progress Notes (Unsigned)
Cardiology Office Note  Date:  02/07/2023   ID:  Stacey Sandoval, DOB 11-22-1951, MRN [id number]  PCP:  [facility]   Chief Complaint  Patient presents with   6 month follow up     Patient c/o swelling in feet, shortness of breath with walking a short distance. Medications reviewed by the patient verbally.     HPI:  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 Who presents for follow-up of her atrial fibrillation, diastolic CHF  Last seen in clinic by myself August 2023 Presents today with her daughter [facility]  Daughter reports she is sitting at computer table with her legs down Not wearing compression hose Feels her heart rhythm has been well-controlled, denies symptoms concerning for paroxysmal atrial fibrillation  Several episodes of atrial fibrillation in March, April 2023 In the setting of COPD exacerbation, recurrence of A-fib April 01, 2022 in the ER  Taking Lasix 80 daily  EKG personally reviewed by myself on todays visit Normal sinus rhythm with rate 74 bpm no significant ST-T wave changes  Past medical history reviewed echocardiogram dated March 08, 2022  showed ejection fraction 65 to 70% with moderate asymmetric left ventricular hypertrophy of the basal-septal segment. Moderate LVOT obstruction  with peak gradient of 50 mm Hg at rest and 59 mm Hg with Valsalva.   March 27th was NSR, admitted to the hospital March 31st was in atrial fib, presumably from nebulizer treatment in the setting of COPD During her hospital course it was documented she converted back to normal sinus rhythm with rate  controlling agents, not antiarrhythmics  Was at home when she developed acute shortness of breath went back to the emergency room April 21st back to the ER, atrial fibrillation, had SOB In atrial fibrillation on today's EKG  It appears that the time of her discharge from the hospital her valsartan HCTZ was held.  We will update her medication list  Still smoking 5-6 a day On chronic home oxygen 2 L, has a portable generator  PMH:   has a past medical history of Atrial fibrillation ([place]), CHF (congestive heart failure) ([place]), Complication of anesthesia, COPD (chronic obstructive pulmonary disease) ([place]), Enlarged thyroid, Hyperlipidemia, Hypertension, Smokers' cough ([place]), Tobacco use, and Wears dentures.  PSH:    Past Surgical History:  Procedure Laterality Date   ABDOMINAL HYSTERECTOMY  1990   BREAST LUMPECTOMY Right 1974   CATARACT EXTRACTION W/PHACO Right 06/21/2022   Procedure: CATARACT EXTRACTION PHACO AND INTRAOCULAR LENS PLACEMENT (IOC) RIGHT 9.09 00:51.1;  Surgeon: Birder Robson, MD;  Location: [facility];  Service: Ophthalmology;  Laterality: Right;   CATARACT EXTRACTION W/PHACO Left 07/05/2022   Procedure: CATARACT EXTRACTION PHACO AND INTRAOCULAR LENS PLACEMENT (IOC) LEFT 7.05 00:45.9;  Surgeon: Birder Robson, MD;  Location: [facility];  Service: Ophthalmology;  Laterality: Left;   CHOLECYSTECTOMY  2013    Current Outpatient Medications  Medication Sig Dispense Refill   albuterol (PROVENTIL) (2.5 MG/3ML) 0.083% nebulizer solution Take 3 mLs (2.5 mg total) by nebulization every 6 (six) hours as needed for wheezing or shortness of breath. 75 mL 2   apixaban (ELIQUIS) 5 MG TABS tablet Take 1 tablet (5 mg total) by  mouth 2 (two) times daily. 180 tablet 3   atorvastatin (LIPITOR) 20 MG tablet Take 20 mg by mouth daily.     bisoprolol (ZEBETA) 5 MG tablet TAKE 1 TABLET BY MOUTH TWICE  DAILY 180 tablet 0   Budeson-Glycopyrrol-Formoterol (BREZTRI AEROSPHERE)  160-9-4.8 MCG/ACT AERO Inhale 2 puffs into the lungs in the morning and at bedtime. 5.9 g 0   diltiazem (CARDIZEM CD) 180 MG 24 hr capsule Take 1 capsule (180 mg total) by mouth daily. 90 capsule 3   diltiazem (CARDIZEM) 30 MG tablet Take 1 tablet (30 mg total) by mouth as needed (atrial fibrillation). 30 tablet 3   famotidine (PEPCID) 40 MG tablet Take 40 mg by mouth 2 (two) times daily.     furosemide (LASIX) 40 MG tablet Take 80 mg by mouth daily.     gabapentin (NEURONTIN) 300 MG capsule Take 300 mg every am & 600 in the pm     mirtazapine (REMERON) 45 MG tablet Take 45 mg by mouth at bedtime.     ondansetron (ZOFRAN-ODT) 4 MG disintegrating tablet Take 4 mg by mouth every 8 (eight) hours as needed.     OXYGEN Inhale into the lungs as needed. 2-3 Liters     potassium chloride SA (KLOR-CON M) 20 MEQ tablet Take 1 tablet daily, alternating with 2 tablets every other day 45 tablet 3   Biotin 10 MG CAPS  (Patient not taking: Reported on 02/07/2023)     Budeson-Glycopyrrol-Formoterol (BREZTRI AEROSPHERE) 160-9-4.8 MCG/ACT AERO Inhale 2 puffs into the lungs in the morning and at bedtime. (Patient not taking: Reported on 02/07/2023) 10.7 g 11   Budeson-Glycopyrrol-Formoterol (BREZTRI AEROSPHERE) 160-9-4.8 MCG/ACT AERO Inhale 2 puffs into the lungs in the morning and at bedtime. (Patient not taking: Reported on 02/07/2023) 5.9 g 0   No current facility-administered medications for this visit.    Allergies:   Patient has no known allergies.   Social History:  The patient  reports that she has been smoking cigarettes. She has a 40.00 pack-year smoking history. She has never used smokeless tobacco. She reports current alcohol use of about 7.0 standard drinks of alcohol per week. She reports that she does not use drugs.   Family History:   family history includes Heart attack in her father; Heart disease in her father and mother; Hypertension in her mother.    Review of Systems: Review of Systems   Constitutional: Negative.   HENT: Negative.    Respiratory: Negative.    Cardiovascular:  Positive for leg swelling.  Gastrointestinal: Negative.   Musculoskeletal: Negative.   Neurological: Negative.   Psychiatric/Behavioral: Negative.    All other systems reviewed and are negative.   PHYSICAL EXAM: VS:  BP 112/62 (BP Location: Left Arm, Patient Position: Sitting, Cuff Size: Normal)   Pulse 74   Ht '5\' 1"'$  (1.549 m)   Wt 155 lb 4 oz (70.4 kg)   SpO2 92%   BMI 29.33 kg/m  , BMI Body mass index is 29.33 kg/m. Constitutional:  oriented to person, place, and time. No distress.  HENT:  Head: Grossly normal Eyes:  no discharge. No scleral icterus.  Neck: No JVD, no carotid bruits  Cardiovascular: Regular rate and rhythm, no murmurs appreciated Nonpitting lower extremity edema Pulmonary/Chest: Clear to auscultation bilaterally, no wheezes or rails Abdominal: Soft.  no distension.  no tenderness.  Musculoskeletal: Normal range of motion Neurological:  normal muscle tone. Coordination normal. No atrophy Skin: Skin warm and dry Psychiatric: normal affect, pleasant  Recent Labs: 03/07/2022: TSH 0.208 03/16/2022: Magnesium 1.7 04/01/2022: ALT 18; B Natriuretic Peptide 133.7; BUN 11; Creatinine, Ser 0.66; Hemoglobin 12.7; Platelets 250; Potassium 3.5; Sodium 139    Lipid Panel No results found for: "CHOL", "HDL", "LDLCALC", "TRIG"    Wt Readings from Last 3 Encounters:  02/07/23 155 lb 4 oz (70.4 kg)  11/17/22 155 lb (70.3 kg)  09/01/22 152 lb (68.9 kg)     ASSESSMENT AND PLAN:  Problem List Items Addressed This Visit       Cardiology Problems   Essential hypertension   Relevant Medications   furosemide (LASIX) 40 MG tablet   Atrial fibrillation with rapid ventricular response (HCC) - Primary   Relevant Medications   furosemide (LASIX) 40 MG tablet   Acute on chronic diastolic CHF (congestive heart failure) (HCC)   Relevant Medications   furosemide (LASIX) 40 MG tablet    Hypertrophic cardiomegaly   Relevant Medications   furosemide (LASIX) 40 MG tablet   Hyperlipidemia   Relevant Medications   furosemide (LASIX) 40 MG tablet   Other Visit Diagnoses     Chronic obstructive pulmonary disease, unspecified COPD type ([place])         Paroxysmal atrial fibrillation Several episodes atrial fibrillation March 2023 with recurrence of A-fib April 01, 2022 in the ER Symptoms have been well-controlled on current medication regiment Recommend she continue bisoprolol, diltiazem, Eliquis Discussed diltiazem could contribute to leg swelling but she does not want to change at this time  COPD with recent exacerbation Smoking cessation recommended on chronic oxygen On inhalers, followed by pulmonary No recent COPD exacerbation  Hypertrophic cardiomyopathy Noted presumably in the setting of atrial fibrillation No significant murmur on exam Asymptomatic, continue beta-blocker, calcium channel blocker Continue rhythm control  Leg edema Likely venous insufficiency, recommended compression hose    Total encounter time more than 40 minutes  Greater than 50% was spent in counseling and coordination of care with the patient    Signed, Esmond Plants, M.D., Ph.D. [place], [place]

## 2023-02-07 ENCOUNTER — Encounter: Payer: Self-pay | Admitting: Cardiovascular Disease

## 2023-02-07 ENCOUNTER — Ambulatory Visit: Payer: Medicare HMO | Attending: Cardiovascular Disease | Admitting: Cardiovascular Disease

## 2023-02-07 VITALS — BP 112/62 | HR 74 | Ht 61.0 in | Wt 155.2 lb

## 2023-02-07 DIAGNOSIS — I5033 Acute on chronic diastolic (congestive) heart failure: Secondary | ICD-10-CM | POA: Diagnosis not present

## 2023-02-07 DIAGNOSIS — E782 Mixed hyperlipidemia: Secondary | ICD-10-CM

## 2023-02-07 DIAGNOSIS — J449 Chronic obstructive pulmonary disease, unspecified: Secondary | ICD-10-CM

## 2023-02-07 DIAGNOSIS — I4891 Unspecified atrial fibrillation: Secondary | ICD-10-CM | POA: Diagnosis not present

## 2023-02-07 DIAGNOSIS — I10 Essential (primary) hypertension: Secondary | ICD-10-CM

## 2023-02-07 DIAGNOSIS — I517 Cardiomegaly: Secondary | ICD-10-CM

## 2023-02-07 MED ORDER — FUROSEMIDE 80 MG PO TABS: 80.0000 mg | ORAL_TABLET | Freq: Every day | ORAL | 3 refills | Status: DC

## 2023-02-07 MED ORDER — POTASSIUM CHLORIDE CRYS ER 20 MEQ PO TBCR: EXTENDED_RELEASE_TABLET | ORAL | 3 refills | Status: DC

## 2023-02-07 MED ORDER — ATORVASTATIN CALCIUM 20 MG PO TABS: 20.0000 mg | ORAL_TABLET | Freq: Every day | ORAL | 2 refills | Status: DC

## 2023-02-07 NOTE — Patient Instructions (Signed)
Medication Instructions:  No changes  If you need a refill on your cardiac medications before your next appointment, please call your pharmacy.   Lab work: No new labs needed  Testing/Procedures: No new testing needed  Follow-Up: At [facility], you and your health needs are our priority.  As part of our continuing mission to provide you with exceptional heart care, we have created designated Provider Care Teams.  These Care Teams include your primary Cardiologist (physician) and Advanced Practice Providers (APPs -  Physician Assistants and Nurse Practitioners) who all work together to provide you with the care you need, when you need it.  You will need a follow up appointment in 12 months  Providers on your designated Care Team:   Christopher Berge, NP Ryan Dunn, PA-C Cadence Furth, PA-C  COVID-19 Vaccine Information can be found at: [email or web address] For questions related to vaccine distribution or appointments, please email [email or web address] or call [phone].   

## 2023-02-24 ENCOUNTER — Encounter: Payer: Self-pay | Admitting: Pulmonary Disease

## 2023-04-16 ENCOUNTER — Other Ambulatory Visit: Payer: Self-pay | Admitting: Cardiovascular Disease

## 2023-05-02 ENCOUNTER — Other Ambulatory Visit: Payer: Self-pay | Admitting: Cardiovascular Disease

## 2023-05-09 ENCOUNTER — Other Ambulatory Visit: Payer: Self-pay | Admitting: Otolaryngology

## 2023-05-09 DIAGNOSIS — E042 Nontoxic multinodular goiter: Secondary | ICD-10-CM

## 2023-05-12 ENCOUNTER — Other Ambulatory Visit: Payer: Self-pay | Admitting: Cardiovascular Disease

## 2023-05-12 NOTE — Telephone Encounter (Signed)
Refill request

## 2023-05-12 NOTE — Telephone Encounter (Signed)
Prescription refill request for Eliquis received. Indication:afib Last office visit:2/24 Scr:0.5  8/23 Age: 72 Weight:70.4  kg  Prescription refilled

## 2023-05-16 ENCOUNTER — Ambulatory Visit
Admission: RE | Admit: 2023-05-16 | Discharge: 2023-05-16 | Disposition: A | Discharge status: Home / Self Care | Payer: Medicare HMO | Source: Ambulatory Visit | Attending: Otolaryngology | Admitting: Otolaryngology

## 2023-05-16 DIAGNOSIS — E042 Nontoxic multinodular goiter: Secondary | ICD-10-CM | POA: Insufficient documentation

## 2023-05-16 LAB — POCT I-STAT CREATININE: Creatinine, Ser: 0.7 mg/dL (ref 0.44–1.00)

## 2023-05-16 MED ORDER — IOHEXOL 300 MG/ML  SOLN
75.0000 mL | Freq: Once | INTRAMUSCULAR | Status: AC | PRN
  Administered 2023-05-16: 75 mL via INTRAVENOUS

## 2023-05-22 ENCOUNTER — Telehealth: Payer: Self-pay

## 2023-05-22 NOTE — Telephone Encounter (Signed)
Received surgical clearance from Dr. Leandrew Koyanagi office at [facility]. The patient is being scheduled for a Total Thyroidectomy, possible mini-sternotomy. Surgery date is June/July 2024.   The patient is not scheduled to see you until 08/10/2023. She has not been seen in 6 months (11/17/2022).  Dr. Jayme Cloud, can I add her to your schedule next Thursday (6/19) at 1:30pm or 4:00pm?

## 2023-05-23 NOTE — Telephone Encounter (Signed)
Yes.  May add to schedule.

## 2023-05-24 NOTE — Telephone Encounter (Signed)
Appt has been scheduled for 6/19 at 2:00pm. I have notified the patient's daughter(DPR).  Nothing further needed.

## 2023-05-31 ENCOUNTER — Ambulatory Visit: Payer: Medicare HMO | Admitting: Pulmonary Disease

## 2023-05-31 ENCOUNTER — Encounter: Payer: Self-pay | Admitting: Pulmonary Disease

## 2023-05-31 VITALS — BP 120/78 | HR 81 | Temp 97.7°F | Ht 61.0 in | Wt 155.2 lb

## 2023-05-31 DIAGNOSIS — J9611 Chronic respiratory failure with hypoxia: Secondary | ICD-10-CM | POA: Diagnosis not present

## 2023-05-31 DIAGNOSIS — Z01811 Encounter for preprocedural respiratory examination: Secondary | ICD-10-CM | POA: Diagnosis not present

## 2023-05-31 DIAGNOSIS — I422 Other hypertrophic cardiomyopathy: Secondary | ICD-10-CM

## 2023-05-31 DIAGNOSIS — J9612 Chronic respiratory failure with hypercapnia: Secondary | ICD-10-CM

## 2023-05-31 DIAGNOSIS — E049 Nontoxic goiter, unspecified: Secondary | ICD-10-CM

## 2023-05-31 DIAGNOSIS — J4489 Other specified chronic obstructive pulmonary disease: Secondary | ICD-10-CM

## 2023-05-31 DIAGNOSIS — F17210 Nicotine dependence, cigarettes, uncomplicated: Secondary | ICD-10-CM

## 2023-05-31 DIAGNOSIS — J449 Chronic obstructive pulmonary disease, unspecified: Secondary | ICD-10-CM

## 2023-05-31 NOTE — Patient Instructions (Addendum)
Please wear your oxygen as prescribed 2 L/min.  Continue using Breztri 2 puffs twice a day, I recommend that until your surgery for your thyroid you use your nebulizer twice a day this will help clear your lungs.  We have ordered you an incentive spirometer so you can start using it, this will exercise your lungs.  Your risk for the surgery that is proposed is moderate to high this cannot be further reduced.  You should be evaluated by the cardiologist prior to potential surgery.  We will see him in follow-up in 3 months time call sooner should any new problems arise.

## 2023-05-31 NOTE — Progress Notes (Signed)
Subjective:    Patient ID: Stacey Sandoval, female    DOB: [birth year], 72 y.o.   MRN: [id number]  Patient Care Team: [facility] as PCP - General  Chief Complaint  Patient presents with   Follow-up    Breathing is the same. DOE. No wheezing or cough.     HPI Stacey Sandoval is a 72 year old current smoker with a 40-pack-year history of smoking and known COPD and chronic hypoxic/hypercarbic respiratory failure who presents for preoperative evaluation for thyroidectomy.  She is kindly referred by Dr. Rodney Langton of [facility].  Stacey Sandoval was last seen on 17 November 2022 and at that time was recommended to have a 59-month follow-up.  She did not follow through with this.  She is still smoking however states that today she has not had a cigarette at all.  She has been on Breztri 2 puffs twice a day and as needed albuterol.  She is on oxygen at 2 L/min but admits that she is not consistent using it.  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  This may be causing worsening of her respiratory failure.  Her dyspnea has been at baseline.  Cough has been rare and nonproductive.  She has had no hemoptysis.  Does not endorse orthopnea or paroxysmal nocturnal dyspnea.  No recent tachypalpitations.  No chest pain.   She does have a hypertrophic cardiomyopathy with moderate asymmetric left ventricular hypertrophy and moderate LVOT obstruction with Valsalva.  I have recommended that she visit with cardiology prior to her surgery to get a risk assessment as well.  Patient does not endorse any other symptomatology today.   DATA 02/28/2022 echocardiogram: LVEF 65 to 70%.  Moderate asymmetric left ventricular hypertrophy of the basal septal segment.  Grade 1 DD, moderate LVOT obstruction with peak gradient of 50 mmHg at rest and 59 mmHg with Valsalva. 04/02/2022 CT chest: Diffuse coronary artery calcifications, mildly prominent mediastinal lymph nodes,  enlargement of the thyroid gland with substernal extension, gland has numerous nodules and masses and narrows the trachea near the thoracic inlet.  Previously noted apical density in the left apical lung resolved.  Scarring in the right middle lobe and lingula.  Calcified granuloma in the left lower lobe and left lung base.  Consistent with old granulomatous disease. 08/30/2022 PFTs: Combined restrictive/obstructive physiology.  FEV1 0.93 L or 48% predicted, FVC 1.23 L or 48% predicted, FEV1/FVC 75%, there is significant bronchodilator response, there is no overt reduction in lung volumes with mild air trapping.  Diffusion capacity mildly reduced to low normal.  Flow volume loop did not show evidence of fixed upper airway obstruction.    Review of Systems A 10 point review of systems was performed and it is as noted above otherwise negative.   Patient Active Problem List   Diagnosis Date Noted   Acute on chronic diastolic CHF (congestive heart failure) (HCC) 03/16/2022   Hypertrophic cardiomegaly 03/16/2022   Hypokalemia 03/16/2022   Hypomagnesemia 03/14/2022   Atrial fibrillation with rapid ventricular response (HCC) 03/11/2022   Rhinovirus infection 03/10/2022   Current every day smoker 03/10/2022   Obesity (BMI 30-39.9) 03/10/2022   Acute hypoxemic respiratory failure (HCC) 03/09/2022   Insomnia 03/08/2022   Essential hypertension 03/08/2022   Hyperlipidemia 03/08/2022   Hyponatremia 03/08/2022   Acute pulmonary edema (HCC) 03/08/2022   COPD exacerbation (HCC) 03/08/2022    Social History   Tobacco Use   Smoking status: Every Day  Packs/day: 1.00    Years: 40.00    Additional pack years: 0.00    Total pack years: 40.00    Types: Cigarettes   Smokeless tobacco: Never   Tobacco comments:    0.5PPD 05/31/2023  Substance Use Topics   Alcohol use: Yes    Alcohol/week: 7.0 standard drinks of alcohol    Types: 7 Glasses of wine per week    Comment: one glass of wine daily after  dinner    No Known Allergies  Current Meds  Medication Sig   albuterol (PROVENTIL) (2.5 MG/3ML) 0.083% nebulizer solution Take 3 mLs (2.5 mg total) by nebulization every 6 (six) hours as needed for wheezing or shortness of breath.   atorvastatin (LIPITOR) 20 MG tablet Take 1 tablet (20 mg total) by mouth daily.   bisoprolol (ZEBETA) 5 MG tablet TAKE 1 TABLET BY MOUTH EVERY DAY   Budeson-Glycopyrrol-Formoterol (BREZTRI AEROSPHERE) 160-9-4.8 MCG/ACT AERO Inhale 2 puffs into the lungs in the morning and at bedtime.   diltiazem (CARDIZEM CD) 180 MG 24 hr capsule TAKE 1 CAPSULE(180 MG) BY MOUTH DAILY   diltiazem (CARDIZEM) 30 MG tablet Take 1 tablet (30 mg total) by mouth as needed (atrial fibrillation).   ELIQUIS 5 MG TABS tablet TAKE 1 TABLET(5 MG) BY MOUTH TWICE DAILY   famotidine (PEPCID) 40 MG tablet Take 40 mg by mouth 2 (two) times daily.   furosemide (LASIX) 80 MG tablet Take 1 tablet (80 mg total) by mouth daily.   gabapentin (NEURONTIN) 300 MG capsule Take 300 mg every am & 600 in the pm   mirtazapine (REMERON) 45 MG tablet Take 45 mg by mouth at bedtime.   ondansetron (ZOFRAN-ODT) 4 MG disintegrating tablet Take 4 mg by mouth every 8 (eight) hours as needed.   OXYGEN Inhale into the lungs as needed. 2-3 Liters   potassium chloride SA (KLOR-CON M) 20 MEQ tablet Take 1 tablet daily, alternating with 2 tablets every other day    Immunization History  Administered Date(s) Administered   Fluad Quad(high Dose 65+) 10/10/2022   Influenza-Unspecified 09/20/2021   PFIZER(Purple Top)SARS-COV-2 Vaccination 02/14/2020, 02/29/2020, 10/10/2022   Pfizer Covid-19 Vaccine Bivalent Booster 30yrs & up 09/09/2020, 08/07/2021        Objective:     BP 120/78 (BP Location: Left Arm, Cuff Size: Normal)   Pulse 81   Temp 97.7 F (36.5 C)   Ht 5\' 1"  (1.549 m)   Wt 155 lb 3.2 oz (70.4 kg)   SpO2 (!) 89%   BMI 29.32 kg/m   SpO2: (!) 89 % O2 Device: None (Room air)  GENERAL: Well-developed,  overweight woman, fully ambulatory, plethoric appearing, no acute distress.  Comfortable on nasal cannula O2. HEAD: Normocephalic, atraumatic.  EYES: Pupils equal, round, reactive to light.  No scleral icterus.  MOUTH: Dentures upper and lower.  Oral mucosa moist. NECK: Supple. + thyromegaly (goiter). Trachea midline. No JVD.  No adenopathy. PULMONARY: Good air entry bilaterally.  Coarse, otherwise no adventitious sounds.  Prominent veins/vessels at upper chest. CARDIOVASCULAR: S1 and S2. Regular rate and rhythm.  No rubs, murmurs or gallops heard. ABDOMEN: Benign. MUSCULOSKELETAL: No joint deformity, no clubbing, no edema.  NEUROLOGIC: No overt focal deficit, no gait disturbance, speech is fluent. SKIN: Intact,warm,dry. PSYCH: Mood and behavior normal.   Ambulatory oximetry was performed today: At rest on room air oxygen saturation was 92%, the patient ambulated at a low/moderate pace, completed 3 laps, O2 nadir 86%, moderate shortness of breath.  Patient placed on oxygen  at 2 L/min was able to maintain saturations at 90% or better.  Resting heart rate was 80 bpm at maximum for this exercise 91 bpm.  Representative image from CT chest performed 16 May 2023 showing very large goiter with substernal extension and impingement of the trachea:       Assessment & Plan:     ICD-10-CM   1. Stage 3 severe COPD by GOLD classification (HCC)  J44.9 Ambulatory Referral for DME   Continue Breztri 2 puffs twice a day Continue as needed albuterol Use albuterol via nebulizer at least twice a day  Incentive spirometer    2. 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 Hypertrophic  cardiomyopathy (HCC)  I42.2    Recommend cardiology pre-operative evaluation Patient has appointment with cardiology on 24 June    5. Tobacco dependence due to cigarettes  F17.210    Patient counseled regarding discontinuation of smoking States has not smoked at all today States had quit date for 1 July Discussed starting quit date today    6. Preoperative respiratory examination  Z01.811    Patient is a moderate to high risk for pulmonary complications postprocedure May need ventilatory support postop This risk cannot be further reduced      Orders Placed This Encounter  Procedures   Ambulatory Referral for DME    Referral Priority:   Routine    Referral Type:   Durable Medical Equipment Purchase    Number of Visits Requested:   1    Patient will be provided with an incentive spirometer so she will start working with this device prior to procedure.  She was also instructed to use her albuterol nebulizer at least several times a day to keep secretions mobile and improve pulmonary hygiene.  Respiratory wise, she is as compensated as she is to get.  Was reminded to use her oxygen at 2 L/min at all times.  She understands that she may require mechanical ventilation for a few days postprocedure.  However, her thyroid goiter appears to be impinging on her trachea, carotid arteries and jugular veins and excision may improve her respiratory/cardiovascular status.  The patient does have issues with hypertrophic cardiomyopathy and paroxysmal atrial fibrillation.  I recommend that cardiology also weigh in on her cardiac risk.  She will be seen by cardiology on 05 June 2023.  Discussed all the above with Dr. Roma Schanz via telephone conversation.   Gailen Shelter, MD Advanced Bronchoscopy PCCM [facility]    *This note was dictated using voice recognition software/Dragon.  Despite best efforts to proofread, errors can occur which can change the meaning. Any transcriptional  errors that result from this process are unintentional and may not be fully corrected at the time of dictation.

## 2023-06-04 NOTE — Progress Notes (Signed)
Cardiology Office Note  Date:  06/05/2023   ID:  Stacey Sandoval, DOB [birth year], MRN [id number]  PCP:  [facility]   Chief Complaint  Patient presents with   Surgical clearance     Patient needs a cardiac clearance for total thyroidectomy. Medications reviewed by the patient verbally.     HPI:  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   Followed by Dr. Rodney Langton of [facility].   Presents today with her daughter No recent COPD exacerbation No chest pain concerning for angina Mild chronic shortness of breath on exertion Denies any tachypalpitations concerning for atrial fibrillation Feels the low-dose bisoprolol and diltiazem doses working well Blood pressure stable Denies significant leg swelling, no abdominal distention, no PND orthopnea  Renal function stable on Lasix 80 daily On supplemental potassium, potassium continues to run low 3.5  Prior episodes of atrial fibrillation March 2023, April 2023 In the setting of COPD exacerbation, recurrence of A-fib April 01, 2022 in the ER  Trying to quit smoking next week, has set a quit date  EKG personally reviewed by myself on todays visit Normal sinus rhythm with rate 102 bpm no significant ST-T wave changes  Past medical history reviewed echocardiogram dated March 08, 2022  showed ejection fraction 65 to 70% with moderate asymmetric left ventricular hypertrophy of the basal-septal segment. Moderate LVOT obstruction  with peak  gradient of 50 mm Hg at rest and 59 mm Hg with Valsalva.   March 27th was NSR, admitted to the hospital March 31st was in atrial fib, presumably from nebulizer treatment in the setting of COPD During her hospital course it was documented she converted back to normal sinus rhythm with rate controlling agents, not antiarrhythmics  Was at home when she developed acute shortness of breath went back to the emergency room April 21st back to the ER, atrial fibrillation, had SOB In atrial fibrillation on today's EKG  It appears that the time of her discharge from the hospital her valsartan HCTZ was held.  We will update her medication list  Still smoking 5-6 a day On chronic home oxygen 2 L, has a portable generator  PMH:   has a past medical history of Atrial fibrillation (HCC), CHF (congestive heart failure) (HCC), Complication of anesthesia, COPD (chronic obstructive pulmonary disease) (HCC), Enlarged thyroid, Hyperlipidemia, Hypertension, Smokers' cough (HCC), Tobacco use, and Wears dentures.  PSH:    Past Surgical History:  Procedure Laterality Date   ABDOMINAL HYSTERECTOMY  1990   BREAST LUMPECTOMY Right 1974   CATARACT EXTRACTION W/PHACO Right 06/21/2022   Procedure: CATARACT EXTRACTION PHACO AND INTRAOCULAR LENS PLACEMENT (IOC) RIGHT 9.09 00:51.1;  Surgeon: Galen Manila, MD;  Location: [facility];  Service: Ophthalmology;  Laterality: Right;   CATARACT EXTRACTION W/PHACO Left 07/05/2022   Procedure: CATARACT EXTRACTION PHACO AND INTRAOCULAR LENS PLACEMENT (IOC) LEFT 7.05 00:45.9;  Surgeon: Galen Manila, MD;  Location: [facility];  Service: Ophthalmology;  Laterality: Left;   CHOLECYSTECTOMY  2013    Current Outpatient Medications  Medication Sig Dispense Refill   albuterol (PROVENTIL) (  2.5 MG/3ML) 0.083% nebulizer solution Take 3 mLs (2.5 mg total) by nebulization every 6 (six) hours as needed for wheezing or shortness of breath. 75 mL 2   atorvastatin  (LIPITOR) 20 MG tablet Take 1 tablet (20 mg total) by mouth daily. 90 tablet 2   bisoprolol (ZEBETA) 5 MG tablet TAKE 1 TABLET BY MOUTH EVERY DAY 90 tablet 3   Budeson-Glycopyrrol-Formoterol (BREZTRI AEROSPHERE) 160-9-4.8 MCG/ACT AERO Inhale 2 puffs into the lungs in the morning and at bedtime. 5.9 g 0   diltiazem (CARDIZEM CD) 180 MG 24 hr capsule TAKE 1 CAPSULE(180 MG) BY MOUTH DAILY 90 capsule 2   diltiazem (CARDIZEM) 30 MG tablet Take 1 tablet (30 mg total) by mouth as needed (atrial fibrillation). 30 tablet 3   ELIQUIS 5 MG TABS tablet TAKE 1 TABLET(5 MG) BY MOUTH TWICE DAILY 180 tablet 3   famotidine (PEPCID) 40 MG tablet Take 40 mg by mouth 2 (two) times daily.     furosemide (LASIX) 80 MG tablet Take 1 tablet (80 mg total) by mouth daily. 90 tablet 3   gabapentin (NEURONTIN) 300 MG capsule Take 300 mg every am & 600 in the pm     mirtazapine (REMERON) 45 MG tablet Take 45 mg by mouth at bedtime.     ondansetron (ZOFRAN-ODT) 4 MG disintegrating tablet Take 4 mg by mouth every 8 (eight) hours as needed.     OXYGEN Inhale into the lungs as needed. 2-3 Liters     potassium chloride SA (KLOR-CON M) 20 MEQ tablet Take 1 tablet daily, alternating with 2 tablets every other day 135 tablet 3   Biotin 10 MG CAPS  (Patient not taking: Reported on 02/07/2023)     Budeson-Glycopyrrol-Formoterol (BREZTRI AEROSPHERE) 160-9-4.8 MCG/ACT AERO Inhale 2 puffs into the lungs in the morning and at bedtime. (Patient not taking: Reported on 06/05/2023) 5.9 g 0   No current facility-administered medications for this visit.    Allergies:   Patient has no known allergies.   Social History:  The patient  reports that she has been smoking cigarettes. She has a 40.00 pack-year smoking history. She has never used smokeless tobacco. She reports current alcohol use of about 7.0 standard drinks of alcohol per week. She reports that she does not use drugs.   Family History:   family history includes Heart attack in her  father; Heart disease in her father and mother; Hypertension in her mother.    Review of Systems: Review of Systems  Constitutional: Negative.   HENT: Negative.    Respiratory: Negative.    Cardiovascular:  Positive for leg swelling.  Gastrointestinal: Negative.   Musculoskeletal: Negative.   Neurological: Negative.   Psychiatric/Behavioral: Negative.    All other systems reviewed and are negative.   PHYSICAL EXAM: VS:  BP 120/64 (BP Location: Left Arm, Patient Position: Sitting, Cuff Size: Normal)   Pulse (!) 102   Ht 5\' 1"  (1.549 m)   Wt 155 lb 2 oz (70.4 kg)   SpO2 93%   BMI 29.31 kg/m  , BMI Body mass index is 29.31 kg/m. Constitutional:  oriented to person, place, and time. No distress.  HENT:  Head: Grossly normal Eyes:  no discharge. No scleral icterus.  Neck: No JVD, no carotid bruits  Cardiovascular: Regular rate and rhythm, no murmurs appreciated Pulmonary/Chest: Clear to auscultation bilaterally, no wheezes or rails Abdominal: Soft.  no distension.  no tenderness.  Musculoskeletal: Normal range of motion Neurological:  normal muscle tone. Coordination normal. No  atrophy Skin: Skin warm and dry Psychiatric: normal affect, pleasant  Recent Labs: 05/16/2023: Creatinine, Ser 0.70    Lipid Panel No results found for: "CHOL", "HDL", "LDLCALC", "TRIG"    Wt Readings from Last 3 Encounters:  06/05/23 155 lb 2 oz (70.4 kg)  05/31/23 155 lb 3.2 oz (70.4 kg)  02/07/23 155 lb 4 oz (70.4 kg)     ASSESSMENT AND PLAN:  Problem List Items Addressed This Visit       Cardiology Problems   Essential hypertension   Relevant Orders   EKG 12-Lead   Atrial fibrillation with rapid ventricular response (HCC) - Primary   Relevant Orders   EKG 12-Lead   Acute on chronic diastolic CHF (congestive heart failure) (HCC)   Relevant Orders   EKG 12-Lead   Hypertrophic cardiomegaly   Hyperlipidemia   Relevant Orders   EKG 12-Lead   Other Visit Diagnoses     Chronic  obstructive pulmonary disease, unspecified COPD type (HCC)          Preop cardiovascular evaluation Acceptable risk for thyroid surgery No further cardiac testing needed Recommend to be moderate fluid intake perioperatively to avoid CHF exacerbation and atrial fibrillation episode Recommend she stop Eliquis 2 days prior to surgery  Paroxysmal atrial fibrillation Remote episodes of atrial fibrillation, none recently by symptoms Several episodes in 2023, March 2023 with recurrence of A-fib April 01, 2022 in the ER  continue bisoprolol, diltiazem, Eliquis Denies significant leg swelling on diltiazem.  Stop Eliquis 2 days prior to surgery  COPD with recent exacerbation Smoking cessation recommended, has plan to quit date in 1 week on chronic oxygen On inhalers, followed by pulmonary No recent COPD exacerbation  Hypertrophic cardiomyopathy Asymptomatic, continue beta-blocker, calcium channel blocker Murmur appreciated on exam from outflow tract Recommended she take beta-blocker and calcium channel blocker morning of the surgery  Leg edema Minimal swelling on today's visit   Total encounter time more than 40 minutes  Greater than 50% was spent in counseling and coordination of care with the patient    Signed, Dossie Arbour, M.D., Ph.D. [facility] [place], [place] [phone]

## 2023-06-05 ENCOUNTER — Ambulatory Visit: Payer: Medicare HMO | Attending: Cardiovascular Disease | Admitting: Cardiovascular Disease

## 2023-06-05 ENCOUNTER — Encounter: Payer: Self-pay | Admitting: Cardiovascular Disease

## 2023-06-05 VITALS — BP 120/64 | HR 102 | Ht 61.0 in | Wt 155.1 lb

## 2023-06-05 DIAGNOSIS — I517 Cardiomegaly: Secondary | ICD-10-CM

## 2023-06-05 DIAGNOSIS — I10 Essential (primary) hypertension: Secondary | ICD-10-CM

## 2023-06-05 DIAGNOSIS — E782 Mixed hyperlipidemia: Secondary | ICD-10-CM

## 2023-06-05 DIAGNOSIS — I4891 Unspecified atrial fibrillation: Secondary | ICD-10-CM

## 2023-06-05 DIAGNOSIS — I5033 Acute on chronic diastolic (congestive) heart failure: Secondary | ICD-10-CM | POA: Diagnosis not present

## 2023-06-05 DIAGNOSIS — J449 Chronic obstructive pulmonary disease, unspecified: Secondary | ICD-10-CM

## 2023-06-05 MED ORDER — POTASSIUM CHLORIDE CRYS ER 20 MEQ PO TBCR: 20.0000 meq | EXTENDED_RELEASE_TABLET | Freq: Two times a day (BID) | ORAL | 3 refills | Status: DC

## 2023-06-05 NOTE — Patient Instructions (Addendum)
Medication Instructions:  Please increase the potassium to 20 mEq twice daily  Two days prior to the procedure stop Eliquis.   If you need a refill on your cardiac medications before your next appointment, please call your pharmacy.   Lab work: No new labs needed  Testing/Procedures: No new testing needed  Follow-Up: At [facility], you and your health needs are our priority.  As part of our continuing mission to provide you with exceptional heart care, we have created designated Provider Care Teams.  These Care Teams include your primary Cardiologist (physician) and Advanced Practice Providers (APPs -  Physician Assistants and Nurse Practitioners) who all work together to provide you with the care you need, when you need it.  You will need a follow up appointment in 12 months  Providers on your designated Care Team:   Nicolasa Ducking, NP Eula Listen, PA-C Cadence Fransico Michael, [place]  COVID-19 Vaccine Information can be found at: [email or web address] For questions related to vaccine distribution or appointments, please email [email or web address] or call [phone].

## 2023-06-10 ENCOUNTER — Encounter: Payer: Self-pay | Admitting: Pulmonary Disease

## 2023-06-12 ENCOUNTER — Telehealth: Payer: Self-pay | Admitting: Cardiovascular Disease

## 2023-06-12 NOTE — Telephone Encounter (Signed)
   Pre-operative Risk Assessment    Patient Name: Stacey Sandoval  DOB: 12/03/1951 MRN: [id number]      Request for Surgical Clearance    Procedure:   total thyroidectomy, possible mini sternotomy   Date of Surgery:  Clearance TBD                                 Surgeon:  Dr Rodney Langton Surgeon's Group or Practice Name:  [facility]  Phone number:  [phone] Fax number:  [phone]   Type of Clearance Requested:   - Medical    Type of Anesthesia:  Not Indicated   Additional requests/questions:    Courtney Heys   06/12/2023, 4:27 PM

## 2023-06-12 NOTE — Telephone Encounter (Signed)
   Patient Name: Stacey Sandoval  DOB: Aug 20, 1951 MRN: [id number]  Primary Cardiologist: None  Chart reviewed as part of pre-operative protocol coverage. Given past medical history and time since last visit, based on ACC/AHA guidelines, ADRIE AUTIN is at acceptable risk for the planned procedure without further cardiovascular testing.   The patient was advised that if she develops new symptoms prior to surgery to contact our office to arrange for a follow-up visit, and she verbalized understanding.  I will route this recommendation to the requesting party via Epic fax function and remove from pre-op pool.  Please call with questions.  Napoleon Form, Leodis Rains, NP 06/12/2023, 4:31 PM

## 2023-07-21 ENCOUNTER — Telehealth: Payer: Self-pay | Admitting: Pulmonary Disease

## 2023-07-21 NOTE — Telephone Encounter (Signed)
Spoke to patient's daughter, Kimberly(DPR) and scheduled appt for 11/06/2023 at 3:30. Nothing further needed.

## 2023-07-21 NOTE — Telephone Encounter (Signed)
Pt. Called to push out apt. But was going to sched. In Nov. But wants afternoon told her we will call her back to get approval from Dr. Jayme Cloud

## 2023-07-21 NOTE — Telephone Encounter (Signed)
Spoke to patient's daughter, Stacey Sandoval(DPR). She is requesting afternoon appointment with Dr. Jayme Cloud, as patient can not do morning apartments.I offered an 11:30 appt and Stacey Sandoval declined. Dr. Jayme Cloud does not have afternoon appointments available in November. Stacey Sandoval stated that Dr. Jayme Cloud mentioned during last OV that she could see patient in the afternoon.   Dr. Jayme Cloud, please advise. Thanks

## 2023-07-21 NOTE — Telephone Encounter (Signed)
Okay to do a 3:30 or 4 PM appointment.

## 2023-08-10 ENCOUNTER — Ambulatory Visit: Payer: Medicare HMO | Admitting: Pulmonary Disease

## 2023-08-23 ENCOUNTER — Telehealth: Payer: Self-pay | Admitting: Cardiovascular Disease

## 2023-08-23 NOTE — Telephone Encounter (Signed)
Patient's daughter called stating her mother is having surgery next week Thursday and Dr. Mariah Milling is aware of that.  The anesthesiologist advised her to give [place] a called to find out when she should stop taking her Eliquis and low dose aspirin.

## 2023-08-24 ENCOUNTER — Encounter: Payer: Self-pay | Admitting: Cardiovascular Disease

## 2023-08-25 NOTE — Telephone Encounter (Signed)
Called and spoke with daughter per DPR. Informed daughter of the following from Dr. Mariah Milling.  On last clinic visit I recommended stopping Eliquis 2 days prior to thyroid surgery (in my note) We do not have aspirin on her medication list She does not need to be on aspirin with Eliquis Restart of Eliquis when cleared by surgeon Thx TGollan   Daughter verbalizes understanding.

## 2023-08-31 ENCOUNTER — Ambulatory Visit: Payer: Medicare HMO | Admitting: Pulmonary Disease

## 2023-11-06 ENCOUNTER — Ambulatory Visit: Payer: Medicare HMO | Admitting: Pulmonary Disease

## 2023-11-13 ENCOUNTER — Telehealth: Payer: Self-pay | Admitting: Cardiovascular Disease

## 2023-11-13 MED ORDER — APIXABAN 5 MG PO TABS: 5.0000 mg | ORAL_TABLET | Freq: Two times a day (BID) | ORAL | Status: DC

## 2023-11-13 NOTE — Telephone Encounter (Signed)
Patient calling the office for samples of medication:   1.  What medication and dosage are you requesting samples for? ELIQUIS 5 MG TABS tablet   2.  Are you currently out of this medication?   No, daughter states patient has enough for today and tomorrow, but she is currently in the donut hole.

## 2023-11-13 NOTE — Telephone Encounter (Signed)
Samples placed in supply closet. Patient notified that samples are available for pickup.

## 2023-12-20 ENCOUNTER — Telehealth: Payer: Self-pay | Admitting: Cardiovascular Disease

## 2023-12-20 NOTE — Telephone Encounter (Signed)
 Pt c/o medication issue:  1. Name of Medication: apixaban  (ELIQUIS ) 5 MG TABS tablet   2. How are you currently taking this medication (dosage and times per day)?   3. Are you having a reaction (difficulty breathing--STAT)?   4. What is your medication issue? Daughter called stating mother can't afford this medication anymore, copay went up this year.  She wants to know if there is another medication that can be prescribed for her.

## 2023-12-20 NOTE — Telephone Encounter (Signed)
 The daughter called stating that the patient's Eliquis  costs too much now and they cannot afford it. She checked online with assistance and she would have to spend $2000 prior to getting approved. She is unable to afford this.  The patient was calling for other options. She has two doses left.

## 2023-12-21 MED ORDER — APIXABAN 5 MG PO TABS: 5.0000 mg | ORAL_TABLET | Freq: Two times a day (BID) | ORAL | 0 refills | Status: DC

## 2023-12-21 NOTE — Telephone Encounter (Signed)
 Called daughter Cala Bradford per [place]. Notified that there are samples ready for pick up. Daughter verbalizes understanding.

## 2023-12-25 NOTE — Telephone Encounter (Signed)
 Called and spoke with daughter per DPR. Informed her of the following from Dr. Gollan.  Likely the deductible  Once they get through the 500 all deductible, insurance kicks in  This is the same for all Medicare plans  They can work with Medicare for a year-long payment plan which spreads it out over 12 months  [email or web address]  Thx  TG   Daughter verbalizes understanding. Website sent to patient via MyChart.

## 2023-12-26 ENCOUNTER — Ambulatory Visit: Payer: Medicare HMO | Admitting: Pulmonary Disease

## 2024-06-12 ENCOUNTER — Inpatient Hospital Stay: Admit: 2024-06-12

## 2024-06-12 ENCOUNTER — Other Ambulatory Visit: Payer: Self-pay

## 2024-06-12 ENCOUNTER — Inpatient Hospital Stay: Admit: 2024-06-12 | Discharge: 2024-06-12 | Disposition: A | Discharge status: Home / Self Care | Attending: Cardiology

## 2024-06-12 ENCOUNTER — Emergency Department

## 2024-06-12 ENCOUNTER — Inpatient Hospital Stay
Admission: EM | Admit: 2024-06-12 | Discharge: 2024-06-13 | DRG: 281 | Disposition: A | Discharge status: Other Acute Inpatient Hospital | Attending: Obstetrics and Gynecology | Admitting: Obstetrics and Gynecology

## 2024-06-12 ENCOUNTER — Encounter: Payer: Self-pay | Admitting: Emergency Medicine

## 2024-06-12 DIAGNOSIS — I5032 Chronic diastolic (congestive) heart failure: Secondary | ICD-10-CM | POA: Diagnosis present

## 2024-06-12 DIAGNOSIS — J9611 Chronic respiratory failure with hypoxia: Secondary | ICD-10-CM | POA: Diagnosis present

## 2024-06-12 DIAGNOSIS — Z9981 Dependence on supplemental oxygen: Secondary | ICD-10-CM | POA: Diagnosis not present

## 2024-06-12 DIAGNOSIS — Q245 Malformation of coronary vessels: Secondary | ICD-10-CM | POA: Diagnosis not present

## 2024-06-12 DIAGNOSIS — Z7901 Long term (current) use of anticoagulants: Secondary | ICD-10-CM | POA: Diagnosis not present

## 2024-06-12 DIAGNOSIS — I5030 Unspecified diastolic (congestive) heart failure: Secondary | ICD-10-CM | POA: Insufficient documentation

## 2024-06-12 DIAGNOSIS — I2510 Atherosclerotic heart disease of native coronary artery without angina pectoris: Secondary | ICD-10-CM | POA: Diagnosis present

## 2024-06-12 DIAGNOSIS — Z9049 Acquired absence of other specified parts of digestive tract: Secondary | ICD-10-CM

## 2024-06-12 DIAGNOSIS — F329 Major depressive disorder, single episode, unspecified: Secondary | ICD-10-CM | POA: Diagnosis present

## 2024-06-12 DIAGNOSIS — I10 Essential (primary) hypertension: Secondary | ICD-10-CM | POA: Diagnosis not present

## 2024-06-12 DIAGNOSIS — I480 Paroxysmal atrial fibrillation: Secondary | ICD-10-CM | POA: Diagnosis present

## 2024-06-12 DIAGNOSIS — K31811 Angiodysplasia of stomach and duodenum with bleeding: Secondary | ICD-10-CM | POA: Diagnosis not present

## 2024-06-12 DIAGNOSIS — Z7982 Long term (current) use of aspirin: Secondary | ICD-10-CM

## 2024-06-12 DIAGNOSIS — I5033 Acute on chronic diastolic (congestive) heart failure: Secondary | ICD-10-CM | POA: Diagnosis not present

## 2024-06-12 DIAGNOSIS — E785 Hyperlipidemia, unspecified: Secondary | ICD-10-CM

## 2024-06-12 DIAGNOSIS — F17200 Nicotine dependence, unspecified, uncomplicated: Secondary | ICD-10-CM

## 2024-06-12 DIAGNOSIS — E039 Hypothyroidism, unspecified: Secondary | ICD-10-CM | POA: Diagnosis present

## 2024-06-12 DIAGNOSIS — R079 Chest pain, unspecified: Secondary | ICD-10-CM

## 2024-06-12 DIAGNOSIS — I110 Hypertensive heart disease with heart failure: Secondary | ICD-10-CM | POA: Diagnosis present

## 2024-06-12 DIAGNOSIS — R031 Nonspecific low blood-pressure reading: Secondary | ICD-10-CM | POA: Diagnosis present

## 2024-06-12 DIAGNOSIS — J4489 Other specified chronic obstructive pulmonary disease: Secondary | ICD-10-CM | POA: Diagnosis present

## 2024-06-12 DIAGNOSIS — Z6831 Body mass index (BMI) 31.0-31.9, adult: Secondary | ICD-10-CM | POA: Diagnosis not present

## 2024-06-12 DIAGNOSIS — Z79899 Other long term (current) drug therapy: Secondary | ICD-10-CM | POA: Diagnosis not present

## 2024-06-12 DIAGNOSIS — I214 Non-ST elevation (NSTEMI) myocardial infarction: Principal | ICD-10-CM | POA: Diagnosis present

## 2024-06-12 DIAGNOSIS — Z8249 Family history of ischemic heart disease and other diseases of the circulatory system: Secondary | ICD-10-CM | POA: Diagnosis not present

## 2024-06-12 DIAGNOSIS — E782 Mixed hyperlipidemia: Secondary | ICD-10-CM | POA: Diagnosis not present

## 2024-06-12 DIAGNOSIS — F17210 Nicotine dependence, cigarettes, uncomplicated: Secondary | ICD-10-CM | POA: Diagnosis present

## 2024-06-12 DIAGNOSIS — Z716 Tobacco abuse counseling: Secondary | ICD-10-CM | POA: Diagnosis not present

## 2024-06-12 DIAGNOSIS — J449 Chronic obstructive pulmonary disease, unspecified: Secondary | ICD-10-CM | POA: Diagnosis not present

## 2024-06-12 DIAGNOSIS — E669 Obesity, unspecified: Secondary | ICD-10-CM | POA: Diagnosis present

## 2024-06-12 DIAGNOSIS — G629 Polyneuropathy, unspecified: Secondary | ICD-10-CM | POA: Diagnosis present

## 2024-06-12 DIAGNOSIS — Z720 Tobacco use: Secondary | ICD-10-CM | POA: Diagnosis not present

## 2024-06-12 DIAGNOSIS — I25110 Atherosclerotic heart disease of native coronary artery with unstable angina pectoris: Secondary | ICD-10-CM | POA: Diagnosis not present

## 2024-06-12 DIAGNOSIS — Z90710 Acquired absence of both cervix and uterus: Secondary | ICD-10-CM

## 2024-06-12 DIAGNOSIS — K219 Gastro-esophageal reflux disease without esophagitis: Secondary | ICD-10-CM | POA: Diagnosis present

## 2024-06-12 LAB — COMPREHENSIVE METABOLIC PANEL WITH GFR
ALT: 17 U/L (ref 0–44)
AST: 23 U/L (ref 15–41)
Albumin: 3.5 g/dL (ref 3.5–5.0)
Alkaline Phosphatase: 35 U/L — ABNORMAL LOW (ref 38–126)
Anion gap: 11 (ref 5–15)
BUN: 13 mg/dL (ref 8–23)
CO2: 30 mmol/L (ref 22–32)
Calcium: 8.2 mg/dL — ABNORMAL LOW (ref 8.9–10.3)
Chloride: 94 mmol/L — ABNORMAL LOW (ref 98–111)
Creatinine, Ser: 0.84 mg/dL (ref 0.44–1.00)
GFR, Estimated: >60 mL/min (ref >60)
Glucose, Bld: 103 mg/dL — ABNORMAL HIGH (ref 70–99)
Potassium: 3.3 mmol/L — ABNORMAL LOW (ref 3.5–5.1)
Sodium: 135 mmol/L (ref 135–145)
Total Bilirubin: 0.4 mg/dL (ref 0.0–1.2)
Total Protein: 6.3 g/dL — ABNORMAL LOW (ref 6.5–8.1)

## 2024-06-12 LAB — CBC
HCT: 40.8 % (ref 36.0–46.0)
Hemoglobin: 12.7 g/dL (ref 12.0–15.0)
MCH: 29.1 pg (ref 26.0–34.0)
MCHC: 31.1 g/dL (ref 30.0–36.0)
MCV: 93.6 fL (ref 80.0–100.0)
Platelets: 300 10*3/uL (ref 150–400)
RBC: 4.36 MIL/uL (ref 3.87–5.11)
RDW: 14.6 % (ref 11.5–15.5)
WBC: 5.5 10*3/uL (ref 4.0–10.5)
nRBC: 0 % (ref 0.0–0.2)

## 2024-06-12 LAB — ECHOCARDIOGRAM COMPLETE
AR max vel: 2.6 cm2
AV Area VTI: 2.72 cm2
AV Area mean vel: 2.94 cm2
AV Mean grad: 7 mmHg
AV Peak grad: 12.3 mmHg
Ao pk vel: 1.75 m/s
Area-P 1/2: 1.87 cm2
Height: 60 in
MV VTI: 2.02 cm2
S' Lateral: 2.5 cm
Weight: 2580.8 [oz_av]

## 2024-06-12 LAB — PROTIME-INR
INR: 1.1 (ref 0.8–1.2)
Prothrombin Time: 15.3 s — ABNORMAL HIGH (ref 11.4–15.2)

## 2024-06-12 LAB — APTT
aPTT: 32 s (ref 24–36)
aPTT: 83 s — ABNORMAL HIGH (ref 24–36)

## 2024-06-12 LAB — TROPONIN I (HIGH SENSITIVITY)
Troponin I (High Sensitivity): 25 ng/L — ABNORMAL HIGH (ref <18)
Troponin I (High Sensitivity): 479 ng/L (ref <18)

## 2024-06-12 LAB — HEPARIN LEVEL (UNFRACTIONATED): Heparin Unfractionated: >1.1 [IU]/mL — ABNORMAL HIGH (ref 0.30–0.70)

## 2024-06-12 MED ORDER — HEPARIN BOLUS VIA INFUSION
2000.0000 [IU] | Freq: Once | INTRAVENOUS | Status: AC
  Administered 2024-06-12: 2000 [IU] via INTRAVENOUS
  Filled 2024-06-12: qty 2000

## 2024-06-12 MED ORDER — ALBUTEROL SULFATE (2.5 MG/3ML) 0.083% IN NEBU: 2.5000 mg | INHALATION_SOLUTION | Freq: Four times a day (QID) | RESPIRATORY_TRACT | Status: DC | PRN

## 2024-06-12 MED ORDER — ASPIRIN 81 MG PO TBEC
81.0000 mg | DELAYED_RELEASE_TABLET | Freq: Every day | ORAL | Status: DC
  Administered 2024-06-13: 81 mg via ORAL
  Filled 2024-06-12: qty 1

## 2024-06-12 MED ORDER — ASPIRIN 325 MG PO TABS
325.0000 mg | ORAL_TABLET | Freq: Once | ORAL | Status: AC
  Administered 2024-06-12: 325 mg via ORAL
  Filled 2024-06-12: qty 1

## 2024-06-12 MED ORDER — GABAPENTIN 300 MG PO CAPS
300.0000 mg | ORAL_CAPSULE | Freq: Every morning | ORAL | Status: DC
  Administered 2024-06-13: 300 mg via ORAL
  Filled 2024-06-12: qty 1

## 2024-06-12 MED ORDER — SODIUM CHLORIDE 0.9 % IV BOLUS
500.0000 mL | Freq: Once | INTRAVENOUS | Status: AC
  Administered 2024-06-12: 500 mL via INTRAVENOUS

## 2024-06-12 MED ORDER — GABAPENTIN 300 MG PO CAPS
600.0000 mg | ORAL_CAPSULE | Freq: Every day | ORAL | Status: DC
  Administered 2024-06-12: 600 mg via ORAL
  Filled 2024-06-12: qty 2

## 2024-06-12 MED ORDER — LIDOCAINE VISCOUS HCL 2 % MT SOLN
15.0000 mL | Freq: Once | OROMUCOSAL | Status: AC
  Administered 2024-06-12: 15 mL via ORAL
  Filled 2024-06-12: qty 15

## 2024-06-12 MED ORDER — MIRTAZAPINE 15 MG PO TABS
45.0000 mg | ORAL_TABLET | Freq: Every day | ORAL | Status: DC
  Administered 2024-06-12: 45 mg via ORAL
  Filled 2024-06-12: qty 3

## 2024-06-12 MED ORDER — SODIUM CHLORIDE 0.9 % IV SOLN: INTRAVENOUS | Status: DC

## 2024-06-12 MED ORDER — HEPARIN (PORCINE) 25000 UT/250ML-% IV SOLN
800.0000 [IU]/h | INTRAVENOUS | Status: DC
  Administered 2024-06-12: 900 [IU]/h via INTRAVENOUS
  Administered 2024-06-13 (×2): 800 [IU]/h via INTRAVENOUS
  Filled 2024-06-12: qty 250

## 2024-06-12 MED ORDER — BISOPROLOL FUMARATE 5 MG PO TABS: 5.0000 mg | ORAL_TABLET | Freq: Every day | ORAL | Status: DC

## 2024-06-12 MED ORDER — IOHEXOL 350 MG/ML SOLN
75.0000 mL | Freq: Once | INTRAVENOUS | Status: AC | PRN
  Administered 2024-06-12: 75 mL via INTRAVENOUS

## 2024-06-12 MED ORDER — PANTOPRAZOLE SODIUM 40 MG PO TBEC
40.0000 mg | DELAYED_RELEASE_TABLET | Freq: Every day | ORAL | Status: DC
  Administered 2024-06-12 – 2024-06-13 (×2): 40 mg via ORAL
  Filled 2024-06-12 (×2): qty 1

## 2024-06-12 MED ORDER — ALUM & MAG HYDROXIDE-SIMETH 200-200-20 MG/5ML PO SUSP
30.0000 mL | Freq: Once | ORAL | Status: AC
  Administered 2024-06-12: 30 mL via ORAL
  Filled 2024-06-12: qty 30

## 2024-06-12 MED ORDER — BUDESON-GLYCOPYRROL-FORMOTEROL 160-9-4.8 MCG/ACT IN AERO
2.0000 | INHALATION_SPRAY | Freq: Two times a day (BID) | RESPIRATORY_TRACT | Status: DC
  Administered 2024-06-13: 2 via RESPIRATORY_TRACT
  Filled 2024-06-12: qty 5.9

## 2024-06-12 MED ORDER — ATORVASTATIN CALCIUM 20 MG PO TABS
40.0000 mg | ORAL_TABLET | Freq: Every day | ORAL | Status: DC
  Administered 2024-06-12 – 2024-06-13 (×2): 40 mg via ORAL
  Filled 2024-06-12 (×2): qty 2

## 2024-06-12 NOTE — ED Provider Notes (Signed)
 [facility] Provider Note    Event Date/Time   First MD Initiated Contact with Patient 06/12/24 [phone]     (approximate)   History   Chest Pain   HPI  Stacey Sandoval is a 73 y.o. female with a history of COPD, atrial fibrillation, CHF on Eliquis  who presents with complaints of chest pain.  Patient reports she woke up at approximately 4 AM with chest discomfort which she describes as burning and similar to indigestion but she got concerned that it could be related to her heart.  She has been out of her acid reflux medication for a week because of insurance difficulties.  She reports she is feeling better now discomfort is 2 out of 10.  No shortness of breath, occasional cough     Physical Exam   Triage Vital Signs: ED Triage Vitals  Encounter Vitals Group     BP 06/12/24 0617 106/66     Girls Systolic BP Percentile --      Girls Diastolic BP Percentile --      Boys Systolic BP Percentile --      Boys Diastolic BP Percentile --      Pulse Rate 06/12/24 0617 99     Resp 06/12/24 0617 18     Temp 06/12/24 0617 98.5 F (36.9 C)     Temp Source 06/12/24 0617 Oral     SpO2 06/12/24 0617 90 %     Weight 06/12/24 0614 73.2 kg (161 lb 4.8 oz)     Height --      Head Circumference --      Peak Flow --      Pain Score 06/12/24 0614 3     Pain Loc --      Pain Education --      Exclude from Growth Chart --     Most recent vital signs: Vitals:   06/13/24 1746 06/13/24 1958  BP: (!) 102/49 (!) 118/58  Pulse: 87 86  Resp:  17  Temp: 97.6 F (36.4 C) 97.8 F (36.6 C)  SpO2: 94% 95%     General: Awake, no distress.  CV:  Good peripheral perfusion.  Resp:  Normal effort.  Scattered mild wheezes Abd:  No distention.  Other:     ED Results / Procedures / Treatments   Labs (all labs ordered are listed, but only abnormal results are displayed) Labs Reviewed  COMPREHENSIVE METABOLIC PANEL WITH GFR - Abnormal; Notable for the following components:       Result Value   Potassium 3.3 (*)    Chloride 94 (*)    Glucose, Bld 103 (*)    Calcium  8.2 (*)    Total Protein 6.3 (*)    Alkaline Phosphatase 35 (*)    All other components within normal limits  PROTIME-INR - Abnormal; Notable for the following components:   Prothrombin Time 15.3 (*)    All other components within normal limits  HEPARIN  LEVEL (UNFRACTIONATED) - Abnormal; Notable for the following components:   Heparin  Unfractionated >1.10 (*)    All other components within normal limits  APTT - Abnormal; Notable for the following components:   aPTT 83 (*)    All other components within normal limits  HEPARIN  LEVEL (UNFRACTIONATED) - Abnormal; Notable for the following components:   Heparin  Unfractionated >1.10 (*)    All other components within normal limits  CBC - Abnormal; Notable for the following components:   Hemoglobin 11.9 (*)    MCHC  29.8 (*)    All other components within normal limits  COMPREHENSIVE METABOLIC PANEL WITH GFR - Abnormal; Notable for the following components:   Calcium  7.6 (*)    Total Protein 5.9 (*)    Albumin  3.3 (*)    All other components within normal limits  APTT - Abnormal; Notable for the following components:   aPTT 114 (*)    All other components within normal limits  TROPONIN I (HIGH SENSITIVITY) - Abnormal; Notable for the following components:   Troponin I (High Sensitivity) 25 (*)    All other components within normal limits  TROPONIN I (HIGH SENSITIVITY) - Abnormal; Notable for the following components:   Troponin I (High Sensitivity) 479 (*)    All other components within normal limits  TROPONIN I (HIGH SENSITIVITY) - Abnormal; Notable for the following components:   Troponin I (High Sensitivity) 707 (*)    All other components within normal limits  CBC  APTT  MAGNESIUM   PHOSPHORUS     EKG  ED ECG REPORT I, Lamar Price, the attending physician, personally viewed and interpreted this ECG.  Date:  06/12/2024  Rhythm: normal sinus rhythm QRS Axis: normal Intervals: Nonspecific delay ST/T Wave abnormalities: normal Narrative Interpretation: no evidence of acute ischemia    RADIOLOGY     PROCEDURES:  Critical Care performed: yes  CRITICAL CARE Performed by: Lamar Price   Total critical care time: 30 minutes  Critical care time was exclusive of separately billable procedures and treating other patients.  Critical care was necessary to treat or prevent imminent or life-threatening deterioration.  Critical care was time spent personally by me on the following activities: development of treatment plan with patient and/or surrogate as well as nursing, discussions with consultants, evaluation of patient's response to treatment, examination of patient, obtaining history from patient or surrogate, ordering and performing treatments and interventions, ordering and review of laboratory studies, ordering and review of radiographic studies, pulse oximetry and re-evaluation of patient's condition.   Procedures   MEDICATIONS ORDERED IN ED: Medications  alum & mag hydroxide-simeth (MAALOX/MYLANTA) 200-200-20 MG/5ML suspension 30 mL (30 mLs Oral Given 06/12/24 9367)    And  lidocaine  (XYLOCAINE ) 2 % viscous mouth solution 15 mL (15 mLs Oral Given 06/12/24 9367)  sodium chloride  0.9 % bolus 500 mL (0 mLs Intravenous Stopped 06/12/24 1056)  iohexol  (OMNIPAQUE ) 350 MG/ML injection 75 mL (75 mLs Intravenous Contrast Given 06/12/24 1000)  heparin  bolus via infusion 2,000 Units (2,000 Units Intravenous Bolus from Bag 06/12/24 1212)  aspirin  tablet 325 mg (325 mg Oral Given 06/12/24 1207)  magnesium  sulfate IVPB 2 g 50 mL (2 g Intravenous New Bag/Given 06/13/24 1137)  potassium chloride  SA (KLOR-CON  M) CR tablet 40 mEq (40 mEq Oral Given 06/13/24 1136)     IMPRESSION / MDM / ASSESSMENT AND PLAN / ED COURSE  I reviewed the triage vital signs and the nursing notes. Patient's presentation is most  consistent with acute presentation with potential threat to life or bodily function.  Patient presents with chest pain as detailed above, differential includes GERD, ACS, doubt pneumonia  EKG not significantly changed from prior, pending high sensitive troponin.  Chest x-ray pending  Will give Maalox/viscous lidocaine  to see if this helps her symptoms.  Have asked my colleague to follow-up on delta troponin      FINAL CLINICAL IMPRESSION(S) / ED DIAGNOSES   Final diagnoses:  NSTEMI (non-ST elevated myocardial infarction) (HCC)     Rx / DC Orders   ED  Discharge Orders          Ordered    aspirin  81 MG chewable tablet  Precath        06/13/24 1607    atorvastatin  (LIPITOR) 40 MG tablet  Daily        06/13/24 1607    heparin  25000 UT/250ML infusion  Continuous        06/13/24 1607    Increase activity slowly        06/13/24 1607    Diet - low sodium heart healthy        06/13/24 1607    AMB referral to Phase II Cardiac Rehabilitation        Pending             Note:  This document was prepared using Dragon voice recognition software and may include unintentional dictation errors.   Arlander Charleston, MD 06/15/24 [phone]

## 2024-06-12 NOTE — ED Notes (Signed)
 Pt ambulated to the bathroom and O2 sats upon return were 82% pt in NAD and denies increase in [facility] with ambulation, placed on 3L [place] and returned to 92%

## 2024-06-12 NOTE — ED Triage Notes (Addendum)
 Pt arrives via ems c/o of central CP that woke her from sleep around 0330-0400. Pt was given 324 mg ASA and 500 mL NS en route. CP initially 8/10 and upon arrival is 5/10. Pt describes pain as severe indigestion.... denies SOB. Pt reports being out of gerd medication x1week.

## 2024-06-12 NOTE — Consult Note (Signed)
 PHARMACY - ANTICOAGULATION CONSULT NOTE  Pharmacy Consult for heparin infusion Indication: chest pain/ACS  No Known Allergies  Patient Measurements: Weight: 73.2 kg (161 lb 4.8 oz)  Vital Signs: Temp: 98.5 F (36.9 C) (07/02 0617) Temp Source: Oral (07/02 0617) BP: 104/60 (07/02 0930) Pulse Rate: 71 (07/02 0930)  Labs: Recent Labs    06/12/24 0627 06/12/24 0828  HGB 12.7  --   HCT 40.8  --   PLT 300  --   CREATININE 0.84  --   TROPONINIHS 25* 479*    CrCl cannot be calculated (Unknown ideal weight.).   Medical History: Past Medical History:  Diagnosis Date   Atrial fibrillation (HCC)    CHF (congestive heart failure) (HCC)    Complication of anesthesia    VERY slow to wake after hysterectomy   COPD (chronic obstructive pulmonary disease) (HCC)    Enlarged thyroid     has been referred to [facility]   Hyperlipidemia    Hypertension    Smokers' cough (HCC)    Tobacco use    Wears dentures    Full upper and lower    Medications:  Patient takes Eliquis  5 mg po BID, patient reported to nurse last dose was 06/11/24 @ 2200  Assessment: 73 yo female presenting to ED with complaint of chest pain.  PMH includes COPD, GERD, Afib on Eliquis , and CHF.  Pharmacy consulted to initiate heparin infusion.  Baseline aPTT, HL, and PT/INR ordered  Goal of Therapy:  Heparin level 0.3-0.7 units/ml aPTT 66-102 seconds Monitor platelets by anticoagulation protocol: Yes   Plan:  Since patient on Eliquis  PTA will give half strength bolus of heparin 2000 units IV x 1 Initiate heparin infusion at 900 units/hr Check aPTT 8 hours after initiation and HL daily Adjust based on aPTT until correlation with HL Daily CBC while on heparin  Kayla JULIANNA Blew, PharmD 06/12/2024,9:39 AM

## 2024-06-12 NOTE — ED Notes (Signed)
 Pt noted to be 88% on RA. Pt placed on 2L [place] at this time.pt denies sob.

## 2024-06-12 NOTE — H&P (Signed)
 History and Physical    Patient: Stacey Sandoval FMW:[id number] DOB: [birth year] DOA: 06/12/2024 DOS: the patient was seen and examined on 06/12/2024 PCP: [facility]     Chief Complaint:  Chief Complaint  Patient presents with   Chest Pain   HPI: Stacey Sandoval is a 73 y.o. female who presents to the hospital with chest pain. Chest pain woke her from sleep at 330 AM. It is substernal and was initially constant, however, she currently does not have chest pain. It was sharp and burning. There were no aggravating or alleviating factors. Pain radiated to the L arm and into the L neck. Pain was rated 8/10. She has not had pain like this before. In the ER the pt's troponin up trended from 25 -->479 and the ER initiated a heparin drip. CTA of the chest was negative for PE. This patient will be admitted to the medicine service with cardiology consult.  Review of Systems: Review of Systems  Constitutional:  Negative for chills and fever.  HENT:  Negative for hearing loss and tinnitus.   Eyes:  Negative for discharge and redness.  Respiratory:  Negative for cough and hemoptysis.   Cardiovascular:  Positive for chest pain. Negative for leg swelling.  Gastrointestinal:  Negative for blood in stool and melena.  Genitourinary:  Negative for dysuria.  Musculoskeletal:  Negative for myalgias.  Skin:  Negative for rash.  Neurological:  Negative for dizziness and headaches.  Endo/Heme/Allergies:  Negative for polydipsia.  Psychiatric/Behavioral:  Negative for depression.     Past Medical History:  Diagnosis Date   Atrial fibrillation (HCC)    CHF (congestive heart failure) (HCC)    Complication of anesthesia    VERY slow to wake after hysterectomy   COPD (chronic obstructive pulmonary disease) (HCC)    Enlarged thyroid     has been referred to [facility]   Hyperlipidemia    Hypertension    Smokers' cough (HCC)    Tobacco use    Wears dentures    Full upper and lower   Past  Surgical History:  Procedure Laterality Date   ABDOMINAL HYSTERECTOMY  1990   BREAST LUMPECTOMY Right 1974   CATARACT EXTRACTION W/PHACO Right 06/21/2022   Procedure: CATARACT EXTRACTION PHACO AND INTRAOCULAR LENS PLACEMENT (IOC) RIGHT 9.09 00:51.1;  Surgeon: Jaye Fallow, MD;  Location: [facility];  Service: Ophthalmology;  Laterality: Right;   CATARACT EXTRACTION W/PHACO Left 07/05/2022   Procedure: CATARACT EXTRACTION PHACO AND INTRAOCULAR LENS PLACEMENT (IOC) LEFT 7.05 00:45.9;  Surgeon: Jaye Fallow, MD;  Location: [facility];  Service: Ophthalmology;  Laterality: Left;   CHOLECYSTECTOMY  2013   Social History:  reports that she has been smoking cigarettes. She has a 10 pack-year smoking history. She has never used smokeless tobacco. She reports current alcohol  use of about 7.0 standard drinks of alcohol  per week. She reports that she does not use drugs.  No Known Allergies  Family History  Problem Relation Age of Onset   Hypertension Mother    Heart disease Mother        CABG   Heart disease Father    Heart attack Father     Prior to Admission medications   Medication Sig Start Date End Date Taking? Authorizing Provider  albuterol  (PROVENTIL ) (2.5 MG/3ML) 0.083% nebulizer solution Take 3 mLs (2.5 mg total) by nebulization every 6 (six) hours as needed for wheezing or shortness of breath. 03/11/22   Awanda City, MD  apixaban  (ELIQUIS ) 5 MG  TABS tablet Take 1 tablet (5 mg total) by mouth 2 (two) times daily. 12/21/23   Gollan, Timothy J, MD  atorvastatin  (LIPITOR) 20 MG tablet Take 1 tablet (20 mg total) by mouth daily. 02/07/23   Gollan, Timothy J, MD  Biotin 10 MG CAPS  05/26/22   [provider]  bisoprolol  (ZEBETA ) 5 MG tablet TAKE 1 TABLET BY MOUTH EVERY DAY 05/02/23   Gollan, Timothy J, MD  Budeson-Glycopyrrol-Formoterol (BREZTRI  AEROSPHERE) 160-9-4.8 MCG/ACT AERO Inhale 2 puffs into the lungs in the morning and at bedtime. Patient not taking:  Reported on 06/05/2023 06/23/22   Tamea Dedra CROME, MD  Budeson-Glycopyrrol-Formoterol (BREZTRI  AEROSPHERE) 160-9-4.8 MCG/ACT AERO Inhale 2 puffs into the lungs in the morning and at bedtime. 09/01/22   Tamea Dedra CROME, MD  diltiazem  (CARDIZEM  CD) 180 MG 24 hr capsule TAKE 1 CAPSULE(180 MG) BY MOUTH DAILY 04/17/23   Gollan, Timothy J, MD  diltiazem  (CARDIZEM ) 30 MG tablet Take 1 tablet (30 mg total) by mouth as needed (atrial fibrillation). 04/18/22   Gollan, Timothy J, MD  famotidine  (PEPCID ) 40 MG tablet Take 40 mg by mouth 2 (two) times daily. 02/14/22   [provider]  furosemide  (LASIX ) 80 MG tablet Take 1 tablet (80 mg total) by mouth daily. 02/07/23   Gollan, Timothy J, MD  gabapentin  (NEURONTIN ) 300 MG capsule Take 300 mg every am & 600 in the pm 02/23/22   [provider]  mirtazapine (REMERON) 45 MG tablet Take 45 mg by mouth at bedtime. 02/01/22   [provider]  ondansetron  (ZOFRAN -ODT) 4 MG disintegrating tablet Take 4 mg by mouth every 8 (eight) hours as needed. 03/03/22   [provider]  OXYGEN  Inhale into the lungs as needed. 2-3 Liters    [provider]  potassium chloride  SA (KLOR-CON  M) 20 MEQ tablet Take 1 tablet (20 mEq total) by mouth 2 (two) times daily. 06/05/23   Perla Evalene PARAS, MD    Physical Exam: Vitals:   06/12/24 0800 06/12/24 0830 06/12/24 0900 06/12/24 0930  BP: (!) 92/54 (!) 81/51 (!) 98/52 104/60  Pulse: 75 76 70 71  Resp: (!) 23 (!) 23 (!) 22 (!) 25  Temp:      TempSrc:      SpO2: 97% 97% 99% 100%  Weight:      Height:    5' (1.524 m)   Physical Exam HENT:     Head: Normocephalic.  Cardiovascular:     Rate and Rhythm: Normal rate.  Pulmonary:     Effort: Pulmonary effort is normal.  Abdominal:     Palpations: Abdomen is soft.  Musculoskeletal:        General: Normal range of motion.  Skin:    General: Skin is warm.  Neurological:     Mental Status: She is alert and oriented to person, place, and time.   Psychiatric:        Mood and Affect: Mood normal.      Assessment and Plan: Chest pain & NSTEMI - Cardiology has been consulted 06/12/2024 - Cardiac monitoring  - ECHO  - IV heparin drip  - ASA 325 mg x 1 now  - ASA 81 mg PO daily (start 06/13/2024) - Lipitor 40 mg PO daily   HTN - Bisoprolol  5 mg PO daily ON HOLD - Hold cardizem  due to soft BP's   GERD/reflux - Protonix 40 mg PO daily   COPD - Albuterol  q6 hr PRN  - Breztri  2 puff bid  Chronic Afib - Eliquis  on hold as pt is now on heparin drip   Consults: Cardiology consulted on 06/12/2024  Family Communication: Daughter at bedside   Severity of Illness: The appropriate patient status for this patient is INPATIENT. Inpatient status is judged to be reasonable and necessary in order to provide the required intensity of service to ensure the patient's safety. The patient's presenting symptoms, physical exam findings, and initial radiographic and laboratory data in the context of their chronic comorbidities is felt to place them at high risk for further clinical deterioration. Furthermore, it is not anticipated that the patient will be medically stable for discharge from the hospital within 2 midnights of admission.   * I certify that at the point of admission it is my clinical judgment that the patient will require inpatient hospital care spanning beyond 2 midnights from the point of admission due to high intensity of service, high risk for further deterioration and high frequency of surveillance required.*  Author: Aneri Slagel , MD 06/12/2024 11:41 AM  For on call review [email or web address].

## 2024-06-12 NOTE — Consult Note (Addendum)
 Cardiology Consultation   Patient ID: Stacey Sandoval MRN: [id number]; DOB: 1951/03/13  Admit date: 06/12/2024 Date of Consult: 06/12/2024  PCP:  [facility]   [facility] Providers Cardiologist:  None        Patient Profile: Stacey Sandoval is a 73 y.o. female with a hx of paroxysmal atrial fibrillation, COPD, HFpEF, current smoker x 40+ years who is being seen 06/12/2024 for the evaluation of chest pain at the request of Dr. Luz.  History of Present Illness: Stacey Sandoval is a 73 year old female with history of hypertension, hyperlipidemia, HFpEF, paroxysmal atrial fibrillation, COPD, current smoker x 40+ years presenting with symptoms of chest pain.  Woke up this morning at about 340 with symptoms of chest pain.  Symptoms persisted prompting patient to call EMS.  Denies prior episodes.  States being compliant with medications as prescribed.  She still smokes.  Patient was very somnolent during my exam, easily arousable for history taking.  EKG on admission shows sinus rhythm, troponins were not 25, 479.  Patient started on aspirin, heparin drip per ACS protocol.  Chest CT showed no PE but three-vessel coronary calcifications noted.  She denies any chest pain symptoms upon my exam.  Echocardiogram has been ordered, currently pending.   Past Medical History:  Diagnosis Date   Atrial fibrillation (HCC)    CHF (congestive heart failure) (HCC)    Complication of anesthesia    VERY slow to wake after hysterectomy   COPD (chronic obstructive pulmonary disease) (HCC)    Enlarged thyroid     has been referred to [facility]   Hyperlipidemia    Hypertension    Smokers' cough (HCC)    Tobacco use    Wears dentures    Full upper and lower    Past Surgical History:  Procedure Laterality Date   ABDOMINAL HYSTERECTOMY  1990   BREAST LUMPECTOMY Right 1974   CATARACT EXTRACTION W/PHACO Right 06/21/2022   Procedure: CATARACT EXTRACTION PHACO AND INTRAOCULAR LENS  PLACEMENT (IOC) RIGHT 9.09 00:51.1;  Surgeon: Jaye Fallow, MD;  Location: [facility];  Service: Ophthalmology;  Laterality: Right;   CATARACT EXTRACTION W/PHACO Left 07/05/2022   Procedure: CATARACT EXTRACTION PHACO AND INTRAOCULAR LENS PLACEMENT (IOC) LEFT 7.05 00:45.9;  Surgeon: Jaye Fallow, MD;  Location: [facility];  Service: Ophthalmology;  Laterality: Left;   CHOLECYSTECTOMY  2013     Home Medications:  Prior to Admission medications   Medication Sig Start Date End Date Taking? Authorizing Provider  albuterol  (PROVENTIL ) (2.5 MG/3ML) 0.083% nebulizer solution Take 3 mLs (2.5 mg total) by nebulization every 6 (six) hours as needed for wheezing or shortness of breath. 03/11/22   Awanda City, MD  apixaban  (ELIQUIS ) 5 MG TABS tablet Take 1 tablet (5 mg total) by mouth 2 (two) times daily. 12/21/23   Gollan, Timothy J, MD  atorvastatin  (LIPITOR) 20 MG tablet Take 1 tablet (20 mg total) by mouth daily. 02/07/23   Gollan, Timothy J, MD  Biotin 10 MG CAPS  05/26/22   [provider]  bisoprolol  (ZEBETA ) 5 MG tablet TAKE 1 TABLET BY MOUTH EVERY DAY 05/02/23   Gollan, Timothy J, MD  Budeson-Glycopyrrol-Formoterol (BREZTRI  AEROSPHERE) 160-9-4.8 MCG/ACT AERO Inhale 2 puffs into the lungs in the morning and at bedtime. Patient not taking: Reported on 06/05/2023 06/23/22   Tamea Dedra CROME, MD  Budeson-Glycopyrrol-Formoterol (BREZTRI  AEROSPHERE) 160-9-4.8 MCG/ACT AERO Inhale 2 puffs into the lungs in the morning and at bedtime. 09/01/22   Tamea Dedra CROME, MD  diltiazem  (CARDIZEM  CD) 180 MG 24 hr capsule TAKE 1 CAPSULE(180 MG) BY MOUTH DAILY 04/17/23   Gollan, Timothy J, MD  diltiazem  (CARDIZEM ) 30 MG tablet Take 1 tablet (30 mg total) by mouth as needed (atrial fibrillation). 04/18/22   Gollan, Timothy J, MD  famotidine  (PEPCID ) 40 MG tablet Take 40 mg by mouth 2 (two) times daily. 02/14/22   [provider]  furosemide  (LASIX ) 80 MG tablet Take 1 tablet (80 mg total)  by mouth daily. 02/07/23   Gollan, Timothy J, MD  gabapentin  (NEURONTIN ) 300 MG capsule Take 300 mg every am & 600 in the pm 02/23/22   [provider]  mirtazapine (REMERON) 45 MG tablet Take 45 mg by mouth at bedtime. 02/01/22   [provider]  ondansetron  (ZOFRAN -ODT) 4 MG disintegrating tablet Take 4 mg by mouth every 8 (eight) hours as needed. 03/03/22   [provider]  OXYGEN  Inhale into the lungs as needed. 2-3 Liters    [provider]  potassium chloride  SA (KLOR-CON  M) 20 MEQ tablet Take 1 tablet (20 mEq total) by mouth 2 (two) times daily. 06/05/23   Gollan, Timothy J, MD    Scheduled Meds:  NOREEN ON 06/13/2024] aspirin EC  81 mg Oral Daily   atorvastatin   40 mg Oral Daily   bisoprolol   5 mg Oral Daily   budesonide -glycopyrrolate-formoterol  2 puff Inhalation BID   gabapentin   300 mg Oral q AM   gabapentin   600 mg Oral QHS   mirtazapine  45 mg Oral QHS   pantoprazole  40 mg Oral Daily   Continuous Infusions:  heparin 900 Units/hr (06/12/24 1212)   PRN Meds: albuterol   Allergies:   No Known Allergies  Social History:   Social History   Socioeconomic History   Marital status: Widowed    Spouse name: Not on file   Number of children: Not on file   Years of education: Not on file   Highest education level: Not on file  Occupational History   Not on file  Tobacco Use   Smoking status: Every Day    Current packs/day: 0.25    Average packs/day: 0.3 packs/day for 40.0 years (10.0 ttl pk-yrs)    Types: Cigarettes   Smokeless tobacco: Never   Tobacco comments:    0.5PPD 05/31/2023  Vaping Use   Vaping status: Never Used  Substance and Sexual Activity   Alcohol  use: Yes    Alcohol /week: 7.0 standard drinks of alcohol     Types: 7 Glasses of wine per week    Comment: one glass of wine daily after dinner   Drug use: Never   Sexual activity: Not Currently  Other Topics Concern   Not on file  Social History Narrative   Not on file    Social Drivers of Health   Financial Resource Strain: Low Risk  (09/01/2023)   Received from [facility]   Overall Financial Resource Strain (CARDIA)    Difficulty of Paying Living Expenses: Not hard at all  Food Insecurity: No Food Insecurity (09/01/2023)   Received from [facility]   Hunger Vital Sign    Within the past 12 months, you worried that your food would run out before you got the money to buy more.: Never true    Within the past 12 months, the food you bought just didn't last and you didn't have money to get more.: Never true  Transportation Needs: No Transportation Needs (09/01/2023)   [facility] from [facility]   PRAPARE - Transportation    Lack of Transportation (Medical): No    Lack of Transportation (Non-Medical): No  Physical Activity: Not on file  Stress: Not on file  Social Connections: Not on file  Intimate Partner Violence: Not on file    Family History:    Family History  Problem Relation Age of Onset   Hypertension Mother    Heart disease Mother        CABG   Heart disease Father    Heart attack Father      ROS:  Please see the history of present illness.   All other ROS reviewed and negative.     Physical Exam/Data: Vitals:   06/12/24 0830 06/12/24 0900 06/12/24 0930 06/12/24 1230  BP: (!) 81/51 (!) 98/52 104/60 (!) 90/49  Pulse: 76 70 71 65  Resp: (!) 23 (!) 22 (!) 25 (!) 21  Temp:      TempSrc:      SpO2: 97% 99% 100% 97%  Weight:      Height:   5' (1.524 m)    No intake or output data in the 24 hours ending 06/12/24 1303    06/12/2024    6:14 AM 06/05/2023    2:15 PM 05/31/2023    2:05 PM  Last 3 Weights  Weight (lbs) 161 lb 4.8 oz 155 lb 2 oz 155 lb 3.2 oz  Weight (kg) 73.165 kg 70.364 kg 70.398 kg     Body mass index is 31.5 kg/m.  General:  Well nourished, well developed, in no acute distress HEENT: normal Neck: no JVD Vascular: No carotid bruits; Distal pulses 2+ bilaterally Cardiac:  normal S1, S2; RRR Lungs:  Expiratory wheezing, rhonchi noted Abd: soft, nontender, no hepatomegaly  Ext: no edema Musculoskeletal:  No deformities, BUE and BLE strength normal and equal Skin: warm and dry  Neuro:  CNs 2-12 intact, no focal abnormalities noted Psych:  Normal affect   EKG:  The EKG was personally reviewed and demonstrates: Sinus rhythm Telemetry:  Telemetry was personally reviewed and demonstrates: Sinus rhythm  Relevant CV Studies: Echo 2023 EF 65%  Laboratory Data: High Sensitivity Troponin:   Recent Labs  Lab 06/12/24 0627 06/12/24 0828  TROPONINIHS 25* 479*     Chemistry Recent Labs  Lab 06/12/24 0627  NA 135  K 3.3*  CL 94*  CO2 30  GLUCOSE 103*  BUN 13  CREATININE 0.84  CALCIUM  8.2*  GFRNONAA >60  ANIONGAP 11    Recent Labs  Lab 06/12/24 0627  PROT 6.3*  ALBUMIN 3.5  AST 23  ALT 17  ALKPHOS 35*  BILITOT 0.4   Lipids No results for input(s): CHOL, TRIG, HDL, LABVLDL, LDLCALC, CHOLHDL in the last 168 hours.  Hematology Recent Labs  Lab 06/12/24 0627  WBC 5.5  RBC 4.36  HGB 12.7  HCT 40.8  MCV 93.6  MCH 29.1  MCHC 31.1  RDW 14.6  PLT 300   Thyroid  No results for input(s): TSH, FREET4 in the last 168 hours.  BNPNo results for input(s): BNP, PROBNP in the last 168 hours.  DDimer No results for input(s): DDIMER in the last 168 hours.  Radiology/Studies:  CT Angio Chest PE W and/or Wo Contrast Result Date: 06/12/2024 CLINICAL DATA:  Central chest pain, severe indigestion. EXAM: CT ANGIOGRAPHY CHEST WITH CONTRAST TECHNIQUE: Multidetector CT imaging of the chest was performed using the standard protocol during bolus administration of intravenous contrast. Multiplanar CT image reconstructions and MIPs were obtained  to evaluate the vascular anatomy. RADIATION DOSE REDUCTION: This exam was performed according to the departmental dose-optimization program which includes automated exposure control, adjustment of the mA and/or kV according to  patient size and/or use of iterative reconstruction technique. CONTRAST:  75mL OMNIPAQUE  IOHEXOL  350 MG/ML SOLN COMPARISON:  05/16/2023. FINDINGS: Cardiovascular: Negative for pulmonary embolus. Atherosclerotic calcification of the aorta, aortic valve and coronary arteries. Enlarged pulmonic trunk and heart. Ascending aorta measures 3.8 cm (coronal image 43). No pericardial effusion. Mediastinum/Nodes: Thyroidectomy. No pathologically enlarged mediastinal, hilar or axillary lymph nodes. Esophagus is grossly unremarkable. Lungs/Pleura: Image quality is degraded by expiratory phase imaging. Minimal dependent atelectasis. Scarring in the medial apex of the left upper lobe. Additional scarring in the lingula. No pleural fluid. Airway is unremarkable. Upper Abdomen: Cholecystectomy. Small hiatal hernia. Visualized portions of the liver, adrenal glands, kidneys, spleen, pancreas, stomach and bowel are otherwise grossly unremarkable. No upper abdominal adenopathy. Musculoskeletal: Degenerative changes in the spine. Review of the MIP images confirms the above findings. IMPRESSION: 1. Negative for pulmonary embolus. 2. Aortic atherosclerosis (ICD10-I70.0). Coronary artery calcification. 3. Enlarged pulmonic trunk, indicative of pulmonary arterial hypertension. Electronically Signed   By: Newell Eke M.D.   On: 06/12/2024 10:32   DG Chest Port 1 View Result Date: 06/12/2024 CLINICAL DATA:  Chest pain EXAM: PORTABLE CHEST 1 VIEW COMPARISON:  04/01/2022 FINDINGS: The cardio pericardial silhouette is enlarged. Left base collapse/consolidation noted peripherally with probable small left effusion. Right lung clear. Interstitial markings are diffusely coarsened with chronic features. No acute bony abnormality. Telemetry leads overlie the chest. IMPRESSION: Left base collapse/consolidation with probable small left effusion. Electronically Signed   By: Camellia Candle M.D.   On: 06/12/2024 07:07     Assessment and  Plan: Chest pain, NSTEMI -Currently asymptomatic. -Three-vessel coronary calcifications on chest CT.  High pretest probability. - Troponins 25, 479.  Continue to trend troponins until peak. - Aspirin 81 mg daily, heparin drip. - Obtain echocardiogram. - Plan left heart cath tomorrow.  2.  Hypertension, - BP low normal with systolic in the 90s. - Hold antihypertensive, hold beta-blocker. - Okay for levophed to keep MAP over 60 to 65%.  3.  Hyperlipidemia - Lipitor 40 mg daily.  4.  Paroxysmal atrial fibrillation - Currently in sinus rhythm - Heparin for now.  PTA Eliquis  prior to discharge.  5.  Current smoker - Smoking cessation advised.   Signed, Redell Cave, MD  06/12/2024 1:03 PM

## 2024-06-12 NOTE — Progress Notes (Signed)
*  PRELIMINARY RESULTS* Echocardiogram 2D Echocardiogram has been performed.  Stacey Sandoval 06/12/2024, 2:45 PM

## 2024-06-12 NOTE — ED Provider Notes (Signed)
 Care of this patient assumed from prior physician at 0700 pending completion of workup and disposition. Please see prior physician note for further details.  Briefly this is a 73 year old female with history of COPD, A-fib, CHF who presented with chest pain described as a burning sensation.  Does have history of reflux and ran out of her medication.  Labs pending, plan for 2 troponins, given a GI cocktail for symptomatic treatment.  9173 Initial labs with reassuring CBC, CMP without critical derangements.  Initial troponin slightly elevated at 25.  Onset of pain shortly prior to presentation, will obtain repeat.  On reassessment, patient reports complete resolution of her pain after receiving a GI cocktail.  9088 Repeat troponin unfortunately uptrending to 479.  Patient reassessed.  Remains chest pain-free.  Repeat EKG obtained which demonstrates some minor ST changes, no STEMI.  Did discuss that in the setting of her rising troponin, I do recommend admission for further evaluation.  Received aspirin with EMS.  Will start heparin drip and reach out to hospitalist team.  Case discussed with hospitalist team.  They did recommend a CTA of the chest to further evaluate which was ordered.  Hospitalist team to evaluate for anticipated admission.   Levander Slate, MD 06/12/24 1128

## 2024-06-12 NOTE — Consult Note (Signed)
 PHARMACY - ANTICOAGULATION CONSULT NOTE  Pharmacy Consult for heparin infusion Indication: chest pain/ACS  No Known Allergies  Patient Measurements: Height: 5' (152.4 cm) (from OV note) Weight: 73.2 kg (161 lb 4.8 oz) IBW/kg (Calculated) : 45.5  Vital Signs: Temp: 97.9 F (36.6 C) (07/02 1918) Temp Source: Oral (07/02 1918) BP: 101/58 (07/02 1919) Pulse Rate: 69 (07/02 1919)  Labs: Recent Labs    06/12/24 0627 06/12/24 0828 06/12/24 0931 06/12/24 1933  HGB 12.7  --   --   --   HCT 40.8  --   --   --   PLT 300  --   --   --   APTT  --   --  32 83*  LABPROT  --   --  15.3*  --   INR  --   --  1.1  --   HEPARINUNFRC  --   --  >1.10*  --   CREATININE 0.84  --   --   --   TROPONINIHS 25* 479*  --   --     Estimated Creatinine Clearance: 51.7 mL/min (by C-G formula based on SCr of 0.84 mg/dL).   Medical History: Past Medical History:  Diagnosis Date   Atrial fibrillation (HCC)    CHF (congestive heart failure) (HCC)    Complication of anesthesia    VERY slow to wake after hysterectomy   COPD (chronic obstructive pulmonary disease) (HCC)    Enlarged thyroid     has been referred to [facility]   Hyperlipidemia    Hypertension    Smokers' cough (HCC)    Tobacco use    Wears dentures    Full upper and lower    Medications:  Patient takes Eliquis  5 mg po BID, patient reported to nurse last dose was 06/11/24 @ 2200  Assessment: 73 yo female presenting to ED with complaint of chest pain.  PMH includes COPD, GERD, Afib on Eliquis , and CHF.  Pharmacy consulted to initiate heparin infusion.  Baseline aPTT, HL, and PT/INR ordered  Date Time HL/aPTT Rate/Comment 07/02 1016 >1.10 / 32 Baseline 07/02 1952 83  Therapeutic x1  Goal of Therapy:  Heparin level 0.3-0.7 units/ml aPTT 66-102 seconds Monitor platelets by anticoagulation protocol: Yes   Plan:  Continue heparin infusion at 900 units/hr Check aPTT in 8 hours to confirm Adjust based on aPTT until correlation with  HL Daily CBC while on heparin  Thank you for involving pharmacy in this patient's care.   Damien Napoleon, PharmD Clinical Pharmacist 06/12/2024 8:01 PM

## 2024-06-12 NOTE — ED Notes (Signed)
 ED Provider at bedside.

## 2024-06-13 ENCOUNTER — Encounter (HOSPITAL_COMMUNITY): Payer: Self-pay

## 2024-06-13 ENCOUNTER — Encounter
Admission: EM | Disposition: A | Discharge status: Other Acute Inpatient Hospital | Payer: Self-pay | Source: Home / Self Care | Attending: Internal Medicine

## 2024-06-13 ENCOUNTER — Inpatient Hospital Stay (HOSPITAL_COMMUNITY)
Admission: AD | Admit: 2024-06-13 | Discharge: 2024-06-21 | DRG: 321 | Disposition: A | Discharge status: Home Health | Source: Other Acute Inpatient Hospital | Attending: Cardiovascular Disease | Admitting: Cardiovascular Disease

## 2024-06-13 DIAGNOSIS — I4719 Other supraventricular tachycardia: Secondary | ICD-10-CM | POA: Diagnosis not present

## 2024-06-13 DIAGNOSIS — I480 Paroxysmal atrial fibrillation: Secondary | ICD-10-CM | POA: Diagnosis present

## 2024-06-13 DIAGNOSIS — I5032 Chronic diastolic (congestive) heart failure: Secondary | ICD-10-CM | POA: Diagnosis not present

## 2024-06-13 DIAGNOSIS — I110 Hypertensive heart disease with heart failure: Secondary | ICD-10-CM | POA: Diagnosis present

## 2024-06-13 DIAGNOSIS — Z7901 Long term (current) use of anticoagulants: Secondary | ICD-10-CM | POA: Diagnosis not present

## 2024-06-13 DIAGNOSIS — I10 Essential (primary) hypertension: Secondary | ICD-10-CM | POA: Diagnosis present

## 2024-06-13 DIAGNOSIS — Z7982 Long term (current) use of aspirin: Secondary | ICD-10-CM

## 2024-06-13 DIAGNOSIS — E876 Hypokalemia: Secondary | ICD-10-CM | POA: Diagnosis not present

## 2024-06-13 DIAGNOSIS — I5030 Unspecified diastolic (congestive) heart failure: Principal | ICD-10-CM | POA: Diagnosis present

## 2024-06-13 DIAGNOSIS — J9621 Acute and chronic respiratory failure with hypoxia: Secondary | ICD-10-CM | POA: Diagnosis present

## 2024-06-13 DIAGNOSIS — E782 Mixed hyperlipidemia: Secondary | ICD-10-CM | POA: Diagnosis present

## 2024-06-13 DIAGNOSIS — F17210 Nicotine dependence, cigarettes, uncomplicated: Secondary | ICD-10-CM | POA: Diagnosis present

## 2024-06-13 DIAGNOSIS — I25110 Atherosclerotic heart disease of native coronary artery with unstable angina pectoris: Secondary | ICD-10-CM | POA: Diagnosis not present

## 2024-06-13 DIAGNOSIS — E785 Hyperlipidemia, unspecified: Secondary | ICD-10-CM | POA: Diagnosis not present

## 2024-06-13 DIAGNOSIS — Z9981 Dependence on supplemental oxygen: Secondary | ICD-10-CM

## 2024-06-13 DIAGNOSIS — K2289 Other specified disease of esophagus: Secondary | ICD-10-CM | POA: Diagnosis not present

## 2024-06-13 DIAGNOSIS — D6832 Hemorrhagic disorder due to extrinsic circulating anticoagulants: Secondary | ICD-10-CM | POA: Diagnosis not present

## 2024-06-13 DIAGNOSIS — J449 Chronic obstructive pulmonary disease, unspecified: Secondary | ICD-10-CM | POA: Diagnosis present

## 2024-06-13 DIAGNOSIS — Z8249 Family history of ischemic heart disease and other diseases of the circulatory system: Secondary | ICD-10-CM | POA: Diagnosis not present

## 2024-06-13 DIAGNOSIS — Z7902 Long term (current) use of antithrombotics/antiplatelets: Secondary | ICD-10-CM | POA: Diagnosis not present

## 2024-06-13 DIAGNOSIS — Z720 Tobacco use: Secondary | ICD-10-CM | POA: Diagnosis not present

## 2024-06-13 DIAGNOSIS — I214 Non-ST elevation (NSTEMI) myocardial infarction: Secondary | ICD-10-CM | POA: Diagnosis present

## 2024-06-13 DIAGNOSIS — I5033 Acute on chronic diastolic (congestive) heart failure: Secondary | ICD-10-CM | POA: Diagnosis not present

## 2024-06-13 DIAGNOSIS — Z79899 Other long term (current) drug therapy: Secondary | ICD-10-CM

## 2024-06-13 DIAGNOSIS — Z79890 Hormone replacement therapy (postmenopausal): Secondary | ICD-10-CM

## 2024-06-13 DIAGNOSIS — I2510 Atherosclerotic heart disease of native coronary artery without angina pectoris: Secondary | ICD-10-CM | POA: Diagnosis present

## 2024-06-13 DIAGNOSIS — I080 Rheumatic disorders of both mitral and aortic valves: Secondary | ICD-10-CM | POA: Diagnosis present

## 2024-06-13 DIAGNOSIS — K921 Melena: Secondary | ICD-10-CM | POA: Diagnosis not present

## 2024-06-13 DIAGNOSIS — Z90710 Acquired absence of both cervix and uterus: Secondary | ICD-10-CM

## 2024-06-13 DIAGNOSIS — Z955 Presence of coronary angioplasty implant and graft: Secondary | ICD-10-CM

## 2024-06-13 DIAGNOSIS — K31811 Angiodysplasia of stomach and duodenum with bleeding: Secondary | ICD-10-CM | POA: Diagnosis not present

## 2024-06-13 DIAGNOSIS — Z7951 Long term (current) use of inhaled steroids: Secondary | ICD-10-CM

## 2024-06-13 DIAGNOSIS — Z9049 Acquired absence of other specified parts of digestive tract: Secondary | ICD-10-CM | POA: Diagnosis not present

## 2024-06-13 DIAGNOSIS — K5521 Angiodysplasia of colon with hemorrhage: Secondary | ICD-10-CM

## 2024-06-13 DIAGNOSIS — D62 Acute posthemorrhagic anemia: Secondary | ICD-10-CM | POA: Diagnosis not present

## 2024-06-13 HISTORY — PX: LEFT HEART CATH AND CORONARY ANGIOGRAPHY: CATH118249

## 2024-06-13 LAB — CBC
HCT: 39.9 % (ref 36.0–46.0)
Hemoglobin: 11.9 g/dL — ABNORMAL LOW (ref 12.0–15.0)
MCH: 28.7 pg (ref 26.0–34.0)
MCHC: 29.8 g/dL — ABNORMAL LOW (ref 30.0–36.0)
MCV: 96.4 fL (ref 80.0–100.0)
Platelets: 264 10*3/uL (ref 150–400)
RBC: 4.14 MIL/uL (ref 3.87–5.11)
RDW: 14.4 % (ref 11.5–15.5)
WBC: 6.3 10*3/uL (ref 4.0–10.5)
nRBC: 0 % (ref 0.0–0.2)

## 2024-06-13 LAB — COMPREHENSIVE METABOLIC PANEL WITH GFR
ALT: 15 U/L (ref 0–44)
AST: 28 U/L (ref 15–41)
Albumin: 3.3 g/dL — ABNORMAL LOW (ref 3.5–5.0)
Alkaline Phosphatase: 39 U/L (ref 38–126)
Anion gap: 6 (ref 5–15)
BUN: 8 mg/dL (ref 8–23)
CO2: 32 mmol/L (ref 22–32)
Calcium: 7.6 mg/dL — ABNORMAL LOW (ref 8.9–10.3)
Chloride: 100 mmol/L (ref 98–111)
Creatinine, Ser: 0.62 mg/dL (ref 0.44–1.00)
GFR, Estimated: >60 mL/min (ref >60)
Glucose, Bld: 79 mg/dL (ref 70–99)
Potassium: 3.5 mmol/L (ref 3.5–5.1)
Sodium: 138 mmol/L (ref 135–145)
Total Bilirubin: 0.4 mg/dL (ref 0.0–1.2)
Total Protein: 5.9 g/dL — ABNORMAL LOW (ref 6.5–8.1)

## 2024-06-13 LAB — MAGNESIUM: Magnesium: 1.7 mg/dL (ref 1.7–2.4)

## 2024-06-13 LAB — APTT: aPTT: 114 s — ABNORMAL HIGH (ref 24–36)

## 2024-06-13 LAB — PHOSPHORUS: Phosphorus: 3.2 mg/dL (ref 2.5–4.6)

## 2024-06-13 LAB — TROPONIN I (HIGH SENSITIVITY): Troponin I (High Sensitivity): 707 ng/L (ref <18)

## 2024-06-13 LAB — HEPARIN LEVEL (UNFRACTIONATED): Heparin Unfractionated: >1.1 [IU]/mL — ABNORMAL HIGH (ref 0.30–0.70)

## 2024-06-13 SURGERY — LEFT HEART CATH AND CORONARY ANGIOGRAPHY
Anesthesia: Moderate Sedation

## 2024-06-13 MED ORDER — BUDESON-GLYCOPYRROL-FORMOTEROL 160-9-4.8 MCG/ACT IN AERO
2.0000 | INHALATION_SPRAY | Freq: Two times a day (BID) | RESPIRATORY_TRACT | Status: DC
  Administered 2024-06-13 – 2024-06-21 (×16): 2 via RESPIRATORY_TRACT
  Filled 2024-06-13: qty 5.9

## 2024-06-13 MED ORDER — HEPARIN (PORCINE) IN NACL 1000-0.9 UT/500ML-% IV SOLN
INTRAVENOUS | Status: AC
Start: 2024-06-13 — End: 2024-06-13
  Filled 2024-06-13: qty 1000

## 2024-06-13 MED ORDER — HEPARIN (PORCINE) 25000 UT/250ML-% IV SOLN: 800.0000 [IU]/h | INTRAVENOUS | Status: DC

## 2024-06-13 MED ORDER — HEPARIN (PORCINE) 25000 UT/250ML-% IV SOLN
1100.0000 [IU]/h | INTRAVENOUS | Status: DC
  Administered 2024-06-14: 650 [IU]/h via INTRAVENOUS
  Administered 2024-06-16: 700 [IU]/h via INTRAVENOUS
  Administered 2024-06-17: 900 [IU]/h via INTRAVENOUS
  Filled 2024-06-13 (×3): qty 250

## 2024-06-13 MED ORDER — PANTOPRAZOLE SODIUM 40 MG PO TBEC
40.0000 mg | DELAYED_RELEASE_TABLET | Freq: Every day | ORAL | Status: DC
  Administered 2024-06-14 – 2024-06-18 (×5): 40 mg via ORAL
  Filled 2024-06-13 (×5): qty 1

## 2024-06-13 MED ORDER — CLOPIDOGREL BISULFATE 75 MG PO TABS: 300.0000 mg | ORAL_TABLET | Freq: Once | ORAL | Status: DC

## 2024-06-13 MED ORDER — VERAPAMIL HCL 2.5 MG/ML IV SOLN
INTRAVENOUS | Status: DC | PRN
  Administered 2024-06-13: 2.5 mg via INTRAVENOUS

## 2024-06-13 MED ORDER — LIDOCAINE HCL 1 % IJ SOLN
INTRAMUSCULAR | Status: AC
Start: 2024-06-13 — End: 2024-06-13
  Filled 2024-06-13: qty 20

## 2024-06-13 MED ORDER — LIDOCAINE HCL (PF) 1 % IJ SOLN
INTRAMUSCULAR | Status: DC | PRN
  Administered 2024-06-13: 2 mL

## 2024-06-13 MED ORDER — ASPIRIN 81 MG PO TBEC
81.0000 mg | DELAYED_RELEASE_TABLET | Freq: Every day | ORAL | Status: DC
  Administered 2024-06-14 – 2024-06-21 (×7): 81 mg via ORAL
  Filled 2024-06-13 (×8): qty 1

## 2024-06-13 MED ORDER — VERAPAMIL HCL 2.5 MG/ML IV SOLN
INTRAVENOUS | Status: AC
  Filled 2024-06-13: qty 2

## 2024-06-13 MED ORDER — BISOPROLOL FUMARATE 5 MG PO TABS
5.0000 mg | ORAL_TABLET | Freq: Every day | ORAL | Status: DC
  Administered 2024-06-13: 5 mg via ORAL
  Filled 2024-06-13: qty 1

## 2024-06-13 MED ORDER — MIDAZOLAM HCL 2 MG/2ML IJ SOLN
INTRAMUSCULAR | Status: DC | PRN
  Administered 2024-06-13: .5 mg via INTRAVENOUS

## 2024-06-13 MED ORDER — GABAPENTIN 300 MG PO CAPS
600.0000 mg | ORAL_CAPSULE | Freq: Every day | ORAL | Status: DC
  Administered 2024-06-14 – 2024-06-20 (×7): 600 mg via ORAL
  Filled 2024-06-13 (×7): qty 2

## 2024-06-13 MED ORDER — GABAPENTIN 300 MG PO CAPS
300.0000 mg | ORAL_CAPSULE | Freq: Every morning | ORAL | Status: DC
  Administered 2024-06-14 – 2024-06-21 (×8): 300 mg via ORAL
  Filled 2024-06-13 (×2): qty 1
  Filled 2024-06-13: qty 3
  Filled 2024-06-13 (×5): qty 1

## 2024-06-13 MED ORDER — FENTANYL CITRATE (PF) 100 MCG/2ML IJ SOLN
INTRAMUSCULAR | Status: AC
  Filled 2024-06-13: qty 2

## 2024-06-13 MED ORDER — MIDAZOLAM HCL 2 MG/2ML IJ SOLN
INTRAMUSCULAR | Status: AC
  Filled 2024-06-13: qty 2

## 2024-06-13 MED ORDER — ATORVASTATIN CALCIUM 40 MG PO TABS
40.0000 mg | ORAL_TABLET | Freq: Every day | ORAL | Status: DC
  Administered 2024-06-14 – 2024-06-21 (×8): 40 mg via ORAL
  Filled 2024-06-13 (×8): qty 1

## 2024-06-13 MED ORDER — ASPIRIN 81 MG PO CHEW: 81.0000 mg | CHEWABLE_TABLET | ORAL | Status: DC

## 2024-06-13 MED ORDER — HEPARIN SODIUM (PORCINE) 1000 UNIT/ML IJ SOLN
INTRAMUSCULAR | Status: AC
  Filled 2024-06-13: qty 10

## 2024-06-13 MED ORDER — NITROGLYCERIN 0.4 MG SL SUBL
0.4000 mg | SUBLINGUAL_TABLET | SUBLINGUAL | Status: DC | PRN
  Administered 2024-06-16: 0.4 mg via SUBLINGUAL
  Filled 2024-06-13: qty 1

## 2024-06-13 MED ORDER — CLOPIDOGREL BISULFATE 75 MG PO TABS
75.0000 mg | ORAL_TABLET | Freq: Every day | ORAL | Status: DC
  Administered 2024-06-15 – 2024-06-21 (×7): 75 mg via ORAL
  Filled 2024-06-13 (×7): qty 1

## 2024-06-13 MED ORDER — SODIUM CHLORIDE 0.9 % IV SOLN: INTRAVENOUS | Status: DC

## 2024-06-13 MED ORDER — IOHEXOL 300 MG/ML  SOLN
INTRAMUSCULAR | Status: DC | PRN
  Administered 2024-06-13: 38 mL

## 2024-06-13 MED ORDER — CLOPIDOGREL BISULFATE 300 MG PO TABS
300.0000 mg | ORAL_TABLET | Freq: Once | ORAL | Status: AC
  Administered 2024-06-14: 300 mg via ORAL
  Filled 2024-06-13: qty 1

## 2024-06-13 MED ORDER — ONDANSETRON HCL 4 MG/2ML IJ SOLN: 4.0000 mg | Freq: Four times a day (QID) | INTRAMUSCULAR | Status: DC | PRN

## 2024-06-13 MED ORDER — ATORVASTATIN CALCIUM 40 MG PO TABS: 40.0000 mg | ORAL_TABLET | Freq: Every day | ORAL | Status: AC

## 2024-06-13 MED ORDER — FENTANYL CITRATE (PF) 100 MCG/2ML IJ SOLN
INTRAMUSCULAR | Status: DC | PRN
  Administered 2024-06-13: 25 ug via INTRAVENOUS

## 2024-06-13 MED ORDER — HEPARIN SODIUM (PORCINE) 1000 UNIT/ML IJ SOLN
INTRAMUSCULAR | Status: DC | PRN
  Administered 2024-06-13: 3500 [IU] via INTRAVENOUS

## 2024-06-13 MED ORDER — HEPARIN (PORCINE) 25000 UT/250ML-% IV SOLN
INTRAVENOUS | Status: AC
Start: 2024-06-13 — End: 2024-06-13
  Filled 2024-06-13: qty 250

## 2024-06-13 MED ORDER — TICAGRELOR 90 MG PO TABS: 90.0000 mg | ORAL_TABLET | Freq: Two times a day (BID) | ORAL | Status: DC

## 2024-06-13 MED ORDER — ACETAMINOPHEN 325 MG PO TABS
650.0000 mg | ORAL_TABLET | ORAL | Status: DC | PRN
  Administered 2024-06-18: 650 mg via ORAL
  Filled 2024-06-13: qty 2

## 2024-06-13 MED ORDER — HEPARIN (PORCINE) IN NACL 1000-0.9 UT/500ML-% IV SOLN
INTRAVENOUS | Status: DC | PRN
  Administered 2024-06-13: 500 mL

## 2024-06-13 MED ORDER — MAGNESIUM SULFATE 2 GM/50ML IV SOLN
2.0000 g | Freq: Once | INTRAVENOUS | Status: AC
  Administered 2024-06-13: 2 g via INTRAVENOUS
  Filled 2024-06-13: qty 50

## 2024-06-13 MED ORDER — MIRTAZAPINE 30 MG PO TBDP
45.0000 mg | ORAL_TABLET | Freq: Every day | ORAL | Status: DC
  Administered 2024-06-13 – 2024-06-20 (×8): 45 mg via ORAL
  Filled 2024-06-13 (×9): qty 1

## 2024-06-13 MED ORDER — BISOPROLOL FUMARATE 5 MG PO TABS
5.0000 mg | ORAL_TABLET | Freq: Every day | ORAL | Status: DC
  Administered 2024-06-14 – 2024-06-21 (×8): 5 mg via ORAL
  Filled 2024-06-13 (×8): qty 1

## 2024-06-13 MED ORDER — POTASSIUM CHLORIDE CRYS ER 20 MEQ PO TBCR
40.0000 meq | EXTENDED_RELEASE_TABLET | Freq: Once | ORAL | Status: AC
  Administered 2024-06-13: 40 meq via ORAL
  Filled 2024-06-13: qty 2

## 2024-06-13 SURGICAL SUPPLY — 9 items
CATH INFINITI AMBI 5FR TG (CATHETERS) IMPLANT
CATH INFINITI JR4 5F (CATHETERS) IMPLANT
DEVICE RAD TR BAND REGULAR (VASCULAR PRODUCTS) IMPLANT
DRAPE BRACHIAL (DRAPES) IMPLANT
GLIDESHEATH SLEND SS 6F .021 (SHEATH) IMPLANT
GUIDEWIRE INQWIRE 1.5J.035X260 (WIRE) IMPLANT
PACK CARDIAC CATH (CUSTOM PROCEDURE TRAY) ×1 IMPLANT
SET ATX-X65L (MISCELLANEOUS) IMPLANT
STATION PROTECTION PRESSURIZED (MISCELLANEOUS) IMPLANT

## 2024-06-13 NOTE — H&P (Signed)
 Cardiology Admission History and Physical   Patient ID: Stacey Sandoval MRN: [id number]; DOB: 21-May-1951   Admission date: (Not on file)  PCP:  [facility] Providers Cardiologist:  None       Chief Complaint: NSTEMI  Patient Profile: Stacey Sandoval is a 73 y.o. female with a past medical history of paroxysmal atrial fibrillation, COPD, chronic HFpEF, current smoker x 40+ years, who is being seen 06/13/2024 for the evaluation of a recent NSTEMI.  History of Present Illness: Stacey Sandoval presented to the [facility] emergency department on 06/12/2024 with complaints of chest pain.  Symptoms persisted prompting call to EMS.  She denied prior episodes.  States that she has been compliant with her medications as prescribed.  Unfortunately she continues to smoke.  In the emergency department EKG revealed sinus rhythm.  High-sensitivity troponins trended 25 and 479.  She was started on aspirin and heparin drip per ACS protocol.  Chest CT showed no PE but three-vessel coronary calcifications were noted.  She was scheduled for an echocardiogram and remained n.p.o. for left heart catheterization today.  Echocardiogram revealed an LVEF of 60 to 65%, no RWMA, mild LVH, G2 DD, right ventricular systolic function is normal with normal cavity size is, mild to moderately dilated left atrium, mild mitral regurgitation, and aortic valve sclerosis without evidence of stenosis.  Left heart catheterization revealed proximal RCA lesion 95% stenosis, heavily calcified focal lesion, proximal RCA to mid RCA lesion 85% stenosis with 95% stenosis sidebranch in acute margin.  Moderately calcified involving a large RV marginal branch, mid RCA lesion 50% stenosis, proximal circumflex lesion 70% stenosis focal at this point will consider medical management pending completion of RCA intervention, mid LAD and distal LAD lesion 30% stenosis, and LV end-diastolic pressure was normal.  There  was no aortic valve stenosis.  Recommendation was to load with antiplatelets therapy restart IV heparin 2 hours post TR band removal.  It was recommended patient be transferred to [facility] with plans for arthrectomy based PCI to the RCA on Monday of 06/17/2024.  Continue with dual antiplatelet therapy with aspirin 81 mg daily and Brilinta 90 mg twice daily beyond 12 months.  Patient was considered stable with no immediate postprocedure complications.    Past Medical History:  Diagnosis Date   Atrial fibrillation (HCC)    CHF (congestive heart failure) (HCC)    Complication of anesthesia    VERY slow to wake after hysterectomy   COPD (chronic obstructive pulmonary disease) (HCC)    Enlarged thyroid     has been referred to [facility]   Hyperlipidemia    Hypertension    Smokers' cough (HCC)    Tobacco use    Wears dentures    Full upper and lower   Past Surgical History:  Procedure Laterality Date   ABDOMINAL HYSTERECTOMY  1990   BREAST LUMPECTOMY Right 1974   CATARACT EXTRACTION W/PHACO Right 06/21/2022   Procedure: CATARACT EXTRACTION PHACO AND INTRAOCULAR LENS PLACEMENT (IOC) RIGHT 9.09 00:51.1;  Surgeon: Jaye Fallow, MD;  Location: [facility];  Service: Ophthalmology;  Laterality: Right;   CATARACT EXTRACTION W/PHACO Left 07/05/2022   Procedure: CATARACT EXTRACTION PHACO AND INTRAOCULAR LENS PLACEMENT (IOC) LEFT 7.05 00:45.9;  Surgeon: Jaye Fallow, MD;  Location: [facility];  Service: Ophthalmology;  Laterality: Left;   CHOLECYSTECTOMY  2013     Medications Prior to Admission: Prior to Admission medications   Medication Sig Start Date End Date Taking? Authorizing  Provider  acetaminophen  (TYLENOL ) 650 MG CR tablet Take 1,300 mg by mouth 2 (two) times daily.    [provider]  albuterol  (PROVENTIL ) (2.5 MG/3ML) 0.083% nebulizer solution Take 3 mLs (2.5 mg total) by nebulization every 6 (six) hours as needed for wheezing or shortness of breath.  03/11/22   Awanda City, MD  apixaban  (ELIQUIS ) 5 MG TABS tablet Take 1 tablet (5 mg total) by mouth 2 (two) times daily. 12/21/23   Gollan, Timothy J, MD  aspirin 81 MG chewable tablet Chew 1 tablet (81 mg total) by mouth before cath procedure. 06/14/24   Wouk, Devaughn Sayres, MD  atorvastatin  (LIPITOR) 20 MG tablet Take 1 tablet (20 mg total) by mouth daily. 02/07/23   Gollan, Timothy J, MD  atorvastatin  (LIPITOR) 40 MG tablet Take 1 tablet (40 mg total) by mouth daily. 06/14/24   Wouk, Devaughn Sayres, MD  Biotin 10 MG CAPS  05/26/22   [provider]  bisoprolol  (ZEBETA ) 5 MG tablet TAKE 1 TABLET BY MOUTH EVERY DAY Patient taking differently: Take 5 mg by mouth 2 (two) times daily. 05/02/23   Gollan, Timothy J, MD  Budeson-Glycopyrrol-Formoterol (BREZTRI  AEROSPHERE) 160-9-4.8 MCG/ACT AERO Inhale 2 puffs into the lungs in the morning and at bedtime. Patient not taking: Reported on 06/05/2023 06/23/22   Tamea Dedra CROME, MD  Budeson-Glycopyrrol-Formoterol (BREZTRI  AEROSPHERE) 160-9-4.8 MCG/ACT AERO Inhale 2 puffs into the lungs in the morning and at bedtime. 09/01/22   Tamea Dedra CROME, MD  calcitRIOL (ROCALTROL) 0.25 MCG capsule Take 0.25 mcg by mouth 2 (two) times daily. 09/04/23   [provider]  diltiazem  (CARDIZEM  CD) 180 MG 24 hr capsule TAKE 1 CAPSULE(180 MG) BY MOUTH DAILY 04/17/23   Gollan, Timothy J, MD  diltiazem  (CARDIZEM ) 30 MG tablet Take 1 tablet (30 mg total) by mouth as needed (atrial fibrillation). 04/18/22   Gollan, Timothy J, MD  famotidine  (PEPCID ) 40 MG tablet Take 40 mg by mouth 2 (two) times daily. 02/14/22   [provider]  furosemide  (LASIX ) 80 MG tablet Take 1 tablet (80 mg total) by mouth daily. 02/07/23   Gollan, Timothy J, MD  gabapentin  (NEURONTIN ) 300 MG capsule Take 300 mg every am & 600 in the pm 02/23/22   [provider]  heparin 25000 UT/250ML infusion Inject 800 Units/hr into the vein continuous. 06/13/24   Wouk, Devaughn Sayres, MD  levothyroxine  (SYNTHROID) 100 MCG tablet Take 100 mcg by mouth daily before breakfast.    [provider]  mirtazapine (REMERON) 45 MG tablet Take 45 mg by mouth at bedtime. 02/01/22   [provider]  OXYGEN  Inhale into the lungs as needed. 2-3 Liters    [provider]  potassium chloride  SA (KLOR-CON  M) 20 MEQ tablet Take 1 tablet (20 mEq total) by mouth 2 (two) times daily. 06/05/23   Gollan, Timothy J, MD  triamcinolone ointment (KENALOG) 0.5 % Apply 1 Application topically 2 (two) times daily. 05/20/24   [provider]     Allergies:   No Known Allergies  Social History:   Social History   Socioeconomic History   Marital status: Widowed    Spouse name: Not on file   Number of children: Not on file   Years of education: Not on file   Highest education level: Not on file  Occupational History   Not on file  Tobacco Use   Smoking status: Every Day    Current packs/day: 0.25    Average packs/day: 0.3 packs/day for  40.0 years (10.0 ttl pk-yrs)    Types: Cigarettes   Smokeless tobacco: Never   Tobacco comments:    0.5PPD 05/31/2023  Vaping Use   Vaping status: Never Used  Substance and Sexual Activity   Alcohol  use: Yes    Alcohol /week: 7.0 standard drinks of alcohol     Types: 7 Glasses of wine per week    Comment: one glass of wine daily after dinner   Drug use: Never   Sexual activity: Not Currently  Other Topics Concern   Not on file  Social History Narrative   Not on file   Social Drivers of Health   Financial Resource Strain: Low Risk  (09/01/2023)   Received from [facility]   Overall Financial Resource Strain (CARDIA)    Difficulty of Paying Living Expenses: Not hard at all  Food Insecurity: No Food Insecurity (06/12/2024)   Hunger Vital Sign    Worried About Running Out of Food in the Last Year: Never true    Ran Out of Food in the Last Year: Never true  Transportation Needs: No Transportation Needs (06/12/2024)   PRAPARE - Therapist, art (Medical): No    Lack of Transportation (Non-Medical): No  Physical Activity: Not on file  Stress: Not on file  Social Connections: Moderately Isolated (06/12/2024)   Social Connection and Isolation Panel    Frequency of Communication with Friends and Family: More than three times a week    Frequency of Social Gatherings with Friends and Family: More than three times a week    Attends Religious Services: More than 4 times per year    Active Member of [employer] or Organizations: No    Attends Banker Meetings: Never    Marital Status: Widowed  Intimate Partner Violence: Not At Risk (06/12/2024)   Humiliation, Afraid, Rape, and Kick questionnaire    Fear of Current or Ex-Partner: No    Emotionally Abused: No    Physically Abused: No    Sexually Abused: No     Family History:   The patient's family history includes Heart attack in her father; Heart disease in her father and mother; Hypertension in her mother.    ROS:  Please see the history of present illness.  Review of Systems  Constitutional:  Positive for malaise/fatigue.   All other ROS reviewed and negative.     Physical Exam/Data: There were no vitals filed for this visit. No intake or output data in the 24 hours ending 06/13/24 1632    06/12/2024    6:14 AM 06/05/2023    2:15 PM 05/31/2023    2:05 PM  Last 3 Weights  Weight (lbs) 161 lb 4.8 oz 155 lb 2 oz 155 lb 3.2 oz  Weight (kg) 73.165 kg 70.364 kg 70.398 kg     There is no height or weight on file to calculate BMI.  General:  Well nourished, well developed, in no acute distress HEENT: normal Neck: no JVD Vascular: No carotid bruits; Distal pulses 2+ bilaterally   Cardiac:  normal S1, S2; RRR; no murmur , right radial cath site with TR band in place, no bleeding, hematoma, or bruising Lungs:  clear to auscultation bilaterally, no wheezing, rhonchi or rales  Abd: soft, nontender, no hepatomegaly  Ext: no edema Musculoskeletal:  No  deformities, BUE and BLE strength normal and equal Skin: warm and dry  Neuro:  CNs 2-12 intact, no focal abnormalities noted Psych:  Normal affect  EKG:  The ECG that was done 06/12/2024 was personally reviewed and demonstrates sinus rhythm with a rate of 72 with an IVCD and ST depression noted in lateral leads  Relevant CV Studies:  LHC 06/13/2024 Prox RCA lesion is 95% stenosed.  Heavily calcified focal lesion   Prox RCA to Mid RCA lesion is 85% stenosed with 90% stenosed side branch in Acute Mrg.  Moderately calcified involving large RV marginal branch.   Mid RCA lesion is 50% stenosed.   Prox Cx lesion is 70% stenosed.  (Focal) at this point would consider medical management pending completion of the RCA intervention   Mid LAD to Dist LAD lesion is 30% stenosed.   LV end diastolic pressure is normal. There is no aortic valve stenosis.   Dominance: Right body  RECOMMENDATIONS   Plan will be to load with antiplatelet agent (likely Brilinta), restart IV heparin 2 hours post TR band removal.   Will arrange transfer to [facility] with plans for atherectomy based PCI to the RCA on Monday, 06/17/2024   Recommend dual antiplatelet therapy with Aspirin 81mg  daily and Ticagrelor 90mg  twice daily long-term (beyond 12 months) because of Will likely require long stent in the RCA.  2D echo 06/12/2024 1. Left ventricular ejection fraction, by estimation, is 60 to 65%. The  left ventricle has normal function. The left ventricle has no regional  wall motion abnormalities. There is mild left ventricular hypertrophy of  the basal-septal segment. Left  ventricular diastolic parameters are consistent with Grade II diastolic  dysfunction (pseudonormalization).   2. Right ventricular systolic function is normal. The right ventricular  size is normal.   3. Left atrial size was mild to moderately dilated.   4. The mitral valve is normal in structure. Mild mitral valve  regurgitation.   5. The aortic  valve was not well visualized. Aortic valve regurgitation  is not visualized. Aortic valve sclerosis is present, with no evidence of  aortic valve stenosis. Aortic valve mean gradient measures 7.0 mmHg.   Laboratory Data: High Sensitivity Troponin:   Recent Labs  Lab 06/12/24 0627 06/12/24 0828 06/13/24 0458  TROPONINIHS 25* 479* 707*      Chemistry Recent Labs  Lab 06/12/24 0627 06/13/24 0458  NA 135 138  K 3.3* 3.5  CL 94* 100  CO2 30 32  GLUCOSE 103* 79  BUN 13 8  CREATININE 0.84 0.62  CALCIUM  8.2* 7.6*  MG  --  1.7  GFRNONAA >60 >60  ANIONGAP 11 6    Recent Labs  Lab 06/12/24 0627 06/13/24 0458  PROT 6.3* 5.9*  ALBUMIN 3.5 3.3*  AST 23 28  ALT 17 15  ALKPHOS 35* 39  BILITOT 0.4 0.4   Lipids No results for input(s): CHOL, TRIG, HDL, LABVLDL, LDLCALC, CHOLHDL in the last 168 hours. Hematology Recent Labs  Lab 06/12/24 0627 06/13/24 0458  WBC 5.5 6.3  RBC 4.36 4.14  HGB 12.7 11.9*  HCT 40.8 39.9  MCV 93.6 96.4  MCH 29.1 28.7  MCHC 31.1 29.8*  RDW 14.6 14.4  PLT 300 264   Thyroid  No results for input(s): TSH, FREET4 in the last 168 hours. BNPNo results for input(s): BNP, PROBNP in the last 168 hours.  DDimer No results for input(s): DDIMER in the last 168 hours.  Radiology/Studies:  CARDIAC CATHETERIZATION Result Date: 06/13/2024 Images from the original result were not included.   Prox RCA lesion is 95% stenosed.  Heavily calcified focal lesion   Prox RCA to Mid  RCA lesion is 85% stenosed with 90% stenosed side branch in Acute Mrg.  Moderately calcified involving large RV marginal branch.   Mid RCA lesion is 50% stenosed.   Prox Cx lesion is 70% stenosed.  (Focal) at this point would consider medical management pending completion of the RCA intervention   Mid LAD to Dist LAD lesion is 30% stenosed.   LV end diastolic pressure is normal. There is no aortic valve stenosis. Dominance: Right body RECOMMENDATIONS   Plan will be to load  with antiplatelet agent (likely Brilinta), restart IV heparin 2 hours post TR band removal.   Will arrange [facility] with plans for atherectomy based PCI to the RCA on Monday, 06/17/2024   Recommend dual antiplatelet therapy with Aspirin 81mg  daily and Ticagrelor 90mg  twice daily long-term (beyond 12 months) because of Will likely require long stent in the RCA. Alm Clay, MD, MS Alm Clay, M.D., M.S. Interventional Cardiologist [facility] Pager # [phone]     Assessment and Plan: NSTEMI/coronary artery disease -Admitted to the hospital gelastic with chest pain, elevated troponin started on aspirin and heparin per ACS protocol -Eliquis  placed on hold in preparation for cardiac catheterization  -High-sensitivity troponin peaked at 77 - Left heart catheterization revealed proximal RCA lesion 95% stenosis and heavily calcified focal lesion, proximal RCA to mid RCA lesion 85% stenosis 90% stenosis sidebranch and acute marginal moderately calcified LAD involving a large RV marginal branch, mid RCA lesion 50% stenosis, proximal circumflex lesion 70% stenosed, mid LAD distal LAD lesion 30% stenosed - Patient will be loaded on Brilinta and restarted on IV heparin 2 hours post TR band removal -It was recommended patient be transferred to [facility] for plans for arthrectomy based PCI to the RCA on Monday, 06/17/2024 -Recommend dual antiplatelet therapy with aspirin 81 mg daily Brilinta 90 mg twice daily beyond 12 months and stenting to the RCA would likely require long stent - Continue atorvastatin  40 mg daily - EKG as needed for pain or changes -Continue telemetry monitoring -N.p.o. after midnight 06/17/2024  Chronic HFpEF -Echocardiogram reveals an LVEF of 60 to 65% -Appears to be euvolemic on exam -Currently only on bisoprolol  for soft blood pressures - NYHA class II symptoms -Continued on beta-blockers calcium  channel blockers currently on  hold -Escalate GDMT as tolerated by blood pressure and kidney function -PTA furosemide  80 mg daily currently on hold -Daily weights and I's and O's  Primary hypertension -Blood pressure 109/48 -Continued on bisoprolol  5 mg daily -Vital signs per unit protocol  Mixed hyperlipidemia -LDL 70 in 11/2023 - Lipid panel pending in a.m. as well as LP(a) -Currently continued on atorvastatin  40 mg daily  Paroxysmal atrial fibrillation - Maintaining sinus rhythm on telemetry - PTA apixaban  remains on hold while on heparin infusion - Restart apixaban  5 mg twice daily prior to discharge for CHA2DS2-VASc score of at least 6 for stroke prophylaxis - Continued on bisoprolol  5 mg daily -PTA diltiazem  remains on hold due to soft blood pressures - Continue with telemetry monitoring  6.    Tobacco abuse/COPD -Smoking cessation recommended -Not currently in exacerbation -On chronic oxygen  therapy    Risk Assessment/Risk Scores:   TIMI Risk Score for Unstable Angina or Non-ST Elevation MI:   The patient's TIMI risk score is  , which indicates a  % risk of all cause mortality, new or recurrent myocardial infarction or need for urgent revascularization in the next 14 days.    CHA2DS2-VASc Score = 6  This indicates a 9.7% annual risk of stroke. The patient's score is based upon: CHF History: 1 HTN History: 1 Diabetes History: 0 Stroke History: 0 Vascular Disease History: 1 Age Score: 2 Gender Score: 1    Code Status: Full Code  Severity of Illness: The appropriate patient status for this patient is INPATIENT. Inpatient status is judged to be reasonable and necessary in order to provide the required intensity of service to ensure the patient's safety. The patient's presenting symptoms, physical exam findings, and initial radiographic and laboratory data in the context of their chronic comorbidities is felt to place them at high risk for further clinical deterioration. Furthermore, it is not  anticipated that the patient will be medically stable for discharge from the hospital within 2 midnights of admission.   * I certify that at the point of admission it is my clinical judgment that the patient will require inpatient hospital care spanning beyond 2 midnights from the point of admission due to high intensity of service, high risk for further deterioration and high frequency of surveillance required.*  For questions or updates, please contact [facility] Please consult [email or web address] for contact info under     Signed, Dorman Calderwood, NP  06/13/2024 4:32 PM

## 2024-06-13 NOTE — Progress Notes (Signed)
 Rounding Note   Patient Name: Stacey Sandoval Date of Encounter: 06/13/2024  [facility] Cardiologist: [facility]One  Subjective Presenting with chest pain, elevated troponin Last troponin 700 Denies chest pain this morning Declining cardiac catheterization, more interested in eating Noncommittal on whether she actually wants the cardiac catheterization,  not today  Echocardiogram performed yesterday with EF 60 to 65%, grade 2 diastolic dysfunction, no focal wall motion abnormality  Scheduled Meds:  aspirin EC  81 mg Oral Daily   atorvastatin   40 mg Oral Daily   budesonide -glycopyrrolate-formoterol  2 puff Inhalation BID   gabapentin   300 mg Oral q AM   gabapentin   600 mg Oral QHS   mirtazapine  45 mg Oral QHS   pantoprazole  40 mg Oral Daily   potassium chloride   40 mEq Oral Once   Continuous Infusions:  heparin 800 Units/hr (06/13/24 0558)   magnesium  sulfate bolus IVPB     PRN Meds: albuterol    Vital Signs  Vitals:   06/13/24 0451 06/13/24 0645 06/13/24 0925 06/13/24 1010  BP:  114/73 (!) 127/55   Pulse: (!) 108 81 90   Resp: (!) 29 20 (!) 23   Temp: (!) 97.4 F (36.3 C)   97.9 F (36.6 C)  TempSrc: Oral   Axillary  SpO2: 100% 100% 97%   Weight:      Height:       No intake or output data in the 24 hours ending 06/13/24 1047    06/12/2024    6:14 AM 06/05/2023    2:15 PM 05/31/2023    2:05 PM  Last 3 Weights  Weight (lbs) 161 lb 4.8 oz 155 lb 2 oz 155 lb 3.2 oz  Weight (kg) 73.165 kg 70.364 kg 70.398 kg      Telemetry Normal sinus rhythm- Personally Reviewed  ECG   - Personally Reviewed  Physical Exam  GEN: No acute distress.   Neck: No JVD Cardiac: RRR, no murmurs, rubs, or gallops.  Respiratory: Clear to auscultation bilaterally. GI: Soft, nontender, non-distended  MS: No edema; No deformity. Neuro:  Nonfocal  Psych: Normal affect   Labs High Sensitivity Troponin:   Recent Labs  Lab 06/12/24 0627 06/12/24 0828 06/13/24 0458   TROPONINIHS 25* 479* 707*     Chemistry Recent Labs  Lab 06/12/24 0627 06/13/24 0458  NA 135 138  K 3.3* 3.5  CL 94* 100  CO2 30 32  GLUCOSE 103* 79  BUN 13 8  CREATININE 0.84 0.62  CALCIUM  8.2* 7.6*  MG  --  1.7  PROT 6.3* 5.9*  ALBUMIN 3.5 3.3*  AST 23 28  ALT 17 15  ALKPHOS 35* 39  BILITOT 0.4 0.4  GFRNONAA >60 >60  ANIONGAP 11 6    Lipids No results for input(s): CHOL, TRIG, HDL, LABVLDL, LDLCALC, CHOLHDL in the last 168 hours.  Hematology Recent Labs  Lab 06/12/24 0627 06/13/24 0458  WBC 5.5 6.3  RBC 4.36 4.14  HGB 12.7 11.9*  HCT 40.8 39.9  MCV 93.6 96.4  MCH 29.1 28.7  MCHC 31.1 29.8*  RDW 14.6 14.4  PLT 300 264   Thyroid  No results for input(s): TSH, FREET4 in the last 168 hours.  BNPNo results for input(s): BNP, PROBNP in the last 168 hours.  DDimer No results for input(s): DDIMER in the last 168 hours.   Radiology  ECHOCARDIOGRAM COMPLETE Result Date: 06/12/2024    ECHOCARDIOGRAM REPORT   Patient Name:   Stacey Sandoval Date of Exam: 06/12/2024  Medical Rec #:  [id number]       Height:       60.0 in Accession #:    [id number]      Weight:       161.3 lb Date of Birth:  [birth year]       BSA:          1.704 m Patient Age:    73 years        BP:           103/53 mmHg Patient Gender: F               HR:           66 bpm. Exam Location:  [facility] Procedure: 2D Echo, Cardiac Doppler and Color Doppler (Both Spectral and Color            Flow Doppler were utilized during procedure). Indications:     Chest pain R07.9  History:         Patient has prior history of Echocardiogram examinations, most                  recent 03/09/2022. CHF, COPD; Arrythmias:Atrial Fibrillation.                  Tobacco use.  Sonographer:     Christopher Furnace Referring Phys:  [id number] REDELL CAVE Diagnosing Phys: REDELL CAVE MD IMPRESSIONS  1. Left ventricular ejection fraction, by estimation, is 60 to 65%. The left ventricle has normal function. The left ventricle has no  regional wall motion abnormalities. There is mild left ventricular hypertrophy of the basal-septal segment. Left ventricular diastolic parameters are consistent with Grade II diastolic dysfunction (pseudonormalization).  2. Right ventricular systolic function is normal. The right ventricular size is normal.  3. Left atrial size was mild to moderately dilated.  4. The mitral valve is normal in structure. Mild mitral valve regurgitation.  5. The aortic valve was not well visualized. Aortic valve regurgitation is not visualized. Aortic valve sclerosis is present, with no evidence of aortic valve stenosis. Aortic valve mean gradient measures 7.0 mmHg. FINDINGS  Left Ventricle: Left ventricular ejection fraction, by estimation, is 60 to 65%. The left ventricle has normal function. The left ventricle has no regional wall motion abnormalities. The left ventricular internal cavity size was normal in size. There is  mild left ventricular hypertrophy of the basal-septal segment. Left ventricular diastolic parameters are consistent with Grade II diastolic dysfunction (pseudonormalization). Right Ventricle: The right ventricular size is normal. No increase in right ventricular wall thickness. Right ventricular systolic function is normal. Left Atrium: Left atrial size was mild to moderately dilated. Right Atrium: Right atrial size was normal in size. Pericardium: There is no evidence of pericardial effusion. Mitral Valve: The mitral valve is normal in structure. Mild mitral valve regurgitation. MV peak gradient, 14.0 mmHg. The mean mitral valve gradient is 6.0 mmHg. Tricuspid Valve: The tricuspid valve is normal in structure. Tricuspid valve regurgitation is not demonstrated. Aortic Valve: The aortic valve was not well visualized. Aortic valve regurgitation is not visualized. Aortic valve sclerosis is present, with no evidence of aortic valve stenosis. Aortic valve mean gradient measures 7.0 mmHg. Aortic valve peak gradient  measures 12.2 mmHg. Aortic valve area, by VTI measures 2.72 cm. Pulmonic Valve: The pulmonic valve was not well visualized. Pulmonic valve regurgitation is mild. Aorta: The aortic root is normal in size and structure. Venous: The inferior vena cava was not well visualized. IAS/Shunts: No atrial level  shunt detected by color flow Doppler.  LEFT VENTRICLE PLAX 2D LVIDd:         3.90 cm   Diastology LVIDs:         2.50 cm   LV e' medial:    6.42 cm/s LV PW:         0.90 cm   LV E/e' medial:  24.6 LV IVS:        1.20 cm   LV e' lateral:   8.70 cm/s LVOT diam:     2.00 cm   LV E/e' lateral: 18.2 LV SV:         116 LV SV Index:   68 LVOT Area:     3.14 cm  RIGHT VENTRICLE RV Basal diam:  2.60 cm RV Mid diam:    2.10 cm LEFT ATRIUM             Index        RIGHT ATRIUM           Index LA diam:        4.60 cm 2.70 cm/m   RA Area:     11.50 cm LA Vol (A2C):   69.9 ml 41.03 ml/m  RA Volume:   19.40 ml  11.39 ml/m LA Vol (A4C):   63.1 ml 37.04 ml/m LA Biplane Vol: 68.2 ml 40.03 ml/m  AORTIC VALVE AV Area (Vmax):    2.60 cm AV Area (Vmean):   2.94 cm AV Area (VTI):     2.72 cm AV Vmax:           175.00 cm/s AV Vmean:          125.000 cm/s AV VTI:            0.427 m AV Peak Grad:      12.2 mmHg AV Mean Grad:      7.0 mmHg LVOT Vmax:         145.00 cm/s LVOT Vmean:        117.000 cm/s LVOT VTI:          0.370 m LVOT/AV VTI ratio: 0.87  AORTA Ao Root diam: 2.80 cm MITRAL VALVE                TRICUSPID VALVE MV Area (PHT): 1.87 cm     TR Peak grad:   24.0 mmHg MV Area VTI:   2.02 cm     TR Vmax:        245.00 cm/s MV Peak grad:  14.0 mmHg MV Mean grad:  6.0 mmHg     SHUNTS MV Vmax:       1.87 m/s     Systemic VTI:  0.37 m MV Vmean:      114.0 cm/s   Systemic Diam: 2.00 cm MV Decel Time: 406 msec MV E velocity: 158.00 cm/s MV A velocity: 123.00 cm/s MV E/A ratio:  1.28 Redell Cave MD Electronically signed by Redell Cave MD Signature Date/Time: 06/12/2024/2:57:05 PM    Final    CT Angio Chest PE W and/or Wo  Contrast Result Date: 06/12/2024 CLINICAL DATA:  Central chest pain, severe indigestion. EXAM: CT ANGIOGRAPHY CHEST WITH CONTRAST TECHNIQUE: Multidetector CT imaging of the chest was performed using the standard protocol during bolus administration of intravenous contrast. Multiplanar CT image reconstructions and MIPs were obtained to evaluate the vascular anatomy. RADIATION DOSE REDUCTION: This exam was performed according to the departmental dose-optimization program which includes automated exposure control, adjustment of the mA and/or kV  according to patient size and/or use of iterative reconstruction technique. CONTRAST:  75mL OMNIPAQUE  IOHEXOL  350 MG/ML SOLN COMPARISON:  05/16/2023. FINDINGS: Cardiovascular: Negative for pulmonary embolus. Atherosclerotic calcification of the aorta, aortic valve and coronary arteries. Enlarged pulmonic trunk and heart. Ascending aorta measures 3.8 cm (coronal image 43). No pericardial effusion. Mediastinum/Nodes: Thyroidectomy. No pathologically enlarged mediastinal, hilar or axillary lymph nodes. Esophagus is grossly unremarkable. Lungs/Pleura: Image quality is degraded by expiratory phase imaging. Minimal dependent atelectasis. Scarring in the medial apex of the left upper lobe. Additional scarring in the lingula. No pleural fluid. Airway is unremarkable. Upper Abdomen: Cholecystectomy. Small hiatal hernia. Visualized portions of the liver, adrenal glands, kidneys, spleen, pancreas, stomach and bowel are otherwise grossly unremarkable. No upper abdominal adenopathy. Musculoskeletal: Degenerative changes in the spine. Review of the MIP images confirms the above findings. IMPRESSION: 1. Negative for pulmonary embolus. 2. Aortic atherosclerosis (ICD10-I70.0). Coronary artery calcification. 3. Enlarged pulmonic trunk, indicative of pulmonary arterial hypertension. Electronically Signed   By: Newell Eke M.D.   On: 06/12/2024 10:32   DG Chest Port 1 View Result Date:  06/12/2024 CLINICAL DATA:  Chest pain EXAM: PORTABLE CHEST 1 VIEW COMPARISON:  04/01/2022 FINDINGS: The cardio pericardial silhouette is enlarged. Left base collapse/consolidation noted peripherally with probable small left effusion. Right lung clear. Interstitial markings are diffusely coarsened with chronic features. No acute bony abnormality. Telemetry leads overlie the chest. IMPRESSION: Left base collapse/consolidation with probable small left effusion. Electronically Signed   By: Camellia Candle M.D.   On: 06/12/2024 07:07    Cardiac Studies   Patient Profile   Ms. 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 Who presents for angina, elevated troponin consistent with non-STEMI  Assessment & Plan  Non-STEMI Admitted to the hospital July 2 with chest pain, elevated troponin started on aspirin and heparin per ACS protocol Risk factors include long smoking history, hyperlipidemia -Eliquis  held in preparation for cardiac catheterization today, is on the schedule for 1:00 - Waking up this morning demanding water and coffee - Declining cardiac catheterization for no particular reason,  I do not feel like it - Requesting food - Long discussion by several physicians and nursing concerning elevated troponin, high risk of underlying ischemia.  Again declined cardiac catheterization - Cardiac catheterization called, she will be taken off the schedule, continue heparin for now continue 48 hours  Essential hypertension We will recommend we restart her bisoprolol  5 mg twice daily - Given borderline low blood pressure will hold diltiazem   Hyperlipidemia Continue Lipitor 40 daily  Paroxysmal  atrial fibrillation Continue bisoprolol  as above, diltiazem  on hold given low blood pressure, Eliquis  on hold, heparin infusion instead as above   For questions or updates, please contact [facility] Please consult [email or web address] for contact info under     Signed, Caitlyn Buchanan, MD  06/13/2024, 10:47 AM

## 2024-06-13 NOTE — Interval H&P Note (Signed)
 History and Physical Interval Note:  06/13/2024 2:05 PM  Stacey Sandoval  has presented today for surgery, with the diagnosis of non ST elevation myocardial infarction.  The various methods of treatment have been discussed with the patient and family. After consideration of risks, benefits and other options for treatment, the patient has consented to  Procedure(s): LEFT HEART CATH AND CORONARY ANGIOGRAPHY (N/A)  PERCUTANEOUS CORONARY INTERVENTION  as a surgical intervention.  The patient's history has been reviewed, patient examined, no change in status, stable for surgery.  I have reviewed the patient's chart and labs.  Questions were answered to the patient's satisfaction.     Cath Lab Visit (complete for each Cath Lab visit)  Clinical Evaluation Leading to the Procedure:   ACS: Yes.    Non-ACS:    Anginal Classification: CCS III  Anti-ischemic medical therapy: Minimal Therapy (1 class of medications)  Non-Invasive Test Results: No non-invasive testing performed  Prior CABG: No previous CABG     Alm Clay

## 2024-06-13 NOTE — Discharge Summary (Signed)
 Stacey Sandoval FMW:[id number] DOB: March 23, 1951 DOA: 06/12/2024  PCP: [facility]  Admit date: 06/12/2024 Discharge date: 06/13/2024  Time spent: 35 minutes     Discharge Diagnoses:  Principal Problem:   NSTEMI (non-ST elevated myocardial infarction) [facility]) Active Problems:   Essential hypertension   Current smoker   Obesity (BMI 30-39.9)   Chest pain   Coronary artery disease involving native heart   Paroxysmal atrial fibrillation (HCC)   Oxygen  dependent   (HFpEF) heart failure with preserved ejection fraction [facility])   Discharge Condition: stable  Diet recommendation: heart healthy  Filed Weights   06/12/24 0614  Weight: 73.2 kg    History of present illness:  From admission h and p  Stacey Sandoval is a 73 y.o. female who presents to the hospital with chest pain. Chest pain woke her from sleep at 330 AM. It is substernal and was initially constant, however, she currently does not have chest pain. It was sharp and burning. There were no aggravating or alleviating factors. Pain radiated to the L arm and into the L neck. Pain was rated 8/10. She has not had pain like this before. In the ER the pt's troponin up trended from 25 -->479 and the ER initiated a heparin drip. CTA of the chest was negative for PE. This patient will be admitted to the medicine service with cardiology consult.   Hospital Course:  Patient presents with substernal chest pain. Found to have NSTEMI (trops [phone]). Treated with aspirin and heparin. Taken for [facility] on hospital day 1 revealing widespread stenosis in the RCA. Per cardiology, patient will be loaded with antiplatelet agent (likely Brilinta) and re-started on heparin. Cardiology has arranged transfer to [facility] with plan for atherectomy-based PCI to the RCA on 7/7, cardiology as primary.  Procedures: LHC    Prox RCA lesion is 95% stenosed.  Heavily calcified focal lesion   Prox RCA to Mid RCA lesion is 85% stenosed with 90%  stenosed side branch in Acute Mrg.  Moderately calcified involving large RV marginal branch.   Mid RCA lesion is 50% stenosed.   Prox Cx lesion is 70% stenosed.  (Focal) at this point would consider medical management pending completion of the RCA intervention   Mid LAD to Dist LAD lesion is 30% stenosed.   LV end diastolic pressure is normal. There is no aortic valve stenosis.  Consultations: cardiology  Discharge Exam: Vitals:   06/13/24 1545 06/13/24 1600  BP: 124/72 125/62  Pulse: 85 78  Resp: 20 (!) 21  Temp:    SpO2: 92% 92%    General exam: Appears calm and comfortable  Respiratory system: few scattered rhonchi Cardiovascular system: S1 & S2 heard, RRR. No JVD, murmurs, rubs, gallops or clicks.   Gastrointestinal system: Abdomen is nondistended, soft and nontender.   Central nervous system: Alert and oriented. No focal neurological deficits. Extremities: Symmetric 5 x 5 power. Trace edema in feet Skin: No rashes, lesions or ulcers Psychiatry: Judgement and insight appear normal. Flat affect   Discharge Instructions   Discharge Instructions     Diet - low sodium heart healthy   Complete by: As directed    Increase activity slowly   Complete by: As directed       Allergies as of 06/13/2024   No Known Allergies      Medication List     PAUSE taking these medications    apixaban  5 MG Tabs tablet Wait to take this until your doctor or  other care provider tells you to start again. Commonly known as: Eliquis  Take 1 tablet (5 mg total) by mouth 2 (two) times daily.   diltiazem  180 MG 24 hr capsule Wait to take this until your doctor or other care provider tells you to start again. Commonly known as: CARDIZEM  CD TAKE 1 CAPSULE(180 MG) BY MOUTH DAILY   diltiazem  30 MG tablet Wait to take this until your doctor or other care provider tells you to start again. Commonly known as: Cardizem  Take 1 tablet (30 mg total) by mouth as needed (atrial fibrillation).        TAKE these medications    acetaminophen  650 MG CR tablet Commonly known as: TYLENOL  Take 1,300 mg by mouth 2 (two) times daily.   albuterol  (2.5 MG/3ML) 0.083% nebulizer solution Commonly known as: PROVENTIL  Take 3 mLs (2.5 mg total) by nebulization every 6 (six) hours as needed for wheezing or shortness of breath.   aspirin 81 MG chewable tablet Chew 1 tablet (81 mg total) by mouth before cath procedure. Start taking on: June 14, 2024   atorvastatin  40 MG tablet Commonly known as: LIPITOR Take 1 tablet (40 mg total) by mouth daily. Start taking on: June 14, 2024 What changed:  medication strength how much to take   Biotin 10 MG Caps   bisoprolol  5 MG tablet Commonly known as: ZEBETA  TAKE 1 TABLET BY MOUTH EVERY DAY What changed: when to take this   Breztri  Aerosphere 160-9-4.8 MCG/ACT Aero inhaler Generic drug: budesonide -glycopyrrolate-formoterol Inhale 2 puffs into the lungs in the morning and at bedtime. What changed: Another medication with the same name was removed. Continue taking this medication, and follow the directions you see here.   calcitRIOL 0.25 MCG capsule Commonly known as: ROCALTROL Take 0.25 mcg by mouth 2 (two) times daily.   famotidine  40 MG tablet Commonly known as: PEPCID  Take 40 mg by mouth 2 (two) times daily.   furosemide  80 MG tablet Commonly known as: Lasix  Take 1 tablet (80 mg total) by mouth daily.   gabapentin  300 MG capsule Commonly known as: NEURONTIN  Take 300 mg every am & 600 in the pm   heparin 25000 UT/250ML infusion Inject 800 Units/hr into the vein continuous.   levothyroxine 100 MCG tablet Commonly known as: SYNTHROID Take 100 mcg by mouth daily before breakfast.   mirtazapine 45 MG tablet Commonly known as: REMERON Take 45 mg by mouth at bedtime.   OXYGEN  Inhale into the lungs as needed. 2-3 Liters   potassium chloride  SA 20 MEQ tablet Commonly known as: KLOR-CON  M Take 1 tablet (20 mEq total) by mouth  2 (two) times daily.   triamcinolone ointment 0.5 % Commonly known as: KENALOG Apply 1 Application topically 2 (two) times daily.       No Known Allergies    The results of significant diagnostics from this hospitalization (including imaging, microbiology, ancillary and laboratory) are listed below for reference.    Significant Diagnostic Studies: CARDIAC CATHETERIZATION Result Date: 06/13/2024 Images from the original result were not included.   Prox RCA lesion is 95% stenosed.  Heavily calcified focal lesion   Prox RCA to Mid RCA lesion is 85% stenosed with 90% stenosed side branch in Acute Mrg.  Moderately calcified involving large RV marginal branch.   Mid RCA lesion is 50% stenosed.   Prox Cx lesion is 70% stenosed.  (Focal) at this point would consider medical management pending completion of the RCA intervention   Mid LAD to [facility] LAD  lesion is 30% stenosed.   LV end diastolic pressure is normal. There is no aortic valve stenosis. Dominance: Right body RECOMMENDATIONS   Plan will be to load with antiplatelet agent (likely Brilinta), restart IV heparin 2 hours post TR band removal.   Will arrange transfer to [facility] with plans for atherectomy based PCI to the RCA on Monday, 06/17/2024   Recommend dual antiplatelet therapy with Aspirin 81mg  daily and Ticagrelor 90mg  twice daily long-term (beyond 12 months) because of Will likely require long stent in the RCA. Alm Clay, MD, MS Alm Clay, M.D., M.S. Interventional Cardiologist [facility] Pager # [phone]   ECHOCARDIOGRAM COMPLETE Result Date: 06/12/2024    ECHOCARDIOGRAM REPORT   Patient Name:   LUCY BOARDMAN Date of Exam: 06/12/2024 Medical Rec #:  [id number]       Height:       60.0 in Accession #:    [id number]      Weight:       161.3 lb Date of Birth:  09/13/1951       BSA:          1.704 m Patient Age:    73 years        BP:           103/53 mmHg Patient Gender: F               HR:           66 bpm. Exam  Location:  [facility] Procedure: 2D Echo, Cardiac Doppler and Color Doppler (Both Spectral and Color            Flow Doppler were utilized during procedure). Indications:     Chest pain R07.9  History:         Patient has prior history of Echocardiogram examinations, most                  recent 03/09/2022. CHF, COPD; Arrythmias:Atrial Fibrillation.                  Tobacco use.  Sonographer:     Christopher Furnace Referring Phys:  [id number] REDELL CAVE Diagnosing Phys: REDELL CAVE MD IMPRESSIONS  1. Left ventricular ejection fraction, by estimation, is 60 to 65%. The left ventricle has normal function. The left ventricle has no regional wall motion abnormalities. There is mild left ventricular hypertrophy of the basal-septal segment. Left ventricular diastolic parameters are consistent with Grade II diastolic dysfunction (pseudonormalization).  2. Right ventricular systolic function is normal. The right ventricular size is normal.  3. Left atrial size was mild to moderately dilated.  4. The mitral valve is normal in structure. Mild mitral valve regurgitation.  5. The aortic valve was not well visualized. Aortic valve regurgitation is not visualized. Aortic valve sclerosis is present, with no evidence of aortic valve stenosis. Aortic valve mean gradient measures 7.0 mmHg. FINDINGS  Left Ventricle: Left ventricular ejection fraction, by estimation, is 60 to 65%. The left ventricle has normal function. The left ventricle has no regional wall motion abnormalities. The left ventricular internal cavity size was normal in size. There is  mild left ventricular hypertrophy of the basal-septal segment. Left ventricular diastolic parameters are consistent with Grade II diastolic dysfunction (pseudonormalization). Right Ventricle: The right ventricular size is normal. No increase in right ventricular wall thickness. Right ventricular systolic function is normal. Left Atrium: Left atrial size was mild to moderately dilated. Right  Atrium: Right atrial size was normal in size. Pericardium:  There is no evidence of pericardial effusion. Mitral Valve: The mitral valve is normal in structure. Mild mitral valve regurgitation. MV peak gradient, 14.0 mmHg. The mean mitral valve gradient is 6.0 mmHg. Tricuspid Valve: The tricuspid valve is normal in structure. Tricuspid valve regurgitation is not demonstrated. Aortic Valve: The aortic valve was not well visualized. Aortic valve regurgitation is not visualized. Aortic valve sclerosis is present, with no evidence of aortic valve stenosis. Aortic valve mean gradient measures 7.0 mmHg. Aortic valve peak gradient measures 12.2 mmHg. Aortic valve area, by VTI measures 2.72 cm. Pulmonic Valve: The pulmonic valve was not well visualized. Pulmonic valve regurgitation is mild. Aorta: The aortic root is normal in size and structure. Venous: The inferior vena cava was not well visualized. IAS/Shunts: No atrial level shunt detected by color flow Doppler.  LEFT VENTRICLE PLAX 2D LVIDd:         3.90 cm   Diastology LVIDs:         2.50 cm   LV e' medial:    6.42 cm/s LV PW:         0.90 cm   LV E/e' medial:  24.6 LV IVS:        1.20 cm   LV e' lateral:   8.70 cm/s LVOT diam:     2.00 cm   LV E/e' lateral: 18.2 LV SV:         116 LV SV Index:   68 LVOT Area:     3.14 cm  RIGHT VENTRICLE RV Basal diam:  2.60 cm RV Mid diam:    2.10 cm LEFT ATRIUM             Index        RIGHT ATRIUM           Index LA diam:        4.60 cm 2.70 cm/m   RA Area:     11.50 cm LA Vol (A2C):   69.9 ml 41.03 ml/m  RA Volume:   19.40 ml  11.39 ml/m LA Vol (A4C):   63.1 ml 37.04 ml/m LA Biplane Vol: 68.2 ml 40.03 ml/m  AORTIC VALVE AV Area (Vmax):    2.60 cm AV Area (Vmean):   2.94 cm AV Area (VTI):     2.72 cm AV Vmax:           175.00 cm/s AV Vmean:          125.000 cm/s AV VTI:            0.427 m AV Peak Grad:      12.2 mmHg AV Mean Grad:      7.0 mmHg LVOT Vmax:         145.00 cm/s LVOT Vmean:        117.000 cm/s LVOT VTI:           0.370 m LVOT/AV VTI ratio: 0.87  AORTA Ao Root diam: 2.80 cm MITRAL VALVE                TRICUSPID VALVE MV Area (PHT): 1.87 cm     TR Peak grad:   24.0 mmHg MV Area VTI:   2.02 cm     TR Vmax:        245.00 cm/s MV Peak grad:  14.0 mmHg MV Mean grad:  6.0 mmHg     SHUNTS MV Vmax:       1.87 m/s     Systemic VTI:  0.37 m MV Vmean:  114.0 cm/s   Systemic Diam: 2.00 cm MV Decel Time: 406 msec MV E velocity: 158.00 cm/s MV A velocity: 123.00 cm/s MV E/A ratio:  1.28 Redell Cave MD Electronically signed by Redell Cave MD Signature Date/Time: 06/12/2024/2:57:05 PM    Final    CT Angio Chest PE W and/or Wo Contrast Result Date: 06/12/2024 CLINICAL DATA:  Central chest pain, severe indigestion. EXAM: CT ANGIOGRAPHY CHEST WITH CONTRAST TECHNIQUE: Multidetector CT imaging of the chest was performed using the standard protocol during bolus administration of intravenous contrast. Multiplanar CT image reconstructions and MIPs were obtained to evaluate the vascular anatomy. RADIATION DOSE REDUCTION: This exam was performed according to the departmental dose-optimization program which includes automated exposure control, adjustment of the mA and/or kV according to patient size and/or use of iterative reconstruction technique. CONTRAST:  75mL OMNIPAQUE  IOHEXOL  350 MG/ML SOLN COMPARISON:  05/16/2023. FINDINGS: Cardiovascular: Negative for pulmonary embolus. Atherosclerotic calcification of the aorta, aortic valve and coronary arteries. Enlarged pulmonic trunk and heart. Ascending aorta measures 3.8 cm (coronal image 43). No pericardial effusion. Mediastinum/Nodes: Thyroidectomy. No pathologically enlarged mediastinal, hilar or axillary lymph nodes. Esophagus is grossly unremarkable. Lungs/Pleura: Image quality is degraded by expiratory phase imaging. Minimal dependent atelectasis. Scarring in the medial apex of the left upper lobe. Additional scarring in the lingula. No pleural fluid. Airway is unremarkable.  Upper Abdomen: Cholecystectomy. Small hiatal hernia. Visualized portions of the liver, adrenal glands, kidneys, spleen, pancreas, stomach and bowel are otherwise grossly unremarkable. No upper abdominal adenopathy. Musculoskeletal: Degenerative changes in the spine. Review of the MIP images confirms the above findings. IMPRESSION: 1. Negative for pulmonary embolus. 2. Aortic atherosclerosis (ICD10-I70.0). Coronary artery calcification. 3. Enlarged pulmonic trunk, indicative of pulmonary arterial hypertension. Electronically Signed   By: Newell Eke M.D.   On: 06/12/2024 10:32   DG Chest Port 1 View Result Date: 06/12/2024 CLINICAL DATA:  Chest pain EXAM: PORTABLE CHEST 1 VIEW COMPARISON:  04/01/2022 FINDINGS: The cardio pericardial silhouette is enlarged. Left base collapse/consolidation noted peripherally with probable small left effusion. Right lung clear. Interstitial markings are diffusely coarsened with chronic features. No acute bony abnormality. Telemetry leads overlie the chest. IMPRESSION: Left base collapse/consolidation with probable small left effusion. Electronically Signed   By: Camellia Candle M.D.   On: 06/12/2024 07:07    Microbiology: No results found for this or any previous visit (from the past 240 hours).   Labs: Basic Metabolic Panel: Recent Labs  Lab 06/12/24 0627 06/13/24 0458  NA 135 138  K 3.3* 3.5  CL 94* 100  CO2 30 32  GLUCOSE 103* 79  BUN 13 8  CREATININE 0.84 0.62  CALCIUM  8.2* 7.6*  MG  --  1.7  PHOS  --  3.2   Liver Function Tests: Recent Labs  Lab 06/12/24 0627 06/13/24 0458  AST 23 28  ALT 17 15  ALKPHOS 35* 39  BILITOT 0.4 0.4  PROT 6.3* 5.9*  ALBUMIN 3.5 3.3*   No results for input(s): LIPASE, AMYLASE in the last 168 hours. No results for input(s): AMMONIA in the last 168 hours. CBC: Recent Labs  Lab 06/12/24 0627 06/13/24 0458  WBC 5.5 6.3  HGB 12.7 11.9*  HCT 40.8 39.9  MCV 93.6 96.4  PLT 300 264   Cardiac Enzymes: No  results for input(s): CKTOTAL, CKMB, CKMBINDEX, TROPONINI in the last 168 hours. BNP: BNP (last 3 results) No results for input(s): BNP in the last 8760 hours.  ProBNP (last 3 results) No results for input(s): PROBNP  in the last 8760 hours.  CBG: No results for input(s): GLUCAP in the last 168 hours.     Signed:  Devaughn KATHEE Ban MD.  [facility] 06/13/2024, 4:09 PM

## 2024-06-13 NOTE — Consult Note (Signed)
 PHARMACY - ANTICOAGULATION CONSULT NOTE  Pharmacy Consult for heparin infusion Indication: chest pain/ACS  No Known Allergies  Patient Measurements: Height: 5' (152.4 cm) (from OV note) Weight: 73.2 kg (161 lb 4.8 oz) IBW/kg (Calculated) : 45.5  Vital Signs: Temp: 97.4 F (36.3 C) (07/03 0451) Temp Source: Oral (07/03 0451) BP: 116/49 (07/03 0200) Pulse Rate: 108 (07/03 0451)  Labs: Recent Labs    06/12/24 0627 06/12/24 0828 06/12/24 0931 06/12/24 1933 06/13/24 0458  HGB 12.7  --   --   --  11.9*  HCT 40.8  --   --   --  39.9  PLT 300  --   --   --  264  APTT  --   --  32 83* 114*  LABPROT  --   --  15.3*  --   --   INR  --   --  1.1  --   --   HEPARINUNFRC  --   --  >1.10*  --  >1.10*  CREATININE 0.84  --   --   --  0.62  TROPONINIHS 25* 479*  --   --   --     Estimated Creatinine Clearance: 54.3 mL/min (by C-G formula based on SCr of 0.62 mg/dL).   Medical History: Past Medical History:  Diagnosis Date   Atrial fibrillation (HCC)    CHF (congestive heart failure) (HCC)    Complication of anesthesia    VERY slow to wake after hysterectomy   COPD (chronic obstructive pulmonary disease) (HCC)    Enlarged thyroid     has been referred to [facility] [address]   Hyperlipidemia    Hypertension    Smokers' cough (HCC)    Tobacco use    Wears dentures    Full upper and lower    Medications:  Patient takes Eliquis  5 mg po BID, patient reported to nurse last dose was 06/11/24 @ 2200  Assessment: 73 yo female presenting to ED with complaint of chest pain.  PMH includes COPD, GERD, Afib on Eliquis , and CHF.  Pharmacy consulted to initiate heparin infusion.  Baseline aPTT, HL, and PT/INR ordered  Date Time HL/aPTT Rate/Comment 07/02 1016 >1.10 / 32 Baseline 07/02 1952 83  Therapeutic x1 07/03   0458     >1.10 / 114    Elevated   Goal of Therapy:  Heparin level 0.3-0.7 units/ml aPTT 66-102 seconds Monitor platelets by anticoagulation protocol: Yes   Plan:  7/03 @ 0458:   aPTT = 114,  HL = > 1.10  - aPTT elevated,  HL elevated from Eliquis  PTA - will decrease heparin drip rate to 800 units/hr and recheck  aPTT 8 hrs after rate change - recheck HL on 7/4 with AM labs - dose by aPTT until correlating with HL  Daily CBC while on heparin  Thank you for involving pharmacy in this patient's care.   Braelen Sproule D Clinical Pharmacist 06/13/2024 5:59 AM

## 2024-06-13 NOTE — ED Notes (Signed)
 Discussed with pt the new troponin result which shows increase in levels.Pt continues to decline cath today.

## 2024-06-13 NOTE — Progress Notes (Signed)
 PROGRESS NOTE    Stacey Sandoval  FMW:[id number] DOB: [birth year] DOA: 06/12/2024 PCP: [facility]  Out[facility] Specialists: cardiology, pulmonology    Brief Narrative:   From admission h and p  Stacey Sandoval is a 73 y.o. female who presents to the hospital with chest pain. Chest pain woke her from sleep at 330 AM. It is substernal and was initially constant, however, she currently does not have chest pain. It was sharp and burning. There were no aggravating or alleviating factors. Pain radiated to the L arm and into the L neck. Pain was rated 8/10. She has not had pain like this before. In the ER the pt's troponin up trended from 25 -->479 and the ER initiated a heparin drip. CTA of the chest was negative for PE. This patient will be admitted to the medicine service with cardiology consult.   Assessment & Plan:   Principal Problem:   Non-ST elevation (NSTEMI) myocardial infarction [facility]) Active Problems:   Essential hypertension   Current smoker   Obesity (BMI 30-39.9)   Chest pain   Coronary artery disease involving native heart   Paroxysmal atrial fibrillation (HCC)   Oxygen  dependent   (HFpEF) heart failure with preserved ejection fraction (HCC)   # NSTEMI  Substernal chest pain, no ischemic EKG changes, normal EF with no RWMAs, trop elevated to 479. Detailed discussion today of risks/benefits of cardiac cath, strongly advised her to proceed, she continues to decline. She is aware that if cath is not performed today could be several days before next opportunity, and that there is risk with delay. Chest pain is resolved, hemodynamically stable. - will alert cardiology - continue heparin, aspirin, statin - f/u A1c, lipids  # COPD, severe Stable, no exacerbation - home breztri   # Chronic hypoxic respiratory failure Stable on home 2 liters  # Paroxysmal a-fib In sinus currently - heparin as above, home apixaban  on hold - home bisoprolol , dilt are on  hold per cardiology  # HFpEF Appears euvolemic - home lasix  on hold per cardiology  # Neuropathy - home gabapentin   # Hypothyroid - home levothyroxine  # MDD - home mirtazapine    DVT prophylaxis: therapeutic heparin Code Status: full Family Communication: none at bedside  Level of care: Telemetry Cardiac Status is: Inpatient Remains inpatient appropriate because: severity of illness    Consultants:  cardiology  Procedures: None thus far  Antimicrobials:  none    Subjective: No chest pain, feels at baseline  Objective: Vitals:   06/13/24 0228 06/13/24 0451 06/13/24 0645 06/13/24 0925  BP:   114/73 (!) 127/55  Pulse: 89 (!) 108 81 90  Resp: (!) 21 (!) 29 20 (!) 23  Temp: 98 F (36.7 C) (!) 97.4 F (36.3 C)    TempSrc: Oral Oral    SpO2: 97% 100% 100% 97%  Weight:      Height:       No intake or output data in the 24 hours ending 06/13/24 0937 Filed Weights   06/12/24 0614  Weight: 73.2 kg    Examination:  General exam: Appears calm and comfortable  Respiratory system: few scattered rhonchi Cardiovascular system: S1 & S2 heard, RRR. No JVD, murmurs, rubs, gallops or clicks.   Gastrointestinal system: Abdomen is nondistended, soft and nontender.   Central nervous system: Alert and oriented. No focal neurological deficits. Extremities: Symmetric 5 x 5 power. Trace edema in feet Skin: No rashes, lesions or ulcers Psychiatry: Judgement and insight appear normal. Flat  affect     Data Reviewed: I have personally reviewed following labs and imaging studies  CBC: Recent Labs  Lab 06/12/24 0627 06/13/24 0458  WBC 5.5 6.3  HGB 12.7 11.9*  HCT 40.8 39.9  MCV 93.6 96.4  PLT 300 264   Basic Metabolic Panel: Recent Labs  Lab 06/12/24 0627 06/13/24 0458  NA 135 138  K 3.3* 3.5  CL 94* 100  CO2 30 32  GLUCOSE 103* 79  BUN 13 8  CREATININE 0.84 0.62  CALCIUM  8.2* 7.6*  MG  --  1.7  PHOS  --  3.2   GFR: Estimated Creatinine Clearance:  54.3 mL/min (by C-G formula based on SCr of 0.62 mg/dL). Liver Function Tests: Recent Labs  Lab 06/12/24 0627 06/13/24 0458  AST 23 28  ALT 17 15  ALKPHOS 35* 39  BILITOT 0.4 0.4  PROT 6.3* 5.9*  ALBUMIN 3.5 3.3*   No results for input(s): LIPASE, AMYLASE in the last 168 hours. No results for input(s): AMMONIA in the last 168 hours. Coagulation Profile: Recent Labs  Lab 06/12/24 0931  INR 1.1   Cardiac Enzymes: No results for input(s): CKTOTAL, CKMB, CKMBINDEX, TROPONINI in the last 168 hours. BNP (last 3 results) No results for input(s): PROBNP in the last 8760 hours. HbA1C: No results for input(s): HGBA1C in the last 72 hours. CBG: No results for input(s): GLUCAP in the last 168 hours. Lipid Profile: No results for input(s): CHOL, HDL, LDLCALC, TRIG, CHOLHDL, LDLDIRECT in the last 72 hours. Thyroid  Function Tests: No results for input(s): TSH, T4TOTAL, FREET4, T3FREE, THYROIDAB in the last 72 hours. Anemia Panel: No results for input(s): VITAMINB12, FOLATE, FERRITIN, TIBC, IRON, RETICCTPCT in the last 72 hours. Urine analysis: No results found for: COLORURINE, APPEARANCEUR, LABSPEC, PHURINE, GLUCOSEU, HGBUR, BILIRUBINUR, KETONESUR, PROTEINUR, UROBILINOGEN, NITRITE, LEUKOCYTESUR Sepsis Labs: @LABRCNTIP (procalcitonin:4,lacticidven:4)  )No results found for this or any previous visit (from the past 240 hours).       Radiology Studies: ECHOCARDIOGRAM COMPLETE Result Date: 06/12/2024    ECHOCARDIOGRAM REPORT   Patient Name:   Stacey Sandoval Date of Exam: 06/12/2024 Medical Rec #:  [id number]       Height:       60.0 in Accession #:    [id number]      Weight:       161.3 lb Date of Birth:  08-03-1951       BSA:          1.704 m Patient Age:    73 years        BP:           103/53 mmHg Patient Gender: F               HR:           66 bpm. Exam Location:  [facility] Procedure: 2D Echo, Cardiac Doppler and  Color Doppler (Both Spectral and Color            Flow Doppler were utilized during procedure). Indications:     Chest pain R07.9  History:         Patient has prior history of Echocardiogram examinations, most                  recent 03/09/2022. CHF, COPD; Arrythmias:Atrial Fibrillation.                  Tobacco use.  Sonographer:     Christopher Furnace Referring Phys:  [id number] REDELL CAVE Diagnosing Phys: REDELL  Agbor-Etang MD IMPRESSIONS  1. Left ventricular ejection fraction, by estimation, is 60 to 65%. The left ventricle has normal function. The left ventricle has no regional wall motion abnormalities. There is mild left ventricular hypertrophy of the basal-septal segment. Left ventricular diastolic parameters are consistent with Grade II diastolic dysfunction (pseudonormalization).  2. Right ventricular systolic function is normal. The right ventricular size is normal.  3. Left atrial size was mild to moderately dilated.  4. The mitral valve is normal in structure. Mild mitral valve regurgitation.  5. The aortic valve was not well visualized. Aortic valve regurgitation is not visualized. Aortic valve sclerosis is present, with no evidence of aortic valve stenosis. Aortic valve mean gradient measures 7.0 mmHg. FINDINGS  Left Ventricle: Left ventricular ejection fraction, by estimation, is 60 to 65%. The left ventricle has normal function. The left ventricle has no regional wall motion abnormalities. The left ventricular internal cavity size was normal in size. There is  mild left ventricular hypertrophy of the basal-septal segment. Left ventricular diastolic parameters are consistent with Grade II diastolic dysfunction (pseudonormalization). Right Ventricle: The right ventricular size is normal. No increase in right ventricular wall thickness. Right ventricular systolic function is normal. Left Atrium: Left atrial size was mild to moderately dilated. Right Atrium: Right atrial size was normal in size. Pericardium:  There is no evidence of pericardial effusion. Mitral Valve: The mitral valve is normal in structure. Mild mitral valve regurgitation. MV peak gradient, 14.0 mmHg. The mean mitral valve gradient is 6.0 mmHg. Tricuspid Valve: The tricuspid valve is normal in structure. Tricuspid valve regurgitation is not demonstrated. Aortic Valve: The aortic valve was not well visualized. Aortic valve regurgitation is not visualized. Aortic valve sclerosis is present, with no evidence of aortic valve stenosis. Aortic valve mean gradient measures 7.0 mmHg. Aortic valve peak gradient measures 12.2 mmHg. Aortic valve area, by VTI measures 2.72 cm. Pulmonic Valve: The pulmonic valve was not well visualized. Pulmonic valve regurgitation is mild. Aorta: The aortic root is normal in size and structure. Venous: The inferior vena cava was not well visualized. IAS/Shunts: No atrial level shunt detected by color flow Doppler.  LEFT VENTRICLE PLAX 2D LVIDd:         3.90 cm   Diastology LVIDs:         2.50 cm   LV e' medial:    6.42 cm/s LV PW:         0.90 cm   LV E/e' medial:  24.6 LV IVS:        1.20 cm   LV e' lateral:   8.70 cm/s LVOT diam:     2.00 cm   LV E/e' lateral: 18.2 LV SV:         116 LV SV Index:   68 LVOT Area:     3.14 cm  RIGHT VENTRICLE RV Basal diam:  2.60 cm RV Mid diam:    2.10 cm LEFT ATRIUM             Index        RIGHT ATRIUM           Index LA diam:        4.60 cm 2.70 cm/m   RA Area:     11.50 cm LA Vol (A2C):   69.9 ml 41.03 ml/m  RA Volume:   19.40 ml  11.39 ml/m LA Vol (A4C):   63.1 ml 37.04 ml/m LA Biplane Vol: 68.2 ml 40.03 ml/m  AORTIC VALVE AV Area (  Vmax):    2.60 cm AV Area (Vmean):   2.94 cm AV Area (VTI):     2.72 cm AV Vmax:           175.00 cm/s AV Vmean:          125.000 cm/s AV VTI:            0.427 m AV Peak Grad:      12.2 mmHg AV Mean Grad:      7.0 mmHg LVOT Vmax:         145.00 cm/s LVOT Vmean:        117.000 cm/s LVOT VTI:          0.370 m LVOT/AV VTI ratio: 0.87  AORTA Ao Root diam:  2.80 cm MITRAL VALVE                TRICUSPID VALVE MV Area (PHT): 1.87 cm     TR Peak grad:   24.0 mmHg MV Area VTI:   2.02 cm     TR Vmax:        245.00 cm/s MV Peak grad:  14.0 mmHg MV Mean grad:  6.0 mmHg     SHUNTS MV Vmax:       1.87 m/s     Systemic VTI:  0.37 m MV Vmean:      114.0 cm/s   Systemic Diam: 2.00 cm MV Decel Time: 406 msec MV E velocity: 158.00 cm/s MV A velocity: 123.00 cm/s MV E/A ratio:  1.28 Redell Cave MD Electronically signed by Redell Cave MD Signature Date/Time: 06/12/2024/2:57:05 PM    Final    CT Angio Chest PE W and/or Wo Contrast Result Date: 06/12/2024 CLINICAL DATA:  Central chest pain, severe indigestion. EXAM: CT ANGIOGRAPHY CHEST WITH CONTRAST TECHNIQUE: Multidetector CT imaging of the chest was performed using the standard protocol during bolus administration of intravenous contrast. Multiplanar CT image reconstructions and MIPs were obtained to evaluate the vascular anatomy. RADIATION DOSE REDUCTION: This exam was performed according to the departmental dose-optimization program which includes automated exposure control, adjustment of the mA and/or kV according to patient size and/or use of iterative reconstruction technique. CONTRAST:  75mL OMNIPAQUE  IOHEXOL  350 MG/ML SOLN COMPARISON:  05/16/2023. FINDINGS: Cardiovascular: Negative for pulmonary embolus. Atherosclerotic calcification of the aorta, aortic valve and coronary arteries. Enlarged pulmonic trunk and heart. Ascending aorta measures 3.8 cm (coronal image 43). No pericardial effusion. Mediastinum/Nodes: Thyroidectomy. No pathologically enlarged mediastinal, hilar or axillary lymph nodes. Esophagus is grossly unremarkable. Lungs/Pleura: Image quality is degraded by expiratory phase imaging. Minimal dependent atelectasis. Scarring in the medial apex of the left upper lobe. Additional scarring in the lingula. No pleural fluid. Airway is unremarkable. Upper Abdomen: Cholecystectomy. Small hiatal hernia.  Visualized portions of the liver, adrenal glands, kidneys, spleen, pancreas, stomach and bowel are otherwise grossly unremarkable. No upper abdominal adenopathy. Musculoskeletal: Degenerative changes in the spine. Review of the MIP images confirms the above findings. IMPRESSION: 1. Negative for pulmonary embolus. 2. Aortic atherosclerosis (ICD10-I70.0). Coronary artery calcification. 3. Enlarged pulmonic trunk, indicative of pulmonary arterial hypertension. Electronically Signed   By: Newell Eke M.D.   On: 06/12/2024 10:32   DG Chest Port 1 View Result Date: 06/12/2024 CLINICAL DATA:  Chest pain EXAM: PORTABLE CHEST 1 VIEW COMPARISON:  04/01/2022 FINDINGS: The cardio pericardial silhouette is enlarged. Left base collapse/consolidation noted peripherally with probable small left effusion. Right lung clear. Interstitial markings are diffusely coarsened with chronic features. No acute bony abnormality. Telemetry leads overlie the  chest. IMPRESSION: Left base collapse/consolidation with probable small left effusion. Electronically Signed   By: Camellia Candle M.D.   On: 06/12/2024 07:07        Scheduled Meds:  aspirin EC  81 mg Oral Daily   atorvastatin   40 mg Oral Daily   budesonide -glycopyrrolate-formoterol  2 puff Inhalation BID   gabapentin   300 mg Oral q AM   gabapentin   600 mg Oral QHS   mirtazapine  45 mg Oral QHS   pantoprazole  40 mg Oral Daily   potassium chloride   40 mEq Oral Once   Continuous Infusions:  heparin 800 Units/hr (06/13/24 0558)   magnesium  sulfate bolus IVPB       LOS: 1 day     Zaylei Mullane B Sohan Potvin, MD [facility]   If 7PM-7AM, please contact night-coverage [email or web address] Password [email or web address] 06/13/2024, 9:37 AM

## 2024-06-13 NOTE — ED Notes (Signed)
 Pt reminded of NPO status until seen by providers on dayshift

## 2024-06-13 NOTE — Progress Notes (Signed)
 PHARMACY - ANTICOAGULATION CONSULT NOTE  Pharmacy Consult for heparin gtt Indication: chest pain/ACS  No Known Allergies  Patient Measurements: Height: 5' 1 (154.9 cm) Weight: 71.3 kg (157 lb 1.6 oz) IBW/kg (Calculated) : 47.8 HEPARIN DW (KG): 63.2  Vital Signs: Temp: 98 F (36.7 C) (07/03 2053) Temp Source: Oral (07/03 2053) BP: 122/47 (07/03 2100) Pulse Rate: 85 (07/03 2053)  Labs: Recent Labs    06/12/24 0627 06/12/24 0828 06/12/24 0931 06/12/24 1933 06/13/24 0458  HGB 12.7  --   --   --  11.9*  HCT 40.8  --   --   --  39.9  PLT 300  --   --   --  264  APTT  --   --  32 83* 114*  LABPROT  --   --  15.3*  --   --   INR  --   --  1.1  --   --   HEPARINUNFRC  --   --  >1.10*  --  >1.10*  CREATININE 0.84  --   --   --  0.62  TROPONINIHS 25* 479*  --   --  707*    Estimated Creatinine Clearance: 54.9 mL/min (by C-G formula based on SCr of 0.62 mg/dL).   Medical History: Past Medical History:  Diagnosis Date   Atrial fibrillation (HCC)    CHF (congestive heart failure) (HCC)    Complication of anesthesia    VERY slow to wake after hysterectomy   COPD (chronic obstructive pulmonary disease) (HCC)    Enlarged thyroid     has been referred to [facility]   Hyperlipidemia    Hypertension    Smokers' cough (HCC)    Tobacco use    Wears dentures    Full upper and lower    Medications:  Medications Prior to Admission  Medication Sig Dispense Refill Last Dose/Taking   acetaminophen  (TYLENOL ) 650 MG CR tablet Take 1,300 mg by mouth 2 (two) times daily.      albuterol  (PROVENTIL ) (2.5 MG/3ML) 0.083% nebulizer solution Take 3 mLs (2.5 mg total) by nebulization every 6 (six) hours as needed for wheezing or shortness of breath. 75 mL 2    [Paused] apixaban  (ELIQUIS ) 5 MG TABS tablet Take 1 tablet (5 mg total) by mouth 2 (two) times daily. 28 tablet 0    [START ON 06/14/2024] aspirin 81 MG chewable tablet Chew 1 tablet (81 mg total) by mouth before cath procedure.      [START  ON 06/14/2024] atorvastatin  (LIPITOR) 40 MG tablet Take 1 tablet (40 mg total) by mouth daily.      Biotin 10 MG CAPS  (Patient not taking: Reported on 02/07/2023)      bisoprolol  (ZEBETA ) 5 MG tablet TAKE 1 TABLET BY MOUTH EVERY DAY (Patient taking differently: Take 5 mg by mouth 2 (two) times daily.) 90 tablet 3    Budeson-Glycopyrrol-Formoterol (BREZTRI  AEROSPHERE) 160-9-4.8 MCG/ACT AERO Inhale 2 puffs into the lungs in the morning and at bedtime. 5.9 g 0    calcitRIOL (ROCALTROL) 0.25 MCG capsule Take 0.25 mcg by mouth 2 (two) times daily.      [Paused] diltiazem  (CARDIZEM  CD) 180 MG 24 hr capsule TAKE 1 CAPSULE(180 MG) BY MOUTH DAILY 90 capsule 2    [Paused] diltiazem  (CARDIZEM ) 30 MG tablet Take 1 tablet (30 mg total) by mouth as needed (atrial fibrillation). 30 tablet 3    famotidine  (PEPCID ) 40 MG tablet Take 40 mg by mouth 2 (two) times daily.  furosemide  (LASIX ) 80 MG tablet Take 1 tablet (80 mg total) by mouth daily. 90 tablet 3    gabapentin  (NEURONTIN ) 300 MG capsule Take 300 mg every am & 600 in the pm      heparin 25000 UT/250ML infusion Inject 800 Units/hr into the vein continuous.      levothyroxine (SYNTHROID) 100 MCG tablet Take 100 mcg by mouth daily before breakfast.      mirtazapine (REMERON) 45 MG tablet Take 45 mg by mouth at bedtime.      OXYGEN  Inhale into the lungs as needed. 2-3 Liters      potassium chloride  SA (KLOR-CON  M) 20 MEQ tablet Take 1 tablet (20 mEq total) by mouth 2 (two) times daily. 180 tablet 3    triamcinolone ointment (KENALOG) 0.5 % Apply 1 Application topically 2 (two) times daily.       Assessment: 75 YOF with a PMH significant for pAF on apixaban  PTA. Last dose 7/1 @2200  Heparin consult for ACS received She was transferred from [facility] where heparin gtt was initiated. She arrived and it was infusing. Started ~1900 this evening  Goal of Therapy:  Heparin level 0.3-0.7 units/ml aPTT 66-102 seconds Monitor platelets by anticoagulation protocol:  Yes   Plan:  Continue heparin infusion at 800 units/hr Check heparin level (HL) and aPTT 7/4 at 0300 and daily while on heparin May DC aPTT once correlates with HL Continue to monitor H&H and platelets  Sevin Langenbach BS, PharmD, BCPS Clinical Pharmacist 06/13/2024 9:13 PM  Contact: [phone] after 3 PM  Be curious, not judgmental... -Davina Sprinkles

## 2024-06-13 NOTE — ED Notes (Signed)
 Pt states,I have decided that I am not doing anything today that requires NPO, so please could you get me coffee and ice water, informed pt that I would notify the provider

## 2024-06-13 NOTE — Progress Notes (Addendum)
 Please see H&P written by Tylene Lunch on 06/13/2024 at 16:08. Stacey Sandoval presents [facility] for NSTEMI requiring atherectomy which is currently scheduled for Monday July 7th. She presents to [facility] on heparin gtt, s/p ASA 325 mg. Plans in prior notes were to load Stacey Sandoval with Ticagrelor after the TR band was removed. Upon arrival to [facility], the TR band was already removed, however I do not see any documentation that she received ticagrelor at [facility] prior to arrival to [facility] (prior [facility] encounter was reviewed by both me and evening pharmacist). Stacey Sandoval is currently on Apixaban  for atrial fibrillation. Because of this, we will load her with clopidogrel instead of ticagrelor.   Stacey Blood, MD MS  Cardiology [employer]

## 2024-06-13 NOTE — ED Notes (Addendum)
 This nurse request pt verbalize understanding of why she is being admitted to the hospital and she states,  no one ever told me that or that I was being admitted, I know why I came in but I dont know why they kept me. This nurse informed pt that cardiology is consulted on her care and staying NPO until they are able to speak with her is important regarding the plan of care. Pt restates I am not having anything done,  and does not want to wait to speak with day shift care team, again the pt states get me some coffee and ice water, at the end of the day its my decision, this nurse responded, it is your decision but being fully aware and informed regarding what is going on is important in making that decision, and I want to make sure you understand how eating or drinking now can affect the care you receive today.  Pt again verbalized understanding, this nurse not comfortable giving pt food or drink by mouth at this time, and will notify oncoming RN.

## 2024-06-13 NOTE — Progress Notes (Signed)
 Patient is transferring to [facility], [address], room 3. [facility] is here to pick up patient, [facility], previous RN called report. Discharge packet was given to [facility]. Heparin drip is infusing. Patient belongings was given to patient. Plan of care continued.

## 2024-06-13 NOTE — ED Notes (Addendum)
 Provider Jesus, NP, notified that pt reports I dont want anything done today that would require NPO status, I would like some coffee and ice water please, pt informed that day shift would be here to talk with her about possible options for care and once informed, NPO status would be lifted if that was still her decision. Erminio Jesus, NP, states the pt needs to speak with cardiology and oncoming providers to make an informed decision regarding treatment and no orders will be changed at this time

## 2024-06-13 NOTE — H&P (View-Only) (Signed)
 Rounding Note   Patient Name: Stacey Sandoval Date of Encounter: 06/13/2024  [facility] Cardiologist: COne  Subjective Presenting with chest pain, elevated troponin Last troponin 700 Denies chest pain this morning Declining cardiac catheterization, more interested in eating Noncommittal on whether she actually wants the cardiac catheterization,  not today  Echocardiogram performed yesterday with EF 60 to 65%, grade 2 diastolic dysfunction, no focal wall motion abnormality  Scheduled Meds:  aspirin EC  81 mg Oral Daily   atorvastatin   40 mg Oral Daily   budesonide -glycopyrrolate-formoterol  2 puff Inhalation BID   gabapentin   300 mg Oral q AM   gabapentin   600 mg Oral QHS   mirtazapine  45 mg Oral QHS   pantoprazole  40 mg Oral Daily   potassium chloride   40 mEq Oral Once   Continuous Infusions:  heparin 800 Units/hr (06/13/24 0558)   magnesium  sulfate bolus IVPB     PRN Meds: albuterol    Vital Signs  Vitals:   06/13/24 0451 06/13/24 0645 06/13/24 0925 06/13/24 1010  BP:  114/73 (!) 127/55   Pulse: (!) 108 81 90   Resp: (!) 29 20 (!) 23   Temp: (!) 97.4 F (36.3 C)   97.9 F (36.6 C)  TempSrc: Oral   Axillary  SpO2: 100% 100% 97%   Weight:      Height:       No intake or output data in the 24 hours ending 06/13/24 1047    06/12/2024    6:14 AM 06/05/2023    2:15 PM 05/31/2023    2:05 PM  Last 3 Weights  Weight (lbs) 161 lb 4.8 oz 155 lb 2 oz 155 lb 3.2 oz  Weight (kg) 73.165 kg 70.364 kg 70.398 kg      Telemetry Normal sinus rhythm- Personally Reviewed  ECG   - Personally Reviewed  Physical Exam  GEN: No acute distress.   Neck: No JVD Cardiac: RRR, no murmurs, rubs, or gallops.  Respiratory: Clear to auscultation bilaterally. GI: Soft, nontender, non-distended  MS: No edema; No deformity. Neuro:  Nonfocal  Psych: Normal affect   Labs High Sensitivity Troponin:   Recent Labs  Lab 06/12/24 0627 06/12/24 0828 06/13/24 0458   TROPONINIHS 25* 479* 707*     Chemistry Recent Labs  Lab 06/12/24 0627 06/13/24 0458  NA 135 138  K 3.3* 3.5  CL 94* 100  CO2 30 32  GLUCOSE 103* 79  BUN 13 8  CREATININE 0.84 0.62  CALCIUM  8.2* 7.6*  MG  --  1.7  PROT 6.3* 5.9*  ALBUMIN 3.5 3.3*  AST 23 28  ALT 17 15  ALKPHOS 35* 39  BILITOT 0.4 0.4  GFRNONAA >60 >60  ANIONGAP 11 6    Lipids No results for input(s): CHOL, TRIG, HDL, LABVLDL, LDLCALC, CHOLHDL in the last 168 hours.  Hematology Recent Labs  Lab 06/12/24 0627 06/13/24 0458  WBC 5.5 6.3  RBC 4.36 4.14  HGB 12.7 11.9*  HCT 40.8 39.9  MCV 93.6 96.4  MCH 29.1 28.7  MCHC 31.1 29.8*  RDW 14.6 14.4  PLT 300 264   Thyroid  No results for input(s): TSH, FREET4 in the last 168 hours.  BNPNo results for input(s): BNP, PROBNP in the last 168 hours.  DDimer No results for input(s): DDIMER in the last 168 hours.   Radiology  ECHOCARDIOGRAM COMPLETE Result Date: 06/12/2024    ECHOCARDIOGRAM REPORT   Patient Name:   Stacey Sandoval Date of Exam: 06/12/2024  Medical Rec #:  [id number]       Height:       60.0 in Accession #:    [id number]      Weight:       161.3 lb Date of Birth:  [birth year]       BSA:          1.704 m Patient Age:    73 years        BP:           103/53 mmHg Patient Gender: F               HR:           66 bpm. Exam Location:  [facility] Procedure: 2D Echo, Cardiac Doppler and Color Doppler (Both Spectral and Color            Flow Doppler were utilized during procedure). Indications:     Chest pain R07.9  History:         Patient has prior history of Echocardiogram examinations, most                  recent 03/09/2022. CHF, COPD; Arrythmias:Atrial Fibrillation.                  Tobacco use.  Sonographer:     Stacey Sandoval Referring Phys:  [id number] Stacey Sandoval Diagnosing Phys: Stacey CAVE MD IMPRESSIONS  1. Left ventricular ejection fraction, by estimation, is 60 to 65%. The left ventricle has normal function. The left ventricle has no  regional wall motion abnormalities. There is mild left ventricular hypertrophy of the basal-septal segment. Left ventricular diastolic parameters are consistent with Grade II diastolic dysfunction (pseudonormalization).  2. Right ventricular systolic function is normal. The right ventricular size is normal.  3. Left atrial size was mild to moderately dilated.  4. The mitral valve is normal in structure. Mild mitral valve regurgitation.  5. The aortic valve was not well visualized. Aortic valve regurgitation is not visualized. Aortic valve sclerosis is present, with no evidence of aortic valve stenosis. Aortic valve mean gradient measures 7.0 mmHg. FINDINGS  Left Ventricle: Left ventricular ejection fraction, by estimation, is 60 to 65%. The left ventricle has normal function. The left ventricle has no regional wall motion abnormalities. The left ventricular internal cavity size was normal in size. There is  mild left ventricular hypertrophy of the basal-septal segment. Left ventricular diastolic parameters are consistent with Grade II diastolic dysfunction (pseudonormalization). Right Ventricle: The right ventricular size is normal. No increase in right ventricular wall thickness. Right ventricular systolic function is normal. Left Atrium: Left atrial size was mild to moderately dilated. Right Atrium: Right atrial size was normal in size. Pericardium: There is no evidence of pericardial effusion. Mitral Valve: The mitral valve is normal in structure. Mild mitral valve regurgitation. MV peak gradient, 14.0 mmHg. The mean mitral valve gradient is 6.0 mmHg. Tricuspid Valve: The tricuspid valve is normal in structure. Tricuspid valve regurgitation is not demonstrated. Aortic Valve: The aortic valve was not well visualized. Aortic valve regurgitation is not visualized. Aortic valve sclerosis is present, with no evidence of aortic valve stenosis. Aortic valve mean gradient measures 7.0 mmHg. Aortic valve peak gradient  measures 12.2 mmHg. Aortic valve area, by VTI measures 2.72 cm. Pulmonic Valve: The pulmonic valve was not well visualized. Pulmonic valve regurgitation is mild. Aorta: The aortic root is normal in size and structure. Venous: The inferior vena cava was not well visualized. IAS/Shunts: No atrial level  shunt detected by color flow Doppler.  LEFT VENTRICLE PLAX 2D LVIDd:         3.90 cm   Diastology LVIDs:         2.50 cm   LV e' medial:    6.42 cm/s LV PW:         0.90 cm   LV E/e' medial:  24.6 LV IVS:        1.20 cm   LV e' lateral:   8.70 cm/s LVOT diam:     2.00 cm   LV E/e' lateral: 18.2 LV SV:         116 LV SV Index:   68 LVOT Area:     3.14 cm  RIGHT VENTRICLE RV Basal diam:  2.60 cm RV Mid diam:    2.10 cm LEFT ATRIUM             Index        RIGHT ATRIUM           Index LA diam:        4.60 cm 2.70 cm/m   RA Area:     11.50 cm LA Vol (A2C):   69.9 ml 41.03 ml/m  RA Volume:   19.40 ml  11.39 ml/m LA Vol (A4C):   63.1 ml 37.04 ml/m LA Biplane Vol: 68.2 ml 40.03 ml/m  AORTIC VALVE AV Area (Vmax):    2.60 cm AV Area (Vmean):   2.94 cm AV Area (VTI):     2.72 cm AV Vmax:           175.00 cm/s AV Vmean:          125.000 cm/s AV VTI:            0.427 m AV Peak Grad:      12.2 mmHg AV Mean Grad:      7.0 mmHg LVOT Vmax:         145.00 cm/s LVOT Vmean:        117.000 cm/s LVOT VTI:          0.370 m LVOT/AV VTI ratio: 0.87  AORTA Ao Root diam: 2.80 cm MITRAL VALVE                TRICUSPID VALVE MV Area (PHT): 1.87 cm     TR Peak grad:   24.0 mmHg MV Area VTI:   2.02 cm     TR Vmax:        245.00 cm/s MV Peak grad:  14.0 mmHg MV Mean grad:  6.0 mmHg     SHUNTS MV Vmax:       1.87 m/s     Systemic VTI:  0.37 m MV Vmean:      114.0 cm/s   Systemic Diam: 2.00 cm MV Decel Time: 406 msec MV E velocity: 158.00 cm/s MV A velocity: 123.00 cm/s MV E/A ratio:  1.28 Stacey Cave MD Electronically signed by Stacey Cave MD Signature Date/Time: 06/12/2024/2:57:05 PM    Final    CT Angio Chest PE W and/or Wo  Contrast Result Date: 06/12/2024 CLINICAL DATA:  Central chest pain, severe indigestion. EXAM: CT ANGIOGRAPHY CHEST WITH CONTRAST TECHNIQUE: Multidetector CT imaging of the chest was performed using the standard protocol during bolus administration of intravenous contrast. Multiplanar CT image reconstructions and MIPs were obtained to evaluate the vascular anatomy. RADIATION DOSE REDUCTION: This exam was performed according to the departmental dose-optimization program which includes automated exposure control, adjustment of the mA and/or kV  according to patient size and/or use of iterative reconstruction technique. CONTRAST:  75mL OMNIPAQUE  IOHEXOL  350 MG/ML SOLN COMPARISON:  05/16/2023. FINDINGS: Cardiovascular: Negative for pulmonary embolus. Atherosclerotic calcification of the aorta, aortic valve and coronary arteries. Enlarged pulmonic trunk and heart. Ascending aorta measures 3.8 cm (coronal image 43). No pericardial effusion. Mediastinum/Nodes: Thyroidectomy. No pathologically enlarged mediastinal, hilar or axillary lymph nodes. Esophagus is grossly unremarkable. Lungs/Pleura: Image quality is degraded by expiratory phase imaging. Minimal dependent atelectasis. Scarring in the medial apex of the left upper lobe. Additional scarring in the lingula. No pleural fluid. Airway is unremarkable. Upper Abdomen: Cholecystectomy. Small hiatal hernia. Visualized portions of the liver, adrenal glands, kidneys, spleen, pancreas, stomach and bowel are otherwise grossly unremarkable. No upper abdominal adenopathy. Musculoskeletal: Degenerative changes in the spine. Review of the MIP images confirms the above findings. IMPRESSION: 1. Negative for pulmonary embolus. 2. Aortic atherosclerosis (ICD10-I70.0). Coronary artery calcification. 3. Enlarged pulmonic trunk, indicative of pulmonary arterial hypertension. Electronically Signed   By: Newell Eke M.D.   On: 06/12/2024 10:32   DG Chest Port 1 View Result Date:  06/12/2024 CLINICAL DATA:  Chest pain EXAM: PORTABLE CHEST 1 VIEW COMPARISON:  04/01/2022 FINDINGS: The cardio pericardial silhouette is enlarged. Left base collapse/consolidation noted peripherally with probable small left effusion. Right lung clear. Interstitial markings are diffusely coarsened with chronic features. No acute bony abnormality. Telemetry leads overlie the chest. IMPRESSION: Left base collapse/consolidation with probable small left effusion. Electronically Signed   By: Camellia Candle M.D.   On: 06/12/2024 07:07    Cardiac Studies   Patient Profile   Ms. 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 Who presents for angina, elevated troponin consistent with non-STEMI  Assessment & Plan  Non-STEMI Admitted to the hospital July 2 with chest pain, elevated troponin started on aspirin and heparin per ACS protocol Risk factors include long smoking history, hyperlipidemia -Eliquis  held in preparation for cardiac catheterization today, is on the schedule for 1:00 - Waking up this morning demanding water and coffee - Declining cardiac catheterization for no particular reason,  I do not feel like it - Requesting food - Long discussion by several physicians and nursing concerning elevated troponin, high risk of underlying ischemia.  Again declined cardiac catheterization - Cardiac catheterization called, she will be taken off the schedule, continue heparin for now continue 48 hours  Essential hypertension We will recommend we restart her bisoprolol  5 mg twice daily - Given borderline low blood pressure will hold diltiazem   Hyperlipidemia Continue Lipitor 40 daily  Paroxysmal  atrial fibrillation Continue bisoprolol  as above, diltiazem  on hold given low blood pressure, Eliquis  on hold, heparin infusion instead as above   For questions or updates, please contact [facility] Please consult [email or web address] for contact info under     Signed, Caitlyn Buchanan, MD  06/13/2024, 10:47 AM

## 2024-06-14 ENCOUNTER — Other Ambulatory Visit: Payer: Self-pay

## 2024-06-14 ENCOUNTER — Encounter (HOSPITAL_COMMUNITY): Payer: Self-pay | Admitting: Cardiology

## 2024-06-14 DIAGNOSIS — J449 Chronic obstructive pulmonary disease, unspecified: Secondary | ICD-10-CM

## 2024-06-14 DIAGNOSIS — I214 Non-ST elevation (NSTEMI) myocardial infarction: Secondary | ICD-10-CM | POA: Diagnosis not present

## 2024-06-14 DIAGNOSIS — Z720 Tobacco use: Secondary | ICD-10-CM | POA: Diagnosis not present

## 2024-06-14 DIAGNOSIS — I5032 Chronic diastolic (congestive) heart failure: Secondary | ICD-10-CM | POA: Diagnosis not present

## 2024-06-14 DIAGNOSIS — I480 Paroxysmal atrial fibrillation: Secondary | ICD-10-CM

## 2024-06-14 DIAGNOSIS — E782 Mixed hyperlipidemia: Secondary | ICD-10-CM

## 2024-06-14 LAB — BASIC METABOLIC PANEL WITH GFR
Anion gap: 4 — ABNORMAL LOW (ref 5–15)
BUN: 6 mg/dL — ABNORMAL LOW (ref 8–23)
CO2: 30 mmol/L (ref 22–32)
Calcium: 7.5 mg/dL — ABNORMAL LOW (ref 8.9–10.3)
Chloride: 103 mmol/L (ref 98–111)
Creatinine, Ser: 0.66 mg/dL (ref 0.44–1.00)
GFR, Estimated: >60 mL/min (ref >60)
Glucose, Bld: 104 mg/dL — ABNORMAL HIGH (ref 70–99)
Potassium: 4 mmol/L (ref 3.5–5.1)
Sodium: 137 mmol/L (ref 135–145)

## 2024-06-14 LAB — APTT
aPTT: 129 s — ABNORMAL HIGH (ref 24–36)
aPTT: 100 s — ABNORMAL HIGH (ref 24–36)

## 2024-06-14 LAB — HEPARIN LEVEL (UNFRACTIONATED)
Heparin Unfractionated: 0.76 [IU]/mL — ABNORMAL HIGH (ref 0.30–0.70)
Heparin Unfractionated: 0.77 [IU]/mL — ABNORMAL HIGH (ref 0.30–0.70)

## 2024-06-14 LAB — LIPID PANEL
Cholesterol: 110 mg/dL (ref 0–200)
HDL: 49 mg/dL (ref >40)
LDL Cholesterol: 49 mg/dL (ref 0–99)
Total CHOL/HDL Ratio: 2.2 ratio
Triglycerides: 60 mg/dL (ref <150)
VLDL: 12 mg/dL (ref 0–40)

## 2024-06-14 MED ORDER — DILTIAZEM HCL 30 MG PO TABS
30.0000 mg | ORAL_TABLET | ORAL | Status: AC
  Administered 2024-06-14: 30 mg via ORAL
  Filled 2024-06-14: qty 1

## 2024-06-14 MED ORDER — ORAL CARE MOUTH RINSE: 15.0000 mL | OROMUCOSAL | Status: DC | PRN

## 2024-06-14 MED ORDER — DILTIAZEM HCL ER COATED BEADS 180 MG PO CP24
180.0000 mg | ORAL_CAPSULE | Freq: Every day | ORAL | Status: DC
  Administered 2024-06-14 – 2024-06-20 (×7): 180 mg via ORAL
  Filled 2024-06-14 (×7): qty 1

## 2024-06-14 NOTE — Progress Notes (Signed)
 PHARMACY - ANTICOAGULATION CONSULT NOTE  Pharmacy Consult for heparin  gtt Indication: chest pain/ACS  No Known Allergies  Patient Measurements: Height: 5' 1 (154.9 cm) Weight: 71.3 kg (157 lb 1.6 oz) IBW/kg (Calculated) : 47.8 HEPARIN  DW (KG): 63.2  Vital Signs: Temp: 98.3 F (36.8 C) (07/04 1215) Temp Source: Oral (07/04 1215) BP: 123/53 (07/04 1215) Pulse Rate: 101 (07/04 1215)  Labs: Recent Labs     0000 06/12/24 0627 06/12/24 0828 06/12/24 0931 06/12/24 1933 06/13/24 0458 06/14/24 0357 06/14/24 1438  HGB  --  12.7  --   --   --  11.9*  --   --   HCT  --  40.8  --   --   --  39.9  --   --   PLT  --  300  --   --   --  264  --   --   APTT  --   --   --  32   < > 114* 129* 100*  LABPROT  --   --   --  15.3*  --   --   --   --   INR  --   --   --  1.1  --   --   --   --   HEPARINUNFRC   < >  --   --  >1.10*  --  >1.10* 0.76* 0.77*  CREATININE  --  0.84  --   --   --  0.62 0.66  --   TROPONINIHS  --  25* 479*  --   --  707*  --   --    < > = values in this interval not displayed.    Estimated Creatinine Clearance: 54.9 mL/min (by C-G formula based on SCr of 0.66 mg/dL).   Assessment: 73 yoF on apixaban  PTA for AF admitted for NSTEMI. Pt on IV heparin , planning staged PCI next week.  Heparin  level still high from DOAC use, aPTT is therapeutic at 100 seconds. Will reduce slightly since its on the upper end of range.   Goal of Therapy:  Heparin  level 0.3-0.7 units/ml aPTT 66-102 seconds Monitor platelets by anticoagulation protocol: Yes   Plan:  Decrease heparin  infusion to 650 units/h Repeat aPTT, heparin  level in am  Ozell Jamaica, PharmD, [place], [facility] Clinical Pharmacist [phone] Please check AMION for all [facility] numbers 06/14/2024

## 2024-06-14 NOTE — Progress Notes (Signed)
   Rounding Note    Patient Name: Stacey Sandoval Date of Encounter: 06/14/2024  [facility] Cardiologist: Dr. Darliss  Subjective   No acute events overnight. Breathing stable. No pain at cath site. Reviewed plans for atherectomy on 7/7.  Inpatient Medications    Scheduled Meds:  aspirin  EC  81 mg Oral Daily   atorvastatin   40 mg Oral Daily   bisoprolol   5 mg Oral Daily   budesonide -glycopyrrolate -formoterol   2 puff Inhalation BID   [START ON 06/15/2024] clopidogrel   75 mg Oral Daily   gabapentin   300 mg Oral q morning   gabapentin   600 mg Oral QHS   mirtazapine   45 mg Oral QHS   pantoprazole   40 mg Oral Daily   Continuous Infusions:  heparin  700 Units/hr (06/14/24 0607)   PRN Meds: acetaminophen , nitroGLYCERIN , ondansetron  (ZOFRAN ) IV, mouth rinse   Vital Signs    Vitals:   06/13/24 2100 06/13/24 2355 06/14/24 0810 06/14/24 0822  BP: (!) 122/47 (!) 113/49 116/69   Pulse:  100 (!) 102   Resp:  18 18 18   Temp:  98 F (36.7 C) 97.6 F (36.4 C)   TempSrc:  Oral Oral   SpO2:  95% 100% 96%  Weight:      Height:        Intake/Output Summary (Last 24 hours) at 06/14/2024 0953 Last data filed at 06/14/2024 0810 Gross per 24 hour  Intake 240 ml  Output --  Net 240 ml      06/13/2024    8:53 PM 06/12/2024    6:14 AM 06/05/2023    2:15 PM  Last 3 Weights  Weight (lbs) 157 lb 1.6 oz 161 lb 4.8 oz 155 lb 2 oz  Weight (kg) 71.26 kg 73.165 kg 70.364 kg      Telemetry    SR - Personally Reviewed  Physical Exam   GEN: No acute distress.  On O2 by [place] Neck: No JVD Cardiac: RRR, no murmurs, rubs, or gallops.  Respiratory: Clear to auscultation bilaterally except for scattered rhonchi that improve with cough GI: Soft, nontender, non-distended  MS: No edema; No deformity. Neuro:  Nonfocal  Psych: Normal affect   New pertinent results (labs, ECG, imaging, cardiac studies)    Cath, echo images personally reviewed  Assessment & Plan     NSTEMI CAD Mixed hyperlipidemia -s/p cardiac cath; prox RCA heavily calcified with 95% stenosis, with additional disease as noted -transferred to [facility] for plans for atherectomy and PCI on 7/7 -discussed antiplatelet with Dr. Anner. Given long term need for DOAC, loaded with clopidogrel  rather than brilinta  -continue IV heparin  -continue atorvastatin . LDL at goal of 49. TG well controlled at 60 -lp(a) pending  Paroxysmal atrial fibrillation -CHA2DS2/VAS Stroke Risk Points = 6  -on apixaban  as outpatient -has been in sinus rhythm here -continue bisoprolol  -holding home diltiazem   Chronic diastolic heart failure -EF 60-65%, euvolemic on exam -continue bisoprolol  -holding home lasix  currently  Tobacco use COPD on PRN home O2 -needs cessation of smoking    Signed, Shelda Bruckner, MD  06/14/2024, 9:53 AM

## 2024-06-14 NOTE — Plan of Care (Signed)

## 2024-06-14 NOTE — Progress Notes (Signed)
 PHARMACY - ANTICOAGULATION CONSULT NOTE  Pharmacy Consult for heparin  gtt Indication: chest pain/ACS  No Known Allergies  Patient Measurements: Height: 5' 1 (154.9 cm) Weight: 71.3 kg (157 lb 1.6 oz) IBW/kg (Calculated) : 47.8 HEPARIN  DW (KG): 63.2  Vital Signs: Temp: 98 F (36.7 C) (07/03 2355) Temp Source: Oral (07/03 2355) BP: 113/49 (07/03 2355) Pulse Rate: 100 (07/03 2355)  Labs: Recent Labs    06/12/24 0627 06/12/24 0828 06/12/24 0931 06/12/24 0931 06/12/24 1933 06/13/24 0458 06/14/24 0357  HGB 12.7  --   --   --   --  11.9*  --   HCT 40.8  --   --   --   --  39.9  --   PLT 300  --   --   --   --  264  --   APTT  --   --  32   < > 83* 114* 129*  LABPROT  --   --  15.3*  --   --   --   --   INR  --   --  1.1  --   --   --   --   HEPARINUNFRC  --   --  >1.10*  --   --  >1.10* 0.76*  CREATININE 0.84  --   --   --   --  0.62 0.66  TROPONINIHS 25* 479*  --   --   --  707*  --    < > = values in this interval not displayed.    Estimated Creatinine Clearance: 54.9 mL/min (by C-G formula based on SCr of 0.66 mg/dL).   Assessment: 21 YOF with a PMH significant for pAF on apixaban  PTA. Last dose 7/1 @2200  Heparin  consult for ACS received  She was transferred from [facility] where heparin  gtt was initiated. She arrived and it was infusing. Started ~1900 this evening  AM: aPTT 129 seconds and heparin  level 0.76 on 800 units/hr (~correlated). Per RN, drawn appropriately on opposite extremity of heparin  gtt and no signs/symptoms of bleeding  Goal of Therapy:  Heparin  level 0.3-0.7 units/ml aPTT 66-102 seconds Monitor platelets by anticoagulation protocol: Yes   Plan:  Decrease heparin  infusion to 700 units/hr Check heparin  level and aPTT in 8 hours and daily while on heparin  May DC aPTT once correlates with HL Continue to monitor H&H and platelets  Lynwood Poplar, PharmD, BCPS Clinical Pharmacist 06/14/2024 5:54 AM

## 2024-06-15 ENCOUNTER — Inpatient Hospital Stay (HOSPITAL_COMMUNITY)

## 2024-06-15 DIAGNOSIS — I214 Non-ST elevation (NSTEMI) myocardial infarction: Secondary | ICD-10-CM | POA: Diagnosis not present

## 2024-06-15 LAB — BASIC METABOLIC PANEL WITH GFR
Anion gap: 11 (ref 5–15)
BUN: 5 mg/dL — ABNORMAL LOW (ref 8–23)
CO2: 29 mmol/L (ref 22–32)
Calcium: 8.3 mg/dL — ABNORMAL LOW (ref 8.9–10.3)
Chloride: 98 mmol/L (ref 98–111)
Creatinine, Ser: 0.8 mg/dL (ref 0.44–1.00)
GFR, Estimated: >60 mL/min (ref >60)
Glucose, Bld: 91 mg/dL (ref 70–99)
Potassium: 3.7 mmol/L (ref 3.5–5.1)
Sodium: 138 mmol/L (ref 135–145)

## 2024-06-15 LAB — BLOOD GAS, VENOUS
Acid-Base Excess: 5.1 mmol/L — ABNORMAL HIGH (ref 0.0–2.0)
Acid-Base Excess: 10 mmol/L — ABNORMAL HIGH (ref 0.0–2.0)
Bicarbonate: 34.1 mmol/L — ABNORMAL HIGH (ref 20.0–28.0)
Bicarbonate: 38.8 mmol/L — ABNORMAL HIGH (ref 20.0–28.0)
Drawn by: 5280
O2 Saturation: 93.4 %
O2 Saturation: 73.6 %
Patient temperature: 37
Patient temperature: 36.9
pCO2, Ven: 71 mmHg (ref 44–60)
pCO2, Ven: 72 mmHg (ref 44–60)
pH, Ven: 7.29 (ref 7.25–7.43)
pH, Ven: 7.34 (ref 7.25–7.43)
pO2, Ven: 64 mmHg — ABNORMAL HIGH (ref 32–45)
pO2, Ven: 41 mmHg (ref 32–45)

## 2024-06-15 LAB — CBC
HCT: 35.6 % — ABNORMAL LOW (ref 36.0–46.0)
Hemoglobin: 10.9 g/dL — ABNORMAL LOW (ref 12.0–15.0)
MCH: 29.6 pg (ref 26.0–34.0)
MCHC: 30.6 g/dL (ref 30.0–36.0)
MCV: 96.7 fL (ref 80.0–100.0)
Platelets: 223 K/uL (ref 150–400)
RBC: 3.68 MIL/uL — ABNORMAL LOW (ref 3.87–5.11)
RDW: 14.6 % (ref 11.5–15.5)
WBC: 7.3 K/uL (ref 4.0–10.5)
nRBC: 0 % (ref 0.0–0.2)

## 2024-06-15 LAB — URINALYSIS, ROUTINE W REFLEX MICROSCOPIC
Bilirubin Urine: NEGATIVE
Glucose, UA: NEGATIVE mg/dL
Hgb urine dipstick: NEGATIVE
Ketones, ur: NEGATIVE mg/dL
Leukocytes,Ua: NEGATIVE
Nitrite: NEGATIVE
Protein, ur: NEGATIVE mg/dL
Specific Gravity, Urine: 1.004 — ABNORMAL LOW (ref 1.005–1.030)
pH: 6 (ref 5.0–8.0)

## 2024-06-15 LAB — HEPARIN LEVEL (UNFRACTIONATED): Heparin Unfractionated: 0.37 [IU]/mL (ref 0.30–0.70)

## 2024-06-15 LAB — APTT: aPTT: 69 s — ABNORMAL HIGH (ref 24–36)

## 2024-06-15 LAB — BRAIN NATRIURETIC PEPTIDE: B Natriuretic Peptide: 625.6 pg/mL — ABNORMAL HIGH (ref 0.0–100.0)

## 2024-06-15 MED ORDER — FUROSEMIDE 10 MG/ML IJ SOLN
40.0000 mg | Freq: Once | INTRAMUSCULAR | Status: AC
  Administered 2024-06-15: 40 mg via INTRAVENOUS
  Filled 2024-06-15: qty 4

## 2024-06-15 MED ORDER — IPRATROPIUM-ALBUTEROL 0.5-2.5 (3) MG/3ML IN SOLN: 3.0000 mL | Freq: Four times a day (QID) | RESPIRATORY_TRACT | Status: DC | PRN

## 2024-06-15 NOTE — H&P (Addendum)
 Patient ID: Stacey Sandoval MRN: [id number]; DOB: 1951/04/12  Admit date: 06/13/2024 Date of Consult: 06/15/2024  PCP:  [facility] Providers Cardiologist:  None        HPI: Stacey Sandoval is a 73 y.o. female with a hx of paroxysmal atrial fibrillation, COPD, HFpEF, current smoker x 40+ years  who initially presented to [facility] on 7/2 with a chief complaint of chest pain. Patient found to have NSTEMI. Underwent LHC with Dr. Anner and found to have prox RCA heavily calcified with 95% stenosis. No intervention performed. She was transferred to [facility] on 7/3 with plans for repeat catheterization with atherectomy on Monday, 7/7. Patient has no new or acute complaints currently. Continues to have some fatigue. No chest pain.  Interval:  No acute overnight events. Patient reports feeling relatively well. No new or acute complaints.    Past Medical History:  Diagnosis Date   Atrial fibrillation (HCC)    CHF (congestive heart failure) (HCC)    Complication of anesthesia    VERY slow to wake after hysterectomy   COPD (chronic obstructive pulmonary disease) (HCC)    Enlarged thyroid     has been referred to [facility]   Hyperlipidemia    Hypertension    Smokers' cough (HCC)    Tobacco use    Wears dentures    Full upper and lower    Past Surgical History:  Procedure Laterality Date   ABDOMINAL HYSTERECTOMY  1990   BREAST LUMPECTOMY Right 1974   CATARACT EXTRACTION W/PHACO Right 06/21/2022   Procedure: CATARACT EXTRACTION PHACO AND INTRAOCULAR LENS PLACEMENT (IOC) RIGHT 9.09 00:51.1;  Surgeon: Jaye Fallow, MD;  Location: [facility];  Service: Ophthalmology;  Laterality: Right;   CATARACT EXTRACTION W/PHACO Left 07/05/2022   Procedure: CATARACT EXTRACTION PHACO AND INTRAOCULAR LENS PLACEMENT (IOC) LEFT 7.05 00:45.9;  Surgeon: Jaye Fallow, MD;  Location: [facility];  Service: Ophthalmology;  Laterality: Left;   CHOLECYSTECTOMY   2013      Scheduled Meds:  aspirin  EC  81 mg Oral Daily   atorvastatin   40 mg Oral Daily   bisoprolol   5 mg Oral Daily   budesonide -glycopyrrolate -formoterol   2 puff Inhalation BID   clopidogrel   75 mg Oral Daily   diltiazem   180 mg Oral QHS   gabapentin   300 mg Oral q morning   gabapentin   600 mg Oral QHS   mirtazapine   45 mg Oral QHS   pantoprazole   40 mg Oral Daily   Continuous Infusions:  heparin  650 Units/hr (06/15/24 0318)   PRN Meds: acetaminophen , nitroGLYCERIN , ondansetron  (ZOFRAN ) IV, mouth rinse  Allergies:   No Known Allergies  Social History:   Social History   Socioeconomic History   Marital status: Widowed    Spouse name: Not on file   Number of children: Not on file   Years of education: Not on file   Highest education level: Not on file  Occupational History   Not on file  Tobacco Use   Smoking status: Every Day    Current packs/day: 0.25    Average packs/day: 0.3 packs/day for 40.0 years (10.0 ttl pk-yrs)    Types: Cigarettes   Smokeless tobacco: Never   Tobacco comments:    0.5PPD 05/31/2023  Vaping Use   Vaping status: Never Used  Substance and Sexual Activity   Alcohol  use: Yes    Alcohol /week: 7.0 standard drinks of alcohol     Types: 7 Glasses of wine per week  Comment: one glass of wine daily after dinner   Drug use: Never   Sexual activity: Not Currently  Other Topics Concern   Not on file  Social History Narrative   Not on file   Social Drivers of Health   Financial Resource Strain: Low Risk  (09/01/2023)   Received from [facility]   Overall Financial Resource Strain (CARDIA)    Difficulty of Paying Living Expenses: Not hard at all  Food Insecurity: No Food Insecurity (06/14/2024)   Hunger Vital Sign    Worried About Running Out of Food in the Last Year: Never true    Ran Out of Food in the Last Year: Never true  Transportation Needs: No Transportation Needs (06/14/2024)   PRAPARE - Scientist, research (physical sciences) (Medical): No    Lack of Transportation (Non-Medical): No  Physical Activity: Not on file  Stress: Not on file  Social Connections: Moderately Isolated (06/14/2024)   Social Connection and Isolation Panel    Frequency of Communication with Friends and Family: More than three times a week    Frequency of Social Gatherings with Friends and Family: More than three times a week    Attends Religious Services: More than 4 times per year    Active Member of [employer] or Organizations: No    Attends Banker Meetings: Never    Marital Status: Widowed  Intimate Partner Violence: Not At Risk (06/14/2024)   Humiliation, Afraid, Rape, and Kick questionnaire    Fear of Current or Ex-Partner: No    Emotionally Abused: No    Physically Abused: No    Sexually Abused: No    Family History:   Family History  Problem Relation Age of Onset   Hypertension Mother    Heart disease Mother        CABG   Heart disease Father    Heart attack Father      ROS:  Please see the history of present illness.   All other ROS reviewed and negative.     Physical Exam/Data: Vitals:   06/15/24 0436 06/15/24 0804 06/15/24 0836 06/15/24 1215  BP: (!) 127/47 130/61  (!) 110/49  Pulse: 88     Resp: 17 17 16 15   Temp: 98.1 F (36.7 C) 99.6 F (37.6 C)  99.6 F (37.6 C)  TempSrc: Oral Oral  Oral  SpO2: 93% 91%    Weight:      Height:        Intake/Output Summary (Last 24 hours) at 06/15/2024 1223 Last data filed at 06/15/2024 0400 Gross per 24 hour  Intake 503.4 ml  Output --  Net 503.4 ml      06/13/2024    8:53 PM 06/12/2024    6:14 AM 06/05/2023    2:15 PM  Last 3 Weights  Weight (lbs) 157 lb 1.6 oz 161 lb 4.8 oz 155 lb 2 oz  Weight (kg) 71.26 kg 73.165 kg 70.364 kg     Body mass index is 29.68 kg/m.   General: Well developed, in no acute distress.  Neck: No JVD.  Cardiac: Normal rate, regular rhythm.  Resp: Normal work of breathing.  Ext: No edema.  Neuro: No gross focal  deficits.  Psych: Normal affect.   EKG:  The EKG was personally reviewed and demonstrates:  Sinus tach, ST depression Telemetry:  Telemetry was personally reviewed and demonstrates:  SR  Relevant CV Studies: LHC 06/13/24:    Prox RCA lesion is 95% stenosed.  Heavily calcified focal lesion   Prox RCA to Mid RCA lesion is 85% stenosed with 90% stenosed side branch in Acute Mrg.  Moderately calcified involving large RV marginal branch.   Mid RCA lesion is 50% stenosed.   Prox Cx lesion is 70% stenosed.  (Focal) at this point would consider medical management pending completion of the RCA intervention   Mid LAD to Dist LAD lesion is 30% stenosed.   LV end diastolic pressure is normal. There is no aortic valve stenosis.  Echo 06/12/24: 1. Left ventricular ejection fraction, by estimation, is 60 to 65%. The  left ventricle has normal function. The left ventricle has no regional  wall motion abnormalities. There is mild left ventricular hypertrophy of  the basal-septal segment. Left  ventricular diastolic parameters are consistent with Grade II diastolic  dysfunction (pseudonormalization).   2. Right ventricular systolic function is normal. The right ventricular  size is normal.   3. Left atrial size was mild to moderately dilated.   4. The mitral valve is normal in structure. Mild mitral valve  regurgitation.   5. The aortic valve was not well visualized. Aortic valve regurgitation  is not visualized. Aortic valve sclerosis is present, with no evidence of  aortic valve stenosis. Aortic valve mean gradient measures 7.0 mmHg.   Laboratory Data: High Sensitivity Troponin:   Recent Labs  Lab 06/12/24 0627 06/12/24 0828 06/13/24 0458  TROPONINIHS 25* 479* 707*     Chemistry Recent Labs  Lab 06/12/24 0627 06/13/24 0458 06/14/24 0357  NA 135 138 137  K 3.3* 3.5 4.0  CL 94* 100 103  CO2 30 32 30  GLUCOSE 103* 79 104*  BUN 13 8 6*  CREATININE 0.84 0.62 0.66  CALCIUM  8.2* 7.6* 7.5*   MG  --  1.7  --   GFRNONAA >60 >60 >60  ANIONGAP 11 6 4*    Recent Labs  Lab 06/12/24 0627 06/13/24 0458  PROT 6.3* 5.9*  ALBUMIN  3.5 3.3*  AST 23 28  ALT 17 15  ALKPHOS 35* 39  BILITOT 0.4 0.4   Lipids  Recent Labs  Lab 06/14/24 0357  CHOL 110  TRIG 60  HDL 49  LDLCALC 49  CHOLHDL 2.2    Hematology Recent Labs  Lab 06/12/24 0627 06/13/24 0458 06/15/24 0237  WBC 5.5 6.3 7.3  RBC 4.36 4.14 3.68*  HGB 12.7 11.9* 10.9*  HCT 40.8 39.9 35.6*  MCV 93.6 96.4 96.7  MCH 29.1 28.7 29.6  MCHC 31.1 29.8* 30.6  RDW 14.6 14.4 14.6  PLT 300 264 223   Thyroid  No results for input(s): TSH, FREET4 in the last 168 hours.  BNPNo results for input(s): BNP, PROBNP in the last 168 hours.  DDimer No results for input(s): DDIMER in the last 168 hours.  Radiology/Studies:  CARDIAC CATHETERIZATION Result Date: 06/13/2024 Images from the original result were not included.   Prox RCA lesion is 95% stenosed.  Heavily calcified focal lesion   Prox RCA to Mid RCA lesion is 85% stenosed with 90% stenosed side branch in Acute Mrg.  Moderately calcified involving large RV marginal branch.   Mid RCA lesion is 50% stenosed.   Prox Cx lesion is 70% stenosed.  (Focal) at this point would consider medical management pending completion of the RCA intervention   Mid LAD to Dist LAD lesion is 30% stenosed.   LV end diastolic pressure is normal. There is no aortic valve stenosis. Dominance: Right body RECOMMENDATIONS   Plan will be to  load with antiplatelet agent (likely Brilinta ), restart IV heparin  2 hours post TR band removal.   Will arrange transfer to [facility] with plans for atherectomy based PCI to the RCA on Monday, 06/17/2024   Recommend dual antiplatelet therapy with Aspirin  81mg  daily and Ticagrelor  90mg  twice daily long-term (beyond 12 months) because of Will likely require long stent in the RCA. Alm Clay, MD, MS Alm Clay, M.D., M.S. Interventional Cardiologist [facility] Pager # [phone]   ECHOCARDIOGRAM COMPLETE Result Date: 06/12/2024    ECHOCARDIOGRAM REPORT   Patient Name:   PATRECIA VEIGA Date of Exam: 06/12/2024 Medical Rec #:  [id number]       Height:       60.0 in Accession #:    [id number]      Weight:       161.3 lb Date of Birth:  02-08-1951       BSA:          1.704 m Patient Age:    73 years        BP:           103/53 mmHg Patient Gender: F               HR:           66 bpm. Exam Location:  [facility] Procedure: 2D Echo, Cardiac Doppler and Color Doppler (Both Spectral and Color            Flow Doppler were utilized during procedure). Indications:     Chest pain R07.9  History:         Patient has prior history of Echocardiogram examinations, most                  recent 03/09/2022. CHF, COPD; Arrythmias:Atrial Fibrillation.                  Tobacco use.  Sonographer:     Christopher Furnace Referring Phys:  [id number] REDELL CAVE Diagnosing Phys: REDELL CAVE MD IMPRESSIONS  1. Left ventricular ejection fraction, by estimation, is 60 to 65%. The left ventricle has normal function. The left ventricle has no regional wall motion abnormalities. There is mild left ventricular hypertrophy of the basal-septal segment. Left ventricular diastolic parameters are consistent with Grade II diastolic dysfunction (pseudonormalization).  2. Right ventricular systolic function is normal. The right ventricular size is normal.  3. Left atrial size was mild to moderately dilated.  4. The mitral valve is normal in structure. Mild mitral valve regurgitation.  5. The aortic valve was not well visualized. Aortic valve regurgitation is not visualized. Aortic valve sclerosis is present, with no evidence of aortic valve stenosis. Aortic valve mean gradient measures 7.0 mmHg. FINDINGS  Left Ventricle: Left ventricular ejection fraction, by estimation, is 60 to 65%. The left ventricle has normal function. The left ventricle has no regional wall motion abnormalities. The left ventricular  internal cavity size was normal in size. There is  mild left ventricular hypertrophy of the basal-septal segment. Left ventricular diastolic parameters are consistent with Grade II diastolic dysfunction (pseudonormalization). Right Ventricle: The right ventricular size is normal. No increase in right ventricular wall thickness. Right ventricular systolic function is normal. Left Atrium: Left atrial size was mild to moderately dilated. Right Atrium: Right atrial size was normal in size. Pericardium: There is no evidence of pericardial effusion. Mitral Valve: The mitral valve is normal in structure. Mild mitral valve regurgitation. MV peak gradient, 14.0 mmHg. The mean mitral  valve gradient is 6.0 mmHg. Tricuspid Valve: The tricuspid valve is normal in structure. Tricuspid valve regurgitation is not demonstrated. Aortic Valve: The aortic valve was not well visualized. Aortic valve regurgitation is not visualized. Aortic valve sclerosis is present, with no evidence of aortic valve stenosis. Aortic valve mean gradient measures 7.0 mmHg. Aortic valve peak gradient measures 12.2 mmHg. Aortic valve area, by VTI measures 2.72 cm. Pulmonic Valve: The pulmonic valve was not well visualized. Pulmonic valve regurgitation is mild. Aorta: The aortic root is normal in size and structure. Venous: The inferior vena cava was not well visualized. IAS/Shunts: No atrial level shunt detected by color flow Doppler.  LEFT VENTRICLE PLAX 2D LVIDd:         3.90 cm   Diastology LVIDs:         2.50 cm   LV e' medial:    6.42 cm/s LV PW:         0.90 cm   LV E/e' medial:  24.6 LV IVS:        1.20 cm   LV e' lateral:   8.70 cm/s LVOT diam:     2.00 cm   LV E/e' lateral: 18.2 LV SV:         116 LV SV Index:   68 LVOT Area:     3.14 cm  RIGHT VENTRICLE RV Basal diam:  2.60 cm RV Mid diam:    2.10 cm LEFT ATRIUM             Index        RIGHT ATRIUM           Index LA diam:        4.60 cm 2.70 cm/m   RA Area:     11.50 cm LA Vol (A2C):   69.9  ml 41.03 ml/m  RA Volume:   19.40 ml  11.39 ml/m LA Vol (A4C):   63.1 ml 37.04 ml/m LA Biplane Vol: 68.2 ml 40.03 ml/m  AORTIC VALVE AV Area (Vmax):    2.60 cm AV Area (Vmean):   2.94 cm AV Area (VTI):     2.72 cm AV Vmax:           175.00 cm/s AV Vmean:          125.000 cm/s AV VTI:            0.427 m AV Peak Grad:      12.2 mmHg AV Mean Grad:      7.0 mmHg LVOT Vmax:         145.00 cm/s LVOT Vmean:        117.000 cm/s LVOT VTI:          0.370 m LVOT/AV VTI ratio: 0.87  AORTA Ao Root diam: 2.80 cm MITRAL VALVE                TRICUSPID VALVE MV Area (PHT): 1.87 cm     TR Peak grad:   24.0 mmHg MV Area VTI:   2.02 cm     TR Vmax:        245.00 cm/s MV Peak grad:  14.0 mmHg MV Mean grad:  6.0 mmHg     SHUNTS MV Vmax:       1.87 m/s     Systemic VTI:  0.37 m MV Vmean:      114.0 cm/s   Systemic Diam: 2.00 cm MV Decel Time: 406 msec MV E velocity: 158.00 cm/s MV A velocity: 123.00 cm/s  MV E/A ratio:  1.28 Redell Cave MD Electronically signed by Redell Cave MD Signature Date/Time: 06/12/2024/2:57:05 PM    Final    CT Angio Chest PE W and/or Wo Contrast Result Date: 06/12/2024 CLINICAL DATA:  Central chest pain, severe indigestion. EXAM: CT ANGIOGRAPHY CHEST WITH CONTRAST TECHNIQUE: Multidetector CT imaging of the chest was performed using the standard protocol during bolus administration of intravenous contrast. Multiplanar CT image reconstructions and MIPs were obtained to evaluate the vascular anatomy. RADIATION DOSE REDUCTION: This exam was performed according to the departmental dose-optimization program which includes automated exposure control, adjustment of the mA and/or kV according to patient size and/or use of iterative reconstruction technique. CONTRAST:  75mL OMNIPAQUE  IOHEXOL  350 MG/ML SOLN COMPARISON:  05/16/2023. FINDINGS: Cardiovascular: Negative for pulmonary embolus. Atherosclerotic calcification of the aorta, aortic valve and coronary arteries. Enlarged pulmonic trunk and heart.  Ascending aorta measures 3.8 cm (coronal image 43). No pericardial effusion. Mediastinum/Nodes: Thyroidectomy. No pathologically enlarged mediastinal, hilar or axillary lymph nodes. Esophagus is grossly unremarkable. Lungs/Pleura: Image quality is degraded by expiratory phase imaging. Minimal dependent atelectasis. Scarring in the medial apex of the left upper lobe. Additional scarring in the lingula. No pleural fluid. Airway is unremarkable. Upper Abdomen: Cholecystectomy. Small hiatal hernia. Visualized portions of the liver, adrenal glands, kidneys, spleen, pancreas, stomach and bowel are otherwise grossly unremarkable. No upper abdominal adenopathy. Musculoskeletal: Degenerative changes in the spine. Review of the MIP images confirms the above findings. IMPRESSION: 1. Negative for pulmonary embolus. 2. Aortic atherosclerosis (ICD10-I70.0). Coronary artery calcification. 3. Enlarged pulmonic trunk, indicative of pulmonary arterial hypertension. Electronically Signed   By: Newell Eke M.D.   On: 06/12/2024 10:32   DG Chest Port 1 View Result Date: 06/12/2024 CLINICAL DATA:  Chest pain EXAM: PORTABLE CHEST 1 VIEW COMPARISON:  04/01/2022 FINDINGS: The cardio pericardial silhouette is enlarged. Left base collapse/consolidation noted peripherally with probable small left effusion. Right lung clear. Interstitial markings are diffusely coarsened with chronic features. No acute bony abnormality. Telemetry leads overlie the chest. IMPRESSION: Left base collapse/consolidation with probable small left effusion. Electronically Signed   By: Camellia Candle M.D.   On: 06/12/2024 07:07     Assessment and Plan: NSTEMI CAD: s/p cardiac cath; prox RCA heavily calcified with 95% stenosis, with additional disease as noted Mixed hyperlipidemia -Atherectomy and PCI on 7/7 -Antiplatelet regimen per Dr. Anner: Clopidogrel  rather than ticagrelor  given need for DOAC. -Continue IV heparin  -Continue atorvastatin . LDL at  goal of 49. TG well controlled at 60 -Lp(a) pending   Paroxysmal atrial fibrillation: CHA2DS2/VAS Stroke Risk Points = 6  -On apixaban  as outpatient, heparin  here in anticipation of LHC. -Continue bisoprolol  and diltiazem .   Chronic diastolic heart failure: EF 60-65%. Euvolemic and well compensated on exam.  -Continue bisoprolol  -Holding home lasix  currently   Tobacco use COPD on PRN home O2 -Needs cessation of smoking   For questions or updates, please contact [facility] Please consult [email or web address] for contact info under    Signed, Fonda Kitty, MD  06/15/2024 12:23 PM

## 2024-06-15 NOTE — Progress Notes (Addendum)
 Patient's daughter voiced concern about patient being confused today.  Temp has been 99.6 today(which is high for her per daughter) Spoke to Artist Pouch, [place] he states to hold off on giving tylenol  for now, new orders for Chest xray/BNP/flutter valve/incentive spirometry ordered.   Patient reassessed patient sitting on side of the bed, alert and oriented.  Patient given Incentive Spirometry.

## 2024-06-15 NOTE — Progress Notes (Addendum)
 Date and time results received: 06/15/24 1638   Test: VENOUS BLOOD GAS Critical Value: PCO2=71  Name of Provider Notified: ARTIST POUCH, PA  Orders Received? Or Actions Taken?: NO NEW ORDERS RECEIVED.

## 2024-06-15 NOTE — Progress Notes (Signed)
 PHARMACY - ANTICOAGULATION CONSULT NOTE  Pharmacy Consult for heparin  gtt Indication: chest pain/ACS  No Known Allergies  Patient Measurements: Height: 5' 1 (154.9 cm) Weight: 71.3 kg (157 lb 1.6 oz) IBW/kg (Calculated) : 47.8 HEPARIN  DW (KG): 63.2  Vital Signs: Temp: 99.6 F (37.6 C) (07/05 0804) Temp Source: Oral (07/05 0804) BP: 130/61 (07/05 0804) Pulse Rate: 88 (07/05 0436)  Labs: Recent Labs    06/13/24 0458 06/14/24 0357 06/14/24 1438 06/15/24 0237  HGB 11.9*  --   --  10.9*  HCT 39.9  --   --  35.6*  PLT 264  --   --  223  APTT 114* 129* 100* 69*  HEPARINUNFRC >1.10* 0.76* 0.77* 0.37  CREATININE 0.62 0.66  --   --   TROPONINIHS 707*  --   --   --     Estimated Creatinine Clearance: 54.9 mL/min (by C-G formula based on SCr of 0.66 mg/dL).   Assessment: 73 yoF on apixaban  PTA for AF admitted for NSTEMI. Pt on IV heparin , planning staged PCI next week.  Heparin  level 0.36 and  aPTT 69sec - now correlating and  therapeutic at on heparin  drip 650uts.hr. CBC stable no bleeding noted    Goal of Therapy:  Heparin  level 0.3-0.7 units/ml aPTT 66-102 seconds Monitor platelets by anticoagulation protocol: Yes   Plan:  Continue heparin  infusion  650 units/h Heparin  level and cbc daily    Olam Chalk Pharm.D. CPP, BCPS Clinical Pharmacist [phone] 06/15/2024 12:13 PM   Please check AMION for all [facility] numbers 06/15/2024

## 2024-06-15 NOTE — Plan of Care (Signed)
   Problem: Education: Goal: Knowledge of General Education information will improve Description Including pain rating scale, medication(s)/side effects and non-pharmacologic comfort measures Outcome: Progressing

## 2024-06-15 NOTE — Plan of Care (Signed)

## 2024-06-15 NOTE — Progress Notes (Signed)
   Patient evaluated at request of nursing staff. She was napping on my arrival to bedside but easily roused. Patient conversational without apparent confusion. Denies noticing a change in respiratory status. Denies chest pain. On auscultation, bases are diminished. End expiratory wheezing heard in bilateral upper lobes. On repeat CXR today, I note increased bibasilar opacification. As patient takes Lasix  80mg  at baseline but has not received since admission, will give 1 time 40mg  IV dose. Check BNP, VBG, UA (due to confusion reported by daughter). PRN Duoneb also ordered.  Artist Pouch, PA-C

## 2024-06-16 DIAGNOSIS — I214 Non-ST elevation (NSTEMI) myocardial infarction: Secondary | ICD-10-CM | POA: Diagnosis not present

## 2024-06-16 LAB — BASIC METABOLIC PANEL WITH GFR
Anion gap: 11 (ref 5–15)
BUN: 7 mg/dL — ABNORMAL LOW (ref 8–23)
CO2: 30 mmol/L (ref 22–32)
Calcium: 8.3 mg/dL — ABNORMAL LOW (ref 8.9–10.3)
Chloride: 98 mmol/L (ref 98–111)
Creatinine, Ser: 0.68 mg/dL (ref 0.44–1.00)
GFR, Estimated: >60 mL/min (ref >60)
Glucose, Bld: 94 mg/dL (ref 70–99)
Potassium: 4 mmol/L (ref 3.5–5.1)
Sodium: 139 mmol/L (ref 135–145)

## 2024-06-16 LAB — CBC
HCT: 39.3 % (ref 36.0–46.0)
Hemoglobin: 11.5 g/dL — ABNORMAL LOW (ref 12.0–15.0)
MCH: 28.3 pg (ref 26.0–34.0)
MCHC: 29.3 g/dL — ABNORMAL LOW (ref 30.0–36.0)
MCV: 96.8 fL (ref 80.0–100.0)
Platelets: 218 K/uL (ref 150–400)
RBC: 4.06 MIL/uL (ref 3.87–5.11)
RDW: 14.7 % (ref 11.5–15.5)
WBC: 6.6 K/uL (ref 4.0–10.5)
nRBC: 0 % (ref 0.0–0.2)

## 2024-06-16 LAB — HEPARIN LEVEL (UNFRACTIONATED): Heparin Unfractionated: 0.24 [IU]/mL — ABNORMAL LOW (ref 0.30–0.70)

## 2024-06-16 MED ORDER — FUROSEMIDE 10 MG/ML IJ SOLN
40.0000 mg | Freq: Once | INTRAMUSCULAR | Status: AC
  Administered 2024-06-16: 40 mg via INTRAVENOUS
  Filled 2024-06-16: qty 4

## 2024-06-16 MED ORDER — IPRATROPIUM-ALBUTEROL 0.5-2.5 (3) MG/3ML IN SOLN
3.0000 mL | Freq: Four times a day (QID) | RESPIRATORY_TRACT | Status: DC
  Administered 2024-06-17: 3 mL via RESPIRATORY_TRACT
  Filled 2024-06-16: qty 3

## 2024-06-16 MED ORDER — IPRATROPIUM-ALBUTEROL 0.5-2.5 (3) MG/3ML IN SOLN
3.0000 mL | RESPIRATORY_TRACT | Status: DC
  Administered 2024-06-16 (×5): 3 mL via RESPIRATORY_TRACT
  Filled 2024-06-16 (×5): qty 3

## 2024-06-16 NOTE — Progress Notes (Signed)
   06/16/24 0800  Assess: MEWS Score  Pulse Rate (!) 111  ECG Heart Rate (!) 116  SpO2 91 %  O2 Device Nasal Cannula  O2 Flow Rate (L/min) 3 L/min  Assess: MEWS Score  MEWS Temp 0  MEWS Systolic 0  MEWS Pulse 2  MEWS RR 0  MEWS LOC 0  MEWS Score 2  MEWS Score Color Yellow  Assess: if the MEWS score is Yellow or Red  Were vital signs accurate and taken at a resting state? Yes  Does the patient meet 2 or more of the SIRS criteria? No  MEWS guidelines implemented  Yes, yellow  Treat  MEWS Interventions Considered administering scheduled or prn medications/treatments as ordered  Take Vital Signs  Increase Vital Sign Frequency  Yellow: Q2hr x1, continue Q4hrs until patient remains green for 12hrs  Escalate  MEWS: Escalate Yellow: Discuss with charge nurse and consider notifying provider and/or RRT  Notify: Charge Nurse/RN  Name of Charge Nurse/RN Notified harlene Marylyn Appenzeller rn  Provider Notification  Provider Name/Title dr kennyth  Date Provider Notified 06/16/24  Time Provider Notified [phone]  Method of Notification Face-to-face  Notification Reason Other (Comment) (update on patient given to MD)  Provider response In department  Date of Provider Response 06/16/24  Time of Provider Response 0815  Assess: SIRS CRITERIA  SIRS Temperature  0  SIRS Respirations  0  SIRS Pulse 1  SIRS WBC 0  SIRS Score Sum  1

## 2024-06-16 NOTE — Plan of Care (Signed)

## 2024-06-16 NOTE — Progress Notes (Addendum)
 RN called by telemetry monitoring, Patient's sats 82-83 on 2L [facility].  O2 increased to 3L .  Sats 89-90%.    Spoke to Natalie RT she states she will come see patient shortly.

## 2024-06-16 NOTE — Progress Notes (Signed)
 PHARMACY - ANTICOAGULATION CONSULT NOTE  Pharmacy Consult for heparin  gtt Indication: chest pain/ACS  No Known Allergies  Patient Measurements: Height: 5' 1 (154.9 cm) Weight: 71.3 kg (157 lb 1.6 oz) IBW/kg (Calculated) : 47.8 HEPARIN  DW (KG): 63.2  Vital Signs: Temp: 98.9 F (37.2 C) (07/06 0409) Temp Source: Oral (07/06 0409) BP: 121/67 (07/06 0409) Pulse Rate: 86 (07/06 0409)  Labs: Recent Labs    06/14/24 0357 06/14/24 1438 06/15/24 0237 06/15/24 1805 06/16/24 0447  HGB  --   --  10.9*  --  11.5*  HCT  --   --  35.6*  --  39.3  PLT  --   --  223  --  218  APTT 129* 100* 69*  --   --   HEPARINUNFRC 0.76* 0.77* 0.37  --  0.24*  CREATININE 0.66  --   --  0.80 0.68    Estimated Creatinine Clearance: 54.9 mL/min (by C-G formula based on SCr of 0.68 mg/dL).   Assessment: 73 yoF on apixaban  PTA for AF admitted for NSTEMI. Pt on IV heparin , planning staged PCI next week.  Heparin  level fell overnight to 0.26 < goal on heparin  drip 650uts.hr. CBC stable no bleeding noted    Goal of Therapy:  Heparin  level 0.3-0.7 units/ml aPTT 66-102 seconds Monitor platelets by anticoagulation protocol: Yes   Plan:  Increase  heparin  infusion 700 units/h Heparin  level and cbc daily Follow up after procedure 7/7   Olam Chalk Pharm.D. CPP, BCPS Clinical Pharmacist [phone] 06/16/2024 7:33 AM   Please check AMION for all [facility] Pharmacy numbers 06/16/2024

## 2024-06-16 NOTE — Progress Notes (Signed)
  Progress Note  Patient Name: Stacey Sandoval Date of Encounter: 06/16/2024 [facility] Cardiologist: None   Interval Summary   No acute overnight events. Patient reports feeling relatively well this morning. She is awake and alert. Answering questions appropriately. Denies chest pain. Breathing improved after lasix  yesterday. Reports good UOP. No new or acute complaints.   Vital Signs Vitals:   06/15/24 2338 06/16/24 0409 06/16/24 0739 06/16/24 0800  BP: (!) 108/57 121/67 (!) 145/69   Pulse: 92 86  (!) 111  Resp: 16 15 20    Temp: 98.3 F (36.8 C) 98.9 F (37.2 C) 99.3 F (37.4 C)   TempSrc: Oral Oral    SpO2: 94% 96%  91%  Weight:      Height:        Intake/Output Summary (Last 24 hours) at 06/16/2024 0823 Last data filed at 06/16/2024 0818 Gross per 24 hour  Intake 1037 ml  Output 1900 ml  Net -863 ml      06/13/2024    8:53 PM 06/12/2024    6:14 AM 06/05/2023    2:15 PM  Last 3 Weights  Weight (lbs) 157 lb 1.6 oz 161 lb 4.8 oz 155 lb 2 oz  Weight (kg) 71.26 kg 73.165 kg 70.364 kg      Telemetry/ECG  SR with bursts of AT/AF - Personally Reviewed  Physical Exam  General: Well developed, in no acute distress.  Neck: No JVD.  Cardiac: Normal rate, regular rhythm.  Resp: Normal work of breathing. Bilateral rales. Ext: No edema.  Neuro: No gross focal deficits.  Psych: Normal affect.   Assessment & Plan  NSTEMI CAD: s/p cardiac cath; prox RCA heavily calcified with 95% stenosis, with additional disease as noted Mixed hyperlipidemia -Atherectomy and PCI on 7/7 -Antiplatelet regimen per Dr. Anner: Clopidogrel  rather than ticagrelor  given need for DOAC. -Continue IV heparin  -Continue atorvastatin . LDL at goal of 49. TG well controlled at 60 -Lp(a) still pending.   Paroxysmal atrial fibrillation: CHA2DS2/VAS Stroke Risk Points = 6  -On apixaban  as outpatient, heparin  here in anticipation of LHC. -Continue bisoprolol  and diltiazem .   Chronic diastolic  heart failure: EF 60-65%. Euvolemic and well compensated on exam.  -Continue bisoprolol  -IV lasix  40mg  again today.   Tobacco use COPD on PRN home O2 -Needs cessation of smoking -Scheduled duonebs q4h.  -Continue budesonide -glycopyrrolate -formoterol  inhalers. -Target O2 sat 88-92%.    For questions or updates, please contact [facility] Please consult [email or web address] for contact info under       Signed, Fonda Kitty, MD

## 2024-06-16 NOTE — Progress Notes (Signed)
 RN notified that patient's HR 110s-150's by telemetry monitoring tech.  AM meds given including 5mg  zebeta  and 40 iv lasix .  I notified both Artist Pouch, PA and Dr. Kennyth.  Both agreed no other meds at this time.

## 2024-06-17 ENCOUNTER — Encounter: Payer: Self-pay | Admitting: Cardiology

## 2024-06-17 ENCOUNTER — Telehealth (HOSPITAL_COMMUNITY): Payer: Self-pay | Admitting: Pharmacy Technician

## 2024-06-17 ENCOUNTER — Inpatient Hospital Stay (HOSPITAL_COMMUNITY)

## 2024-06-17 ENCOUNTER — Other Ambulatory Visit (HOSPITAL_COMMUNITY): Payer: Self-pay

## 2024-06-17 ENCOUNTER — Ambulatory Visit (HOSPITAL_COMMUNITY): Admission: RE | Admit: 2024-06-17 | Source: Home / Self Care | Admitting: Internal Medicine

## 2024-06-17 DIAGNOSIS — I5033 Acute on chronic diastolic (congestive) heart failure: Secondary | ICD-10-CM | POA: Diagnosis not present

## 2024-06-17 DIAGNOSIS — I214 Non-ST elevation (NSTEMI) myocardial infarction: Secondary | ICD-10-CM | POA: Diagnosis not present

## 2024-06-17 LAB — BASIC METABOLIC PANEL WITH GFR
Anion gap: 5 (ref 5–15)
BUN: 8 mg/dL (ref 8–23)
CO2: 35 mmol/L — ABNORMAL HIGH (ref 22–32)
Calcium: 7.8 mg/dL — ABNORMAL LOW (ref 8.9–10.3)
Chloride: 99 mmol/L (ref 98–111)
Creatinine, Ser: 0.67 mg/dL (ref 0.44–1.00)
GFR, Estimated: >60 mL/min (ref >60)
Glucose, Bld: 107 mg/dL — ABNORMAL HIGH (ref 70–99)
Potassium: 3.7 mmol/L (ref 3.5–5.1)
Sodium: 139 mmol/L (ref 135–145)

## 2024-06-17 LAB — CBC
HCT: 37.2 % (ref 36.0–46.0)
Hemoglobin: 10.7 g/dL — ABNORMAL LOW (ref 12.0–15.0)
MCH: 28.3 pg (ref 26.0–34.0)
MCHC: 28.8 g/dL — ABNORMAL LOW (ref 30.0–36.0)
MCV: 98.4 fL (ref 80.0–100.0)
Platelets: 230 K/uL (ref 150–400)
RBC: 3.78 MIL/uL — ABNORMAL LOW (ref 3.87–5.11)
RDW: 14.7 % (ref 11.5–15.5)
WBC: 7.1 K/uL (ref 4.0–10.5)
nRBC: 0 % (ref 0.0–0.2)

## 2024-06-17 LAB — LIPOPROTEIN A (LPA): Lipoprotein (a): <8.4 nmol/L (ref <75.0)

## 2024-06-17 LAB — HEPARIN LEVEL (UNFRACTIONATED)
Heparin Unfractionated: 0.13 [IU]/mL — ABNORMAL LOW (ref 0.30–0.70)
Heparin Unfractionated: 0.2 [IU]/mL — ABNORMAL LOW (ref 0.30–0.70)

## 2024-06-17 LAB — BRAIN NATRIURETIC PEPTIDE: B Natriuretic Peptide: 481.9 pg/mL — ABNORMAL HIGH (ref 0.0–100.0)

## 2024-06-17 MED ORDER — IPRATROPIUM-ALBUTEROL 0.5-2.5 (3) MG/3ML IN SOLN
3.0000 mL | Freq: Three times a day (TID) | RESPIRATORY_TRACT | Status: DC
  Administered 2024-06-17 – 2024-06-21 (×11): 3 mL via RESPIRATORY_TRACT
  Filled 2024-06-17 (×10): qty 3

## 2024-06-17 MED ORDER — TICAGRELOR 90 MG PO TABS
180.0000 mg | ORAL_TABLET | Freq: Once | ORAL | Status: AC
  Administered 2024-06-17: 180 mg via ORAL
  Filled 2024-06-17: qty 2

## 2024-06-17 MED ORDER — ASPIRIN 81 MG PO CHEW
81.0000 mg | CHEWABLE_TABLET | ORAL | Status: AC
  Administered 2024-06-17: 81 mg via ORAL
  Filled 2024-06-17 (×2): qty 1

## 2024-06-17 MED ORDER — ALBUTEROL SULFATE (2.5 MG/3ML) 0.083% IN NEBU
2.5000 mg | INHALATION_SOLUTION | RESPIRATORY_TRACT | Status: DC | PRN
  Administered 2024-06-20: 2.5 mg via RESPIRATORY_TRACT
  Filled 2024-06-17: qty 3

## 2024-06-17 MED ORDER — FUROSEMIDE 10 MG/ML IJ SOLN
60.0000 mg | Freq: Once | INTRAMUSCULAR | Status: AC
  Administered 2024-06-17: 60 mg via INTRAVENOUS
  Filled 2024-06-17: qty 6

## 2024-06-17 MED ORDER — SODIUM CHLORIDE 0.9 % WEIGHT BASED INFUSION
1.0000 mL/kg/h | INTRAVENOUS | Status: DC
  Administered 2024-06-17: 1 mL/kg/h via INTRAVENOUS

## 2024-06-17 MED ORDER — SODIUM CHLORIDE 0.9 % WEIGHT BASED INFUSION
3.0000 mL/kg/h | INTRAVENOUS | Status: DC
  Administered 2024-06-17: 3 mL/kg/h via INTRAVENOUS

## 2024-06-17 NOTE — Telephone Encounter (Signed)
 Patient Product/process development scientist completed.    The patient is insured through [facility]. Patient has Medicare and is not eligible for a copay card, but may be able to apply for patient assistance or Medicare RX Payment Plan (Patient Must reach out to their plan, if eligible for payment plan), if available.    Ran test claim for Farxiga 10 mg and the current 30 day co-pay is $47.00.  Ran test claim for Jardiance 10 mg and the current 30 day co-pay is $47.00.  This test claim was processed through [facility]- copay amounts may vary at other pharmacies due to pharmacy/plan contracts, or as the patient moves through the different stages of their insurance plan.     Roland Earl, CPHT Pharmacy Technician III Certified Patient Advocate [facility] Pharmacy Patient Advocate Team Direct Number: [phone]  Fax: [phone]

## 2024-06-17 NOTE — Progress Notes (Signed)
 PHARMACY - ANTICOAGULATION CONSULT NOTE  Pharmacy Consult for heparin  gtt Indication: chest pain/ACS  No Known Allergies  Patient Measurements: Height: 5' 1 (154.9 cm) Weight: 71.3 kg (157 lb 1.6 oz) IBW/kg (Calculated) : 47.8 HEPARIN  DW (KG): 63.2  Vital Signs: Temp: 97.6 F (36.4 C) (07/07 1955) Temp Source: Oral (07/07 1955) BP: 105/72 (07/07 1955) Pulse Rate: 82 (07/07 1955)  Labs: Recent Labs    06/15/24 0237 06/15/24 1805 06/16/24 0447 06/17/24 0327 06/17/24 2030  HGB 10.9*  --  11.5* 10.7*  --   HCT 35.6*  --  39.3 37.2  --   PLT 223  --  218 230  --   APTT 69*  --   --   --   --   HEPARINUNFRC 0.37  --  0.24* 0.13* 0.20*  CREATININE  --  0.80 0.68 0.67  --     Estimated Creatinine Clearance: 54.9 mL/min (by C-G formula based on SCr of 0.67 mg/dL).   Assessment: 73 yoF on apixaban  PTA for AF admitted for NSTEMI. Planning staged PCI 7/7 but unable to lie flat. No anticoagulation prior to admission. Pharmacy consulted for heparin .     Heparin  level 0.20 on heparin  drip 900 units/hr.  No issues with infusion or bleeding per RN.   Goal of Therapy:  Heparin  level 0.3-0.7 units/ml aPTT 66-102 seconds Monitor platelets by anticoagulation protocol: Yes   Plan:  Increase heparin  to 1100 units/hr Monitor daily heparin  level, CBC, signs/symptoms of bleeding  Will follow plans post cath  Jinnie Door, PharmD, BCPS, BCCP Clinical Pharmacist  Please check AMION for all [facility] Pharmacy phone numbers After 10:00 PM, call Main Pharmacy [phone]

## 2024-06-17 NOTE — Progress Notes (Addendum)
 Progress Note  Patient Name: Stacey Sandoval Date of Encounter: 06/17/2024 [facility] Cardiologist: None   Interval Summary   Patient is ill appearing/short of breath today. Denies any symptoms besides her dyspnea.   Vital Signs Vitals:   06/16/24 2326 06/16/24 2346 06/17/24 0415 06/17/24 0740  BP: 105/68  123/61 110/60  Pulse: 85  93 (!) 114  Resp: 18  18 20   Temp: 98.8 F (37.1 C)  98 F (36.7 C) 98.2 F (36.8 C)  TempSrc: Oral  Oral Oral  SpO2: 94% 98% 91% 90%  Weight:      Height:        Intake/Output Summary (Last 24 hours) at 06/17/2024 0750 Last data filed at 06/17/2024 0748 Gross per 24 hour  Intake 1063.02 ml  Output 1150 ml  Net -86.98 ml      06/13/2024    8:53 PM 06/12/2024    6:14 AM 06/05/2023    2:15 PM  Last 3 Weights  Weight (lbs) 157 lb 1.6 oz 161 lb 4.8 oz 155 lb 2 oz  Weight (kg) 71.26 kg 73.165 kg 70.364 kg      Telemetry/ECG  Afib with increasing ventricular rates. This morning 110-140bpm - Personally Reviewed  Physical Exam  GEN: Appears uncomfortable, short of breath Neck: JVP appears elevated Cardiac: rapid and irregular   Respiratory: Diffuse rhonchi/wheezing. Bilateral lower lobes diminished GI: Soft, nontender, non-distended  MS: No edema  Assessment & Plan   NSTEMI Patient presented to the [facility] emergency department on 06/12/2024 with complaints of chest pain. In the emergency department EKG revealed sinus rhythm. High-sensitivity troponins trended 25 and 479. She was started on aspirin  and heparin  drip per ACS protocol. Chest CT showed no PE but three-vessel coronary calcifications were noted. Left heart catheterization revealed proximal RCA lesion 95% stenosis, heavily calcified focal lesion, proximal RCA to mid RCA lesion 85% stenosis with 95% stenosis sidebranch in acute margin. Moderately calcified involving a large RV marginal branch, mid RCA lesion 50% stenosis, proximal circumflex lesion 70% stenosis. Patient transferred to  [facility] from [facility] for arthrectomy based PCI of her RCA.  Tentatively planned for LHC today but on exam is dyspneic and tachycardic. Having difficulty laying flat.  Continue ASA and Plavix  Continue Atorvastatin   Paroxysmal Atrial Fibrillation Patient bridging with heparin  with anticipated LHC. On Eliquis  at home. Has had intermittent RVR this admission, increased this morning which I suspect is secondary to her dyspnea.  Continue Bisoprolol  and Diltiazem . Will need to follow rates closely with diuresis and breathing treatments. If not improved, would need to consider additional therapy for rate control. Unfortunately she is a poor candidate for amiodarone  due to significant underlying lung disease.  Chronic Diastolic CHF LVEF this admission 60-65%. On daily lasix  at home but held following admission. Patient initially well compensated but did develop signs of hypervolemia over the weekend and IV lasix  40mg  x2 given. This morning, patient visibly short of breath with diffuse chronic/wheezing with diminished breath sounds in bases. Recheck BNP (was 625.6 on 7/5). CXR ordered Increase Lasix  to 60mg  IV today. Given acute on chronic CHF this admission, would consider adding MRA and SGLT2 prior to discharge.   Tobacco use Hypoxic respiratory failure Patient with COPD uses PRN O2 at home. Is now requiring sustained O2 between 2-3L. Suspect her dyspnea is multi-factorial. Continue scheduled Breztri  Continue QID duoneb treatments       For questions or updates, please contact [facility] Please consult [email or web address] for contact info under  Signed, Artist Pouch, PA-C   Patient seen, examined. Available data reviewed. Agree with findings, assessment, and plan as outlined by Artist Pouch, PA-C. Pt more short of breath today. On my exam: Vitals:   06/17/24 0854 06/17/24 0857  BP:    Pulse:    Resp:    Temp:    SpO2: 97% 97%   Pt is alert and oriented, NAD HEENT: normal Neck:  JVP - normal Lungs: diffuse rhonchi and rales bilaterally CV: RRR without murmur or gallop Abd: soft, NT, Positive BS, no hepatomegaly Ext: no C/C/E, distal pulses intact and equal Skin: warm/dry no rash  CXR:  1. Persistent vascular congestion with similar bilateral diffuse interstitial prominence, suggestive of pulmonary interstitial edema. 2. Similar small bilateral pleural effusions.  Plan: not stable for atherectomy today. Stop fluids, diurese with IV lasix  x 2 doses today. Hopefully will respond well and be ready for procedure tomorrow. Continue heparin , ASA, clopidogrel . Chest pain-free today. Tele shows paroxysms of AF, but currently in sinus rhythm. Plan to resume apixaban  post-procedure. Otherwise, as outlined above.    Ozell Fell, M.D. 06/17/2024 10:30 AM

## 2024-06-17 NOTE — TOC Initial Note (Signed)
 Transition of Care [facility]) - Initial/Assessment Note    Patient Details  Name: Stacey Sandoval MRN: [id number] Date of Birth: 1951-06-15  Transition of Care [facility]) CM/SW Contact:    Sudie Erminio Deems, RN Phone Number: 06/17/2024, 10:21 AM  Clinical Narrative:  Patient presented for chest pain-plan for coronary atherectomy today. PTA patient was independent from home with daughter. Patient has DME cane and oxygen  prn with [employer]. Daughter takes patient to appointments and she has no issue getting medications. Case Manager will continue to follow for transition of care needs.              Expected Discharge Plan: Home/Self Care Barriers to Discharge: No Barriers Identified   Patient Goals and CMS Choice Patient states their goals for this hospitalization and ongoing recovery are:: plan to return home with support of daughter once stable.          Expected Discharge Plan and Services In-house Referral: NA Discharge Planning Services: CM Consult Post Acute Care Choice: NA Living arrangements for the past 2 months: Single Family Home                   DME Agency: NA       HH Arranged: NA          Prior Living Arrangements/Services Living arrangements for the past 2 months: Single Family Home Lives with:: Adult Children Patient language and need for interpreter reviewed:: Yes        Need for Family Participation in Patient Care: No (Comment) Care giver support system in place?: No (comment) Current home services: DME (cane and oxygen  with [employer]-prn use) Criminal Activity/Legal Involvement Pertinent to Current Situation/Hospitalization: No - Comment as needed  Activities of Daily Living   ADL Screening (condition at time of admission) Independently performs ADLs?: Yes (appropriate for developmental age) Is the patient deaf or have difficulty hearing?: No Does the patient have difficulty seeing, even when wearing glasses/contacts?: No Does the patient have  difficulty concentrating, remembering, or making decisions?: No  Permission Sought/Granted Permission sought to share information with : Case Manager, Family Supports                Emotional Assessment Appearance:: Appears stated age Attitude/Demeanor/Rapport: Engaged Affect (typically observed): Appropriate Orientation: : Oriented to Self, Oriented to Place Alcohol  / Substance Use: Not Applicable Psych Involvement: No (comment)  Admission diagnosis:  NSTEMI (non-ST elevated myocardial infarction) [facility]) [I21.4] Patient Active Problem List   Diagnosis Date Noted   Oxygen  dependent 06/13/2024   (HFpEF) heart failure with preserved ejection fraction (HCC) 06/13/2024   Chest pain 06/12/2024   NSTEMI (non-ST elevated myocardial infarction) (HCC) 06/12/2024   Coronary artery disease involving native heart 06/12/2024   Paroxysmal atrial fibrillation (HCC) 06/12/2024   Acute on chronic diastolic CHF (congestive heart failure) (HCC) 03/16/2022   Hypertrophic cardiomegaly 03/16/2022   Hypokalemia 03/16/2022   Hypomagnesemia 03/14/2022   Atrial fibrillation with rapid ventricular response (HCC) 03/11/2022   Rhinovirus infection 03/10/2022   Current smoker 03/10/2022   Obesity (BMI 30-39.9) 03/10/2022   Acute hypoxemic respiratory failure (HCC) 03/09/2022   Insomnia 03/08/2022   Essential hypertension 03/08/2022   Hyperlipidemia 03/08/2022   Hyponatremia 03/08/2022   Acute pulmonary edema (HCC) 03/08/2022   COPD exacerbation (HCC) 03/08/2022   Acquired hypothyroidism 02/23/2012   PCP:  [facility]:   [id number] - [place], [place] - [address] AT [facility] [address] [place] [address] Phone: [phone] Fax: [phone]     Social Drivers of Health (SDOH) Social History: SDOH Screenings   Food Insecurity: No Food Insecurity (06/14/2024)  Housing: Low Risk  (06/14/2024)  Transportation Needs: No  Transportation Needs (06/14/2024)  Utilities: Not At Risk (06/14/2024)  Financial Resource Strain: Low Risk  (09/01/2023)   Received from [facility]  Social Connections: Moderately Isolated (06/14/2024)  Tobacco Use: High Risk (06/14/2024)   SDOH Interventions:     Readmission Risk Interventions     No data to display

## 2024-06-17 NOTE — Care Management Important Message (Signed)
 Important Message  Patient Details  Name: Stacey Sandoval MRN: [id number] Date of Birth: [birth year]   Important Message Given:  Yes - Medicare IM     Jon Cruel 06/17/2024, 12:30 PM

## 2024-06-17 NOTE — Progress Notes (Signed)
 PHARMACY - ANTICOAGULATION CONSULT NOTE  Pharmacy Consult for heparin  gtt Indication: chest pain/ACS  No Known Allergies  Patient Measurements: Height: 5' 1 (154.9 cm) Weight: 71.3 kg (157 lb 1.6 oz) IBW/kg (Calculated) : 47.8 HEPARIN  DW (KG): 63.2  Vital Signs: Temp: 98 F (36.7 C) (07/07 0415) Temp Source: Oral (07/07 0415) BP: 123/61 (07/07 0415) Pulse Rate: 93 (07/07 0415)  Labs: Recent Labs    06/14/24 1438 06/14/24 1438 06/15/24 0237 06/15/24 1805 06/16/24 0447 06/17/24 0327  HGB  --    < > 10.9*  --  11.5* 10.7*  HCT  --   --  35.6*  --  39.3 37.2  PLT  --   --  223  --  218 230  APTT 100*  --  69*  --   --   --   HEPARINUNFRC 0.77*  --  0.37  --  0.24* 0.13*  CREATININE  --   --   --  0.80 0.68 0.67   < > = values in this interval not displayed.    Estimated Creatinine Clearance: 54.9 mL/min (by C-G formula based on SCr of 0.67 mg/dL).   Assessment: 73 yoF on apixaban  PTA for AF admitted for NSTEMI. Pt on IV heparin , planning staged PCI today  Heparin  level 0.13 on heparin  drip 700uts/hr.   Goal of Therapy:  Heparin  level 0.3-0.7 units/ml aPTT 66-102 seconds Monitor platelets by anticoagulation protocol: Yes   Plan:  -increase heparin  to 900 units/hr -Will follow plans post cath  Prentice Poisson, PharmD Clinical Pharmacist **Pharmacist phone directory can now be found on [email or web address] ([email or web address]).  Listed under [facility].

## 2024-06-17 NOTE — Plan of Care (Signed)

## 2024-06-18 ENCOUNTER — Encounter (HOSPITAL_COMMUNITY)
Admission: AD | Disposition: A | Discharge status: Home Health | Payer: Self-pay | Source: Other Acute Inpatient Hospital | Attending: Cardiology

## 2024-06-18 DIAGNOSIS — I480 Paroxysmal atrial fibrillation: Secondary | ICD-10-CM | POA: Diagnosis not present

## 2024-06-18 DIAGNOSIS — I2510 Atherosclerotic heart disease of native coronary artery without angina pectoris: Secondary | ICD-10-CM

## 2024-06-18 DIAGNOSIS — I5033 Acute on chronic diastolic (congestive) heart failure: Secondary | ICD-10-CM | POA: Diagnosis not present

## 2024-06-18 DIAGNOSIS — I214 Non-ST elevation (NSTEMI) myocardial infarction: Secondary | ICD-10-CM | POA: Diagnosis not present

## 2024-06-18 HISTORY — PX: CORONARY STENT INTERVENTION: CATH118234

## 2024-06-18 HISTORY — PX: CORONARY ATHERECTOMY: CATH118238

## 2024-06-18 HISTORY — PX: CORONARY IMAGING/OCT: CATH118326

## 2024-06-18 LAB — BASIC METABOLIC PANEL WITH GFR
Anion gap: 8 (ref 5–15)
Anion gap: 12 (ref 5–15)
BUN: 15 mg/dL (ref 8–23)
BUN: 14 mg/dL (ref 8–23)
CO2: 36 mmol/L — ABNORMAL HIGH (ref 22–32)
CO2: 34 mmol/L — ABNORMAL HIGH (ref 22–32)
Calcium: 7.5 mg/dL — ABNORMAL LOW (ref 8.9–10.3)
Calcium: 7.6 mg/dL — ABNORMAL LOW (ref 8.9–10.3)
Chloride: 95 mmol/L — ABNORMAL LOW (ref 98–111)
Chloride: 96 mmol/L — ABNORMAL LOW (ref 98–111)
Creatinine, Ser: 0.58 mg/dL (ref 0.44–1.00)
Creatinine, Ser: 0.65 mg/dL (ref 0.44–1.00)
GFR, Estimated: >60 mL/min (ref >60)
GFR, Estimated: >60 mL/min (ref >60)
Glucose, Bld: 100 mg/dL — ABNORMAL HIGH (ref 70–99)
Glucose, Bld: 96 mg/dL (ref 70–99)
Potassium: 3.4 mmol/L — ABNORMAL LOW (ref 3.5–5.1)
Potassium: 3.6 mmol/L (ref 3.5–5.1)
Sodium: 139 mmol/L (ref 135–145)
Sodium: 142 mmol/L (ref 135–145)

## 2024-06-18 LAB — CBC
HCT: 32.5 % — ABNORMAL LOW (ref 36.0–46.0)
Hemoglobin: 9.7 g/dL — ABNORMAL LOW (ref 12.0–15.0)
MCH: 28.5 pg (ref 26.0–34.0)
MCHC: 29.8 g/dL — ABNORMAL LOW (ref 30.0–36.0)
MCV: 95.6 fL (ref 80.0–100.0)
Platelets: 230 K/uL (ref 150–400)
RBC: 3.4 MIL/uL — ABNORMAL LOW (ref 3.87–5.11)
RDW: 14.8 % (ref 11.5–15.5)
WBC: 6.4 K/uL (ref 4.0–10.5)
nRBC: 0 % (ref 0.0–0.2)

## 2024-06-18 LAB — HEPARIN LEVEL (UNFRACTIONATED): Heparin Unfractionated: 0.33 [IU]/mL (ref 0.30–0.70)

## 2024-06-18 SURGERY — CORONARY ATHERECTOMY
Anesthesia: LOCAL

## 2024-06-18 MED ORDER — POTASSIUM CHLORIDE CRYS ER 20 MEQ PO TBCR: 60.0000 meq | EXTENDED_RELEASE_TABLET | Freq: Once | ORAL | Status: DC

## 2024-06-18 MED ORDER — PANTOPRAZOLE SODIUM 40 MG IV SOLR
40.0000 mg | Freq: Two times a day (BID) | INTRAVENOUS | Status: DC
  Administered 2024-06-19 (×2): 40 mg via INTRAVENOUS
  Filled 2024-06-18: qty 10

## 2024-06-18 MED ORDER — FUROSEMIDE 10 MG/ML IJ SOLN
60.0000 mg | Freq: Once | INTRAMUSCULAR | Status: DC
  Filled 2024-06-18: qty 6

## 2024-06-18 MED ORDER — FENTANYL CITRATE (PF) 100 MCG/2ML IJ SOLN
INTRAMUSCULAR | Status: DC | PRN
  Administered 2024-06-18: 25 ug via INTRAVENOUS

## 2024-06-18 MED ORDER — LIDOCAINE HCL (PF) 1 % IJ SOLN
INTRAMUSCULAR | Status: DC | PRN
  Administered 2024-06-18: 15 mL
  Administered 2024-06-18: 2 mL

## 2024-06-18 MED ORDER — SODIUM CHLORIDE 0.9 % IV SOLN: INTRAVENOUS | Status: AC

## 2024-06-18 MED ORDER — SODIUM CHLORIDE 0.9 % IV SOLN: INTRAVENOUS | Status: DC

## 2024-06-18 MED ORDER — VERAPAMIL HCL 2.5 MG/ML IV SOLN
INTRAVENOUS | Status: AC
Start: 2024-06-18 — End: 2024-06-18
  Filled 2024-06-18: qty 2

## 2024-06-18 MED ORDER — MIDAZOLAM HCL 2 MG/2ML IJ SOLN
INTRAMUSCULAR | Status: DC | PRN
  Administered 2024-06-18: 1 mg via INTRAVENOUS

## 2024-06-18 MED ORDER — ASPIRIN 81 MG PO CHEW
81.0000 mg | CHEWABLE_TABLET | ORAL | Status: AC
  Administered 2024-06-18: 81 mg via ORAL
  Filled 2024-06-18: qty 1

## 2024-06-18 MED ORDER — HEPARIN (PORCINE) IN NACL 1000-0.9 UT/500ML-% IV SOLN
INTRAVENOUS | Status: DC | PRN
  Administered 2024-06-18 (×2): 500 mL

## 2024-06-18 MED ORDER — SODIUM CHLORIDE 0.9 % IV SOLN
5.0000 mg/kg | INTRAVENOUS | Status: AC
  Administered 2024-06-18: 356.5 mg via INTRAVENOUS
  Filled 2024-06-18 (×2): qty 14.26

## 2024-06-18 MED ORDER — HEPARIN SODIUM (PORCINE) 1000 UNIT/ML IJ SOLN
INTRAMUSCULAR | Status: DC | PRN
  Administered 2024-06-18: 3000 [IU] via INTRAVENOUS
  Administered 2024-06-18: 7000 [IU] via INTRAVENOUS

## 2024-06-18 MED ORDER — SODIUM CHLORIDE 0.9 % IV SOLN
INTRAVENOUS | Status: AC | PRN
  Administered 2024-06-18: 10 mL/h via INTRAVENOUS

## 2024-06-18 MED ORDER — VERAPAMIL HCL 2.5 MG/ML IV SOLN
INTRAVENOUS | Status: DC | PRN
  Administered 2024-06-18: 10 mL via INTRA_ARTERIAL

## 2024-06-18 MED ORDER — NITROGLYCERIN 1 MG/10 ML FOR IR/CATH LAB
INTRA_ARTERIAL | Status: DC | PRN
  Administered 2024-06-18: 200 ug via INTRACORONARY

## 2024-06-18 MED ORDER — SODIUM CHLORIDE 0.9 % IV SOLN: 250.0000 mL | INTRAVENOUS | Status: AC | PRN

## 2024-06-18 MED ORDER — MIDAZOLAM HCL 2 MG/2ML IJ SOLN
INTRAMUSCULAR | Status: AC
Start: 2024-06-18 — End: 2024-06-18
  Filled 2024-06-18: qty 2

## 2024-06-18 MED ORDER — POTASSIUM CHLORIDE CRYS ER 20 MEQ PO TBCR
40.0000 meq | EXTENDED_RELEASE_TABLET | ORAL | Status: DC
  Filled 2024-06-18: qty 2

## 2024-06-18 MED ORDER — LIDOCAINE HCL (PF) 1 % IJ SOLN
INTRAMUSCULAR | Status: AC
  Filled 2024-06-18: qty 30

## 2024-06-18 MED ORDER — HEPARIN (PORCINE) 25000 UT/250ML-% IV SOLN: 1100.0000 [IU]/h | INTRAVENOUS | Status: DC

## 2024-06-18 MED ORDER — NOREPINEPHRINE BITARTRATE 1 MG/ML IV SOLN
INTRAVENOUS | Status: DC | PRN
  Administered 2024-06-18: 5 ug/min via INTRAVENOUS

## 2024-06-18 MED ORDER — HYDRALAZINE HCL 20 MG/ML IJ SOLN: 10.0000 mg | INTRAMUSCULAR | Status: AC | PRN

## 2024-06-18 MED ORDER — VIPERSLIDE LUBRICANT OPTIME: TOPICAL | Status: DC | PRN

## 2024-06-18 MED ORDER — LABETALOL HCL 5 MG/ML IV SOLN: 10.0000 mg | INTRAVENOUS | Status: AC | PRN

## 2024-06-18 MED ORDER — FENTANYL CITRATE (PF) 100 MCG/2ML IJ SOLN
INTRAMUSCULAR | Status: AC
Start: 2024-06-18 — End: 2024-06-18
  Filled 2024-06-18: qty 2

## 2024-06-18 MED ORDER — BISOPROLOL FUMARATE 5 MG PO TABS: 5.0000 mg | ORAL_TABLET | Freq: Every day | ORAL | Status: DC

## 2024-06-18 MED ORDER — NITROGLYCERIN 1 MG/10 ML FOR IR/CATH LAB
INTRA_ARTERIAL | Status: AC
  Filled 2024-06-18: qty 10

## 2024-06-18 MED ORDER — HEPARIN SODIUM (PORCINE) 1000 UNIT/ML IJ SOLN
INTRAMUSCULAR | Status: AC
Start: 2024-06-18 — End: 2024-06-18
  Filled 2024-06-18: qty 10

## 2024-06-18 MED ORDER — SODIUM CHLORIDE 0.9% FLUSH
3.0000 mL | Freq: Two times a day (BID) | INTRAVENOUS | Status: DC
  Administered 2024-06-19 – 2024-06-21 (×5): 3 mL via INTRAVENOUS

## 2024-06-18 MED ORDER — SODIUM CHLORIDE 0.9% FLUSH: 3.0000 mL | INTRAVENOUS | Status: DC | PRN

## 2024-06-18 MED ORDER — IOHEXOL 350 MG/ML SOLN
INTRAVENOUS | Status: DC | PRN
  Administered 2024-06-18: 98 mL

## 2024-06-18 SURGICAL SUPPLY — 26 items
BALLOON SAPPHIRE 2.5X20 (BALLOONS) IMPLANT
BALLOON SAPPHIRE 3.0X10 (BALLOONS) IMPLANT
BALLOON SAPPHIRE NC24 3.25X22 (BALLOONS) IMPLANT
BALLOON SAPPHIRE NC24 4.0X10 (BALLOONS) IMPLANT
BALLOON SAPPHIRE NC24 4.5X8 (BALLOONS) IMPLANT
CATH DRAGONFLY OPSTAR (CATHETERS) IMPLANT
CATH INFINITI JR4 5F (CATHETERS) IMPLANT
CATH LAUNCHER 6FR AL.75 (CATHETERS) IMPLANT
CATH TELEPORT (CATHETERS) IMPLANT
CLOSURE MYNX CONTROL 6F/7F (Vascular Products) IMPLANT
CROWN DIAMONDBACK CLASSIC 1.25 (BURR) IMPLANT
DEVICE RAD COMP TR BAND LRG (VASCULAR PRODUCTS) IMPLANT
ELECT DEFIB PAD ADLT CADENCE (PAD) IMPLANT
GLIDESHEATH SLEND SS 6F .021 (SHEATH) IMPLANT
GUIDEWIRE INQWIRE 1.5J.035X260 (WIRE) IMPLANT
KIT ENCORE 26 ADVANTAGE (KITS) IMPLANT
KIT HEMO VALVE WATCHDOG (MISCELLANEOUS) IMPLANT
LUBRICANT VIPERSLIDE CORONARY (MISCELLANEOUS) IMPLANT
PACK CARDIAC CATHETERIZATION (CUSTOM PROCEDURE TRAY) ×1 IMPLANT
SET ATX-X65L (MISCELLANEOUS) IMPLANT
SHEATH PINNACLE 6F 10CM (SHEATH) IMPLANT
SHEATH PROBE COVER 6X72 (BAG) IMPLANT
STENT SYNERGY XD 3.0X32 (Permanent Stent) IMPLANT
STENT SYNERGY XD 4.0X28 (Permanent Stent) IMPLANT
WIRE RUNTHROUGH IZANAI 014 300 (WIRE) IMPLANT
WIRE VIPERWIRE COR FLEX .012 (WIRE) IMPLANT

## 2024-06-18 NOTE — Interval H&P Note (Signed)
 History and Physical Interval Note:  06/18/2024 2:00 PM  Stacey Sandoval  has presented today for surgery, with the diagnosis of CAD / NSTEMI/CHF/PAF.     The various methods of treatment have been discussed with the patient and family. After consideration of risks, benefits and other options for treatment, the patient has consented to  Procedure(s): CORONARY ATHERECTOMY (N/A) CORORNARY STENT PLACEMENT     as a surgical intervention.  The patient's history has been reviewed, patient examined, no change in status, stable for surgery.  I have reviewed the patient's chart and labs.  Questions were answered to the patient's satisfaction.     Alm Clay

## 2024-06-18 NOTE — Progress Notes (Addendum)
 Progress Note  Patient Name: Stacey Sandoval Date of Encounter: 06/18/2024 [facility] Cardiologist: None   Interval Summary    No chest pain, breathing is better today.   Vital Signs Vitals:   06/17/24 2344 06/18/24 0442 06/18/24 0802 06/18/24 0817  BP: 121/77 (!) 115/58  114/60  Pulse:  99    Resp: 20  18 18   Temp: (!) 97.5 F (36.4 C) 98 F (36.7 C)  97.8 F (36.6 C)  TempSrc: Oral Oral  Oral  SpO2:  92%    Weight:      Height:        Intake/Output Summary (Last 24 hours) at 06/18/2024 0906 Last data filed at 06/18/2024 9365 Gross per 24 hour  Intake 354.56 ml  Output --  Net 354.56 ml      06/13/2024    8:53 PM 06/12/2024    6:14 AM 06/05/2023    2:15 PM  Last 3 Weights  Weight (lbs) 157 lb 1.6 oz 161 lb 4.8 oz 155 lb 2 oz  Weight (kg) 71.26 kg 73.165 kg 70.364 kg      Telemetry/ECG   Sinus Rhythm with paroxysms of atrial fibrillations with rates up to 130s at times  - Personally Reviewed  Physical Exam  GEN: No acute distress, remains on 2L  Neck: No JVD Cardiac: Tachycardia, no murmurs, rubs, or gallops.  Respiratory: Crackles in bases, course rhonchi GI: Soft, nontender, non-distended  MS: No edema  Assessment & Plan   73 y.o. female with a past medical history of paroxysmal atrial fibrillation, COPD, chronic HFpEF, current smoker x 40+ years, who was seen 06/13/2024 for the evaluation of a recent NSTEMI. Underwent cardiac cath at [facility] with focal lesion in the RCA needs CSI. Transferred to [facility] for intervention  NSTEMI -- Initially presented to [facility] with chest pain and found to have NSTEMI.  Underwent cardiac catheterization 7/3 with proximal RCA lesion of 95%, heavily calcified, mid RCA lesion 85% with 95% stenosis of sidebranch and acute margin.  Moderately calcified large RV marginal branch, mid RCA lesion 50% with proximal circumflex lesion of 70%.  Transfer to Stacey Sandoval for further plans for intervention with CSI/arthrectomy of the RCA.   Initially planned for yesterday but was significantly dyspneic and tachycardic on exam having difficulty lying flat.  Respiratory status has improved today.  Will plan for cardiac cath. -- Continue aspirin , Plavix , atorvastatin   Paroxysmal atrial fibrillation -- Continues to have intermittent episodes of RVR, underlying sinus rhythm -- Continue bisoprolol  and diltiazem , BP somewhat soft making further titration difficult.  Would consider her a poor candidate for amiodarone  given significant underlying lung disease. -- continue IV heparin   Acute on Chronic diastolic HF -- Echo 7/2 with LVEF of 60 to 65%, no regional wall motion abnormality, normal RV, moderately enlarged left atria, mild MR -- BNP 625>>481, chest x-ray 7/7 with small bilateral effusions -- Given 2 doses of IV Lasix  yesterday, minimal urine output noted and no updated weight.  She does report 5-6 episodes of urine output yesterday. -- Still with coarse rhonchi on exam, crackles in bases.  Will give IV Lasix  60 mg x 1 now -- Would plan to add SGLT2 prior to discharge, +/- spiro   Tobacco use COPD -- continues to require 2L, but breathing improved from yesterday -- continue Breztri , duoneb  Hypokalemia -- K+ 3.4, supplement   Medical Readiness Date: 06/19/2024    For questions or updates, please contact [facility] Please consult [email or web address] for  contact info under       Signed, Manuelita Rummer, NP   Patient seen, examined. Available data reviewed. Agree with findings, assessment, and plan as outlined by Manuelita Rummer, NP.  Patient is independently interviewed and examined.  She is alert, oriented, in no distress, elderly woman.  Patient on 2 L of O2 per nasal cannula.  HEENT is normal, JVP is normal, lungs with scattered rhonchi but improved air movement, heart is regular rate and rhythm.  With episodes of tachycardia intermittently.  There is a 2/6 systolic murmur at the left lower sternal border and apex,  abdomen soft and nontender, extremities have no edema.  The patient has diuresed well over the last 24 hours.  Telemetry shows that she is still having frequent runs of either atrial tachycardia or brief runs of atrial fibrillation.  I think she is on is much beta-blocker and calcium  channel blocker if she can probably tolerate for her blood pressure.  Amiodarone  would be a consideration but I am reluctant to use this in the context of her underlying lung disease.  PFTs reviewed and she has moderate COPD.  Will continue current management for now. Continue aspirin  and clopidogrel  with plans for RCA atherectomy today.  Risks, indications, and alternatives have been reviewed with the patient and her daughter who is present at bedside.  They understand and agree to proceed.  Stacey Sandoval, M.D. 06/18/2024 9:55 AM

## 2024-06-18 NOTE — Progress Notes (Signed)
 PHARMACY - ANTICOAGULATION CONSULT NOTE  Pharmacy Consult for heparin  gtt Indication: chest pain/ACS  No Known Allergies  Patient Measurements: Height: 5' 1 (154.9 cm) Weight: 71.3 kg (157 lb 1.6 oz) IBW/kg (Calculated) : 47.8 HEPARIN  DW (KG): 63.2  Vital Signs: Temp: 97.8 F (36.6 C) (07/08 0817) Temp Source: Oral (07/08 0817) BP: 114/60 (07/08 0817) Pulse Rate: 99 (07/08 0442)  Labs: Recent Labs    06/16/24 0447 06/17/24 0327 06/17/24 2030 06/18/24 0530  HGB 11.5* 10.7*  --  9.7*  HCT 39.3 37.2  --  32.5*  PLT 218 230  --  230  HEPARINUNFRC 0.24* 0.13* 0.20* 0.33  CREATININE 0.68 0.67  --  0.58    Estimated Creatinine Clearance: 54.9 mL/min (by C-G formula based on SCr of 0.58 mg/dL).   Assessment: 73 yoF on apixaban  PTA for AF admitted for NSTEMI. Planning staged PCI 7/7 but unable to lie flat. No anticoagulation prior to admission. Pharmacy consulted for heparin .    -Heparin  level 0.33 on heparin  drip 1100 units/hr.   -Hg 9.7  Goal of Therapy:  Heparin  level 0.3-0.7 units/ml aPTT 66-102 seconds Monitor platelets by anticoagulation protocol: Yes   Plan:  Continue heparin  1100 units/hr Will follow plans post cath  Prentice Poisson, PharmD Clinical Pharmacist **Pharmacist phone directory can now be found on [email or web address] ([email or web address]).  Listed under [facility].

## 2024-06-18 NOTE — Plan of Care (Signed)

## 2024-06-18 NOTE — Progress Notes (Signed)
 PHARMACY - ANTICOAGULATION CONSULT NOTE  Pharmacy Consult for heparin  gtt Indication: chest pain/ACS  No Known Allergies  Patient Measurements: Height: 5' 1 (154.9 cm) Weight: 69.5 kg (153 lb 4.8 oz) IBW/kg (Calculated) : 47.8 HEPARIN  DW (KG): 63.2  Vital Signs: Temp: 98 F (36.7 C) (07/08 1941) Temp Source: Oral (07/08 1941) BP: 105/63 (07/08 2140) Pulse Rate: 100 (07/08 2119)  Labs: Recent Labs    06/16/24 0447 06/17/24 0327 06/17/24 2030 06/18/24 0530 06/18/24 0938  HGB 11.5* 10.7*  --  9.7*  --   HCT 39.3 37.2  --  32.5*  --   PLT 218 230  --  230  --   HEPARINUNFRC 0.24* 0.13* 0.20* 0.33  --   CREATININE 0.68 0.67  --  0.58 0.65    Estimated Creatinine Clearance: 54.2 mL/min (by C-G formula based on SCr of 0.65 mg/dL).   Assessment: 73 yoF on apixaban  PTA for AF admitted for NSTEMI. Planning staged PCI 7/7 but unable to lie flat. No anticoagulation prior to admission. Pharmacy consulted for heparin .    -Heparin  level 0.33 on heparin  drip 1100 units/hr.   -Hg 9.7  PM: s/p cath, to resume heparin  2hr s/p TR band removal (done ~21:45).   Goal of Therapy:  Heparin  level 0.3-0.7 units/ml aPTT 66-102 seconds Monitor platelets by anticoagulation protocol: Yes   Plan:  At midnight, resume heparin  1100 units/hr (no bolus) Check heparin  level in 8hr F/u resume apixaban  7/9  Rocky Slade, PharmD, BCPS Clinical Pharmacist **Pharmacist phone directory can now be found on [email or web address] ([email or web address]).  Listed under [facility].

## 2024-06-18 NOTE — H&P (View-Only) (Signed)
 Progress Note  Patient Name: Stacey Sandoval Date of Encounter: 06/18/2024 [facility] Cardiologist: None   Interval Summary    No chest pain, breathing is better today.   Vital Signs Vitals:   06/17/24 2344 06/18/24 0442 06/18/24 0802 06/18/24 0817  BP: 121/77 (!) 115/58  114/60  Pulse:  99    Resp: 20  18 18   Temp: (!) 97.5 F (36.4 C) 98 F (36.7 C)  97.8 F (36.6 C)  TempSrc: Oral Oral  Oral  SpO2:  92%    Weight:      Height:        Intake/Output Summary (Last 24 hours) at 06/18/2024 0906 Last data filed at 06/18/2024 9365 Gross per 24 hour  Intake 354.56 ml  Output --  Net 354.56 ml      06/13/2024    8:53 PM 06/12/2024    6:14 AM 06/05/2023    2:15 PM  Last 3 Weights  Weight (lbs) 157 lb 1.6 oz 161 lb 4.8 oz 155 lb 2 oz  Weight (kg) 71.26 kg 73.165 kg 70.364 kg      Telemetry/ECG   Sinus Rhythm with paroxysms of atrial fibrillations with rates up to 130s at times  - Personally Reviewed  Physical Exam  GEN: No acute distress, remains on 2L  Neck: No JVD Cardiac: Tachycardia, no murmurs, rubs, or gallops.  Respiratory: Crackles in bases, course rhonchi GI: Soft, nontender, non-distended  MS: No edema  Assessment & Plan   73 y.o. female with a past medical history of paroxysmal atrial fibrillation, COPD, chronic HFpEF, current smoker x 40+ years, who was seen 06/13/2024 for the evaluation of a recent NSTEMI. Underwent cardiac cath at [facility] with focal lesion in the RCA needs CSI. Transferred to [facility] for intervention  NSTEMI -- Initially presented to [facility] with chest pain and found to have NSTEMI.  Underwent cardiac catheterization 7/3 with proximal RCA lesion of 95%, heavily calcified, mid RCA lesion 85% with 95% stenosis of sidebranch and acute margin.  Moderately calcified large RV marginal branch, mid RCA lesion 50% with proximal circumflex lesion of 70%.  Transfer to [facility] for further plans for intervention with CSI/arthrectomy of the RCA.   Initially planned for yesterday but was significantly dyspneic and tachycardic on exam having difficulty lying flat.  Respiratory status has improved today.  Will plan for cardiac cath. -- Continue aspirin , Plavix , atorvastatin   Paroxysmal atrial fibrillation -- Continues to have intermittent episodes of RVR, underlying sinus rhythm -- Continue bisoprolol  and diltiazem , BP somewhat soft making further titration difficult.  Would consider her a poor candidate for amiodarone  given significant underlying lung disease. -- continue IV heparin   Acute on Chronic diastolic HF -- Echo 7/2 with LVEF of 60 to 65%, no regional wall motion abnormality, normal RV, moderately enlarged left atria, mild MR -- BNP 625>>481, chest x-ray 7/7 with small bilateral effusions -- Given 2 doses of IV Lasix  yesterday, minimal urine output noted and no updated weight.  She does report 5-6 episodes of urine output yesterday. -- Still with coarse rhonchi on exam, crackles in bases.  Will give IV Lasix  60 mg x 1 now -- Would plan to add SGLT2 prior to discharge, +/- spiro   Tobacco use COPD -- continues to require 2L, but breathing improved from yesterday -- continue Breztri , duoneb  Hypokalemia -- K+ 3.4, supplement   Medical Readiness Date: 06/19/2024    For questions or updates, please contact [facility] Please consult [email or web address] for  contact info under       Signed, Manuelita Rummer, NP   Patient seen, examined. Available data reviewed. Agree with findings, assessment, and plan as outlined by Manuelita Rummer, NP.  Patient is independently interviewed and examined.  She is alert, oriented, in no distress, elderly woman.  Patient on 2 L of O2 per nasal cannula.  HEENT is normal, JVP is normal, lungs with scattered rhonchi but improved air movement, heart is regular rate and rhythm.  With episodes of tachycardia intermittently.  There is a 2/6 systolic murmur at the left lower sternal border and apex,  abdomen soft and nontender, extremities have no edema.  The patient has diuresed well over the last 24 hours.  Telemetry shows that she is still having frequent runs of either atrial tachycardia or brief runs of atrial fibrillation.  I think she is on is much beta-blocker and calcium  channel blocker if she can probably tolerate for her blood pressure.  Amiodarone  would be a consideration but I am reluctant to use this in the context of her underlying lung disease.  PFTs reviewed and she has moderate COPD.  Will continue current management for now. Continue aspirin  and clopidogrel  with plans for RCA atherectomy today.  Risks, indications, and alternatives have been reviewed with the patient and her daughter who is present at bedside.  They understand and agree to proceed.  Ozell Fell, M.D. 06/18/2024 9:55 AM

## 2024-06-19 ENCOUNTER — Encounter (HOSPITAL_COMMUNITY)
Admission: AD | Disposition: A | Discharge status: Home Health | Payer: Self-pay | Source: Other Acute Inpatient Hospital | Attending: Cardiology

## 2024-06-19 ENCOUNTER — Inpatient Hospital Stay (HOSPITAL_COMMUNITY): Admitting: Anesthesiology

## 2024-06-19 ENCOUNTER — Encounter (HOSPITAL_COMMUNITY): Payer: Self-pay | Admitting: Cardiology

## 2024-06-19 DIAGNOSIS — K31811 Angiodysplasia of stomach and duodenum with bleeding: Secondary | ICD-10-CM

## 2024-06-19 DIAGNOSIS — D62 Acute posthemorrhagic anemia: Secondary | ICD-10-CM

## 2024-06-19 DIAGNOSIS — I5033 Acute on chronic diastolic (congestive) heart failure: Secondary | ICD-10-CM | POA: Diagnosis not present

## 2024-06-19 DIAGNOSIS — I110 Hypertensive heart disease with heart failure: Secondary | ICD-10-CM

## 2024-06-19 DIAGNOSIS — I2510 Atherosclerotic heart disease of native coronary artery without angina pectoris: Secondary | ICD-10-CM | POA: Diagnosis not present

## 2024-06-19 DIAGNOSIS — Z7902 Long term (current) use of antithrombotics/antiplatelets: Secondary | ICD-10-CM

## 2024-06-19 DIAGNOSIS — K2289 Other specified disease of esophagus: Secondary | ICD-10-CM

## 2024-06-19 DIAGNOSIS — I214 Non-ST elevation (NSTEMI) myocardial infarction: Secondary | ICD-10-CM | POA: Diagnosis not present

## 2024-06-19 DIAGNOSIS — K5521 Angiodysplasia of colon with hemorrhage: Secondary | ICD-10-CM

## 2024-06-19 DIAGNOSIS — K921 Melena: Secondary | ICD-10-CM

## 2024-06-19 HISTORY — PX: ESOPHAGOGASTRODUODENOSCOPY: SHX5428

## 2024-06-19 LAB — POCT I-STAT 7, (LYTES, BLD GAS, ICA,H+H)
Acid-Base Excess: 7 mmol/L — ABNORMAL HIGH (ref 0.0–2.0)
Acid-Base Excess: 7 mmol/L — ABNORMAL HIGH (ref 0.0–2.0)
Bicarbonate: 35.5 mmol/L — ABNORMAL HIGH (ref 20.0–28.0)
Bicarbonate: 35.6 mmol/L — ABNORMAL HIGH (ref 20.0–28.0)
Calcium, Ion: 0.99 mmol/L — ABNORMAL LOW (ref 1.15–1.40)
Calcium, Ion: 0.99 mmol/L — ABNORMAL LOW (ref 1.15–1.40)
HCT: 30 % — ABNORMAL LOW (ref 36.0–46.0)
HCT: 30 % — ABNORMAL LOW (ref 36.0–46.0)
Hemoglobin: 10.2 g/dL — ABNORMAL LOW (ref 12.0–15.0)
Hemoglobin: 10.2 g/dL — ABNORMAL LOW (ref 12.0–15.0)
O2 Saturation: 62 %
O2 Saturation: 78 %
Potassium: 3.4 mmol/L — ABNORMAL LOW (ref 3.5–5.1)
Potassium: 3.5 mmol/L (ref 3.5–5.1)
Sodium: 131 mmol/L — ABNORMAL LOW (ref 135–145)
Sodium: 132 mmol/L — ABNORMAL LOW (ref 135–145)
TCO2: 38 mmol/L — ABNORMAL HIGH (ref 22–32)
TCO2: 38 mmol/L — ABNORMAL HIGH (ref 22–32)
pCO2 arterial: 72.7 mmHg (ref 32–48)
pCO2 arterial: 73.7 mmHg (ref 32–48)
pH, Arterial: 7.291 — ABNORMAL LOW (ref 7.35–7.45)
pH, Arterial: 7.298 — ABNORMAL LOW (ref 7.35–7.45)
pO2, Arterial: 38 mmHg — CL (ref 83–108)
pO2, Arterial: 49 mmHg — ABNORMAL LOW (ref 83–108)

## 2024-06-19 LAB — BASIC METABOLIC PANEL WITH GFR
Anion gap: 6 (ref 5–15)
BUN: 12 mg/dL (ref 8–23)
CO2: 34 mmol/L — ABNORMAL HIGH (ref 22–32)
Calcium: 7.2 mg/dL — ABNORMAL LOW (ref 8.9–10.3)
Chloride: 98 mmol/L (ref 98–111)
Creatinine, Ser: 0.6 mg/dL (ref 0.44–1.00)
GFR, Estimated: >60 mL/min (ref >60)
Glucose, Bld: 96 mg/dL (ref 70–99)
Potassium: 3.7 mmol/L (ref 3.5–5.1)
Sodium: 138 mmol/L (ref 135–145)

## 2024-06-19 LAB — CBC
HCT: 34.4 % — ABNORMAL LOW (ref 36.0–46.0)
Hemoglobin: 10.3 g/dL — ABNORMAL LOW (ref 12.0–15.0)
MCH: 29.1 pg (ref 26.0–34.0)
MCHC: 29.9 g/dL — ABNORMAL LOW (ref 30.0–36.0)
MCV: 97.2 fL (ref 80.0–100.0)
Platelets: 253 K/uL (ref 150–400)
RBC: 3.54 MIL/uL — ABNORMAL LOW (ref 3.87–5.11)
RDW: 14.9 % (ref 11.5–15.5)
WBC: 6.3 K/uL (ref 4.0–10.5)
nRBC: 0 % (ref 0.0–0.2)

## 2024-06-19 LAB — POCT ACTIVATED CLOTTING TIME
Activated Clotting Time: 325 s
Activated Clotting Time: 245 s
Activated Clotting Time: 302 s
Activated Clotting Time: 331 s

## 2024-06-19 LAB — ABO/RH: ABO/RH(D): A POS

## 2024-06-19 LAB — TYPE AND SCREEN
ABO/RH(D): A POS
Antibody Screen: NEGATIVE

## 2024-06-19 LAB — MAGNESIUM: Magnesium: 1.8 mg/dL (ref 1.7–2.4)

## 2024-06-19 SURGERY — EGD (ESOPHAGOGASTRODUODENOSCOPY)
Anesthesia: Monitor Anesthesia Care

## 2024-06-19 MED ORDER — PROPOFOL 500 MG/50ML IV EMUL
INTRAVENOUS | Status: DC | PRN
Start: 2024-06-19 — End: 2024-06-19
  Administered 2024-06-19: 50 ug/kg/min via INTRAVENOUS

## 2024-06-19 MED ORDER — SODIUM CHLORIDE 0.9 % IV SOLN: INTRAVENOUS | Status: DC

## 2024-06-19 MED ORDER — PROPOFOL 10 MG/ML IV BOLUS
INTRAVENOUS | Status: DC | PRN
  Administered 2024-06-19: 20 mg via INTRAVENOUS
  Administered 2024-06-19: 30 mg via INTRAVENOUS

## 2024-06-19 MED ORDER — PHENYLEPHRINE 80 MCG/ML (10ML) SYRINGE FOR IV PUSH (FOR BLOOD PRESSURE SUPPORT)
PREFILLED_SYRINGE | INTRAVENOUS | Status: DC | PRN
Start: 2024-06-19 — End: 2024-06-19
  Administered 2024-06-19: 80 ug via INTRAVENOUS
  Administered 2024-06-19: 160 ug via INTRAVENOUS
  Administered 2024-06-19: 80 ug via INTRAVENOUS
  Administered 2024-06-19 (×2): 160 ug via INTRAVENOUS

## 2024-06-19 MED ORDER — PANTOPRAZOLE SODIUM 40 MG PO TBEC
40.0000 mg | DELAYED_RELEASE_TABLET | Freq: Two times a day (BID) | ORAL | Status: DC
  Administered 2024-06-19 – 2024-06-21 (×4): 40 mg via ORAL
  Filled 2024-06-19 (×4): qty 1

## 2024-06-19 MED ORDER — GLUCAGON HCL RDNA (DIAGNOSTIC) 1 MG IJ SOLR
INTRAMUSCULAR | Status: DC | PRN
  Administered 2024-06-19 (×2): .5 mg via INTRAVENOUS

## 2024-06-19 MED ORDER — EPHEDRINE SULFATE-NACL 50-0.9 MG/10ML-% IV SOSY
PREFILLED_SYRINGE | INTRAVENOUS | Status: DC | PRN
  Administered 2024-06-19: 5 mg via INTRAVENOUS

## 2024-06-19 MED ORDER — FUROSEMIDE 10 MG/ML IJ SOLN
40.0000 mg | Freq: Once | INTRAMUSCULAR | Status: AC
  Administered 2024-06-19: 40 mg via INTRAVENOUS
  Filled 2024-06-19: qty 4

## 2024-06-19 MED ORDER — GLUCAGON HCL RDNA (DIAGNOSTIC) 1 MG IJ SOLR
INTRAMUSCULAR | Status: AC
  Filled 2024-06-19: qty 1

## 2024-06-19 MED ORDER — ALBUMIN HUMAN 5 % IV SOLN: INTRAVENOUS | Status: DC | PRN

## 2024-06-19 MED ORDER — IPRATROPIUM-ALBUTEROL 0.5-2.5 (3) MG/3ML IN SOLN
RESPIRATORY_TRACT | Status: AC
  Filled 2024-06-19: qty 3

## 2024-06-19 NOTE — Consult Note (Addendum)
 Consultation Note   Referring Provider:  [facility] PCP: [facility] Primary Gastroenterologist:   Sampson      Reason for Consultation: GI bleed with black stool DOA: 06/13/2024         Hospital Day: 7   ASSESSMENT    73 y.o. year old female with a medical history including but not limited to atrial fibrillation on Eliquis , COPD on home O2 as needed, CAD. Admitted with NSTEMI  NSTEMI, s/p atherectomy / PCI of the RCA on 06/18/2024.  Black stool ( x1) yesterday evening    Presumably melena in setting of recent antiplatelet agents / anticoagulation (Brilinta , Plavix , aspirin , heparin ).  Hemodynamically stable .  Hemoglobin stable as of 4 AM  (updated labs are pending ). Bleeding possibly 2/2 to erosive disease / PUD / AVMs.   Acute on chronic heart failure with preserved EF  Afib Home Eliquis  on hold for several days.    COPD on home O2 as needed  See PMH for additional history  Principal Problem:   NSTEMI (non-ST elevated myocardial infarction) (HCC) Active Problems:   Acute on chronic heart failure with preserved ejection fraction (HFpEF) (HCC)     PLAN:   -- Keep n.p.o. -- Twice daily IV PPI --Monitor H&H, transfuse as needed -- Most likely will get EGD today.  The risks and benefits of EGD with possible biopsies were discussed with the patient who agrees to proceed.    HPI   73 y.o. year old female with a medical history including but not limited to atrial fibrillation on Eliquis , COPD on home O2 as needed, CAD.   Patient admitted to [facility] several days ago with chest pain and found to have NSTEMI.  Underwent cardiac catheterization on 7/3 and found to have a stenosis of RCA.  Transferred to [facility] for further intervention.  On 7 7 she underwent atherectomy/PCI.  Sometime yesterday evening following the procedure she had an episode of dark stool.  No reported BMs or dark stool since.  Her baseline  hemoglobin is 12.7.  Early on in this admission hgb declined into the mid 10 range where it has remained stable as of 4 AM today.  Her BUN is normal.  She is hemodynamically stable.   Patient denies any prior history of gastrointestinal bleeding.  She has never had an upper endoscopy or colonoscopy.  She takes Eliquis  at home (on hold since admission).  Denies NSAID use.  She has no localizing GI symptoms such as nausea, vomiting or abdominal pain.  She denies any chronic GI problems   Labs and Imaging: Recent Labs    06/17/24 0327 06/18/24 0530 06/18/24 1504 06/18/24 1528 06/19/24 0417  WBC 7.1 6.4  --   --  6.3  HGB 10.7* 9.7* 10.2* 10.2* 10.3*  HCT 37.2 32.5* 30.0* 30.0* 34.4*  MCV 98.4 95.6  --   --  97.2  PLT 230 230  --   --  253   Recent Labs    06/18/24 0530 06/18/24 0938 06/18/24 1504 06/18/24 1528 06/19/24 0417  NA 139 142 132* 131* 138  K 3.4* 3.6 3.5 3.4* 3.7  CL 95* 96*  --   --  98  CO2 36* 34*  --   --  34*  GLUCOSE 100* 96  --   --  96  BUN 15 14  --   --  12  CREATININE 0.58 0.65  --   --  0.60  CALCIUM  7.5* 7.6*  --   --  7.2*    Pertinent GI Studies   Reportedly none  Past Medical History:  Diagnosis Date   Atrial fibrillation (HCC)    CHF (congestive heart failure) (HCC)    Complication of anesthesia    VERY slow to wake after hysterectomy   COPD (chronic obstructive pulmonary disease) (HCC)    Enlarged thyroid     has been referred to [facility]   Hyperlipidemia    Hypertension    Smokers' cough (HCC)    Tobacco use    Wears dentures    Full upper and lower    Past Surgical History:  Procedure Laterality Date   ABDOMINAL HYSTERECTOMY  1990   BREAST LUMPECTOMY Right 1974   CATARACT EXTRACTION W/PHACO Right 06/21/2022   Procedure: CATARACT EXTRACTION PHACO AND INTRAOCULAR LENS PLACEMENT (IOC) RIGHT 9.09 00:51.1;  Surgeon: Jaye Fallow, MD;  Location: [facility];  Service: Ophthalmology;  Laterality: Right;   CATARACT EXTRACTION  W/PHACO Left 07/05/2022   Procedure: CATARACT EXTRACTION PHACO AND INTRAOCULAR LENS PLACEMENT (IOC) LEFT 7.05 00:45.9;  Surgeon: Jaye Fallow, MD;  Location: [facility];  Service: Ophthalmology;  Laterality: Left;   CHOLECYSTECTOMY  2013   CORONARY ATHERECTOMY N/A 06/18/2024   Procedure: CORONARY ATHERECTOMY;  Surgeon: Anner Alm ORN, MD;  [facility] INVASIVE CV LAB;  Service: Cardiovascular;  Laterality: N/A;   CORONARY IMAGING/OCT N/A 06/18/2024   Procedure: CORONARY IMAGING/OCT;  Surgeon: Anner Alm ORN, MD;  Location: [facility] INVASIVE CV LAB;  Service: Cardiovascular;  Laterality: N/A;   CORONARY STENT INTERVENTION N/A 06/18/2024   Procedure: CORONARY STENT INTERVENTION;  Surgeon: Anner Alm ORN, MD;  Location: [facility] INVASIVE CV LAB;  Service: Cardiovascular;  Laterality: N/A;  RCA   LEFT HEART CATH AND CORONARY ANGIOGRAPHY N/A 06/13/2024   Procedure: LEFT HEART CATH AND CORONARY ANGIOGRAPHY;  Surgeon: Anner Alm ORN, MD;  Location: [facility] INVASIVE CV LAB;  Service: Cardiovascular;  Laterality: N/A;    Family History  Problem Relation Age of Onset   Hypertension Mother    Heart disease Mother        CABG   Heart disease Father    Heart attack Father     Prior to Admission medications   Medication Sig Start Date End Date Taking? Authorizing Provider  acetaminophen  (TYLENOL ) 650 MG CR tablet Take 1,300 mg by mouth 2 (two) times daily.    [provider]  albuterol  (PROVENTIL ) (2.5 MG/3ML) 0.083% nebulizer solution Take 3 mLs (2.5 mg total) by nebulization every 6 (six) hours as needed for wheezing or shortness of breath. 03/11/22   Awanda City, MD  apixaban  (ELIQUIS ) 5 MG TABS tablet Take 1 tablet (5 mg total) by mouth 2 (two) times daily. 12/21/23   Gollan, Timothy J, MD  aspirin  81 MG chewable tablet Chew 1 tablet (81 mg total) by mouth before cath procedure. 06/14/24   Wouk, Devaughn Sayres, MD  atorvastatin  (LIPITOR) 40 MG tablet Take 1 tablet (40 mg total) by mouth daily.  06/14/24   Wouk, Devaughn Sayres, MD  Biotin 10 MG CAPS  05/26/22   [provider]  bisoprolol  (ZEBETA ) 5 MG tablet TAKE 1 TABLET BY MOUTH EVERY DAY Patient taking differently: Take 5 mg by mouth 2 (two) times daily. 05/02/23   Gollan,  Evalene PARAS, MD  Budeson-Glycopyrrol-Formoterol  (BREZTRI  AEROSPHERE) 160-9-4.8 MCG/ACT AERO Inhale 2 puffs into the lungs in the morning and at bedtime. 09/01/22   Tamea Dedra CROME, MD  calcitRIOL (ROCALTROL) 0.25 MCG capsule Take 0.25 mcg by mouth 2 (two) times daily. 09/04/23   [provider]  diltiazem  (CARDIZEM  CD) 180 MG 24 hr capsule TAKE 1 CAPSULE(180 MG) BY MOUTH DAILY 04/17/23   Gollan, Timothy J, MD  diltiazem  (CARDIZEM ) 30 MG tablet Take 1 tablet (30 mg total) by mouth as needed (atrial fibrillation). 04/18/22   Gollan, Timothy J, MD  famotidine  (PEPCID ) 40 MG tablet Take 40 mg by mouth 2 (two) times daily. 02/14/22   [provider]  furosemide  (LASIX ) 80 MG tablet Take 1 tablet (80 mg total) by mouth daily. 02/07/23   Gollan, Timothy J, MD  gabapentin  (NEURONTIN ) 300 MG capsule Take 300 mg every am & 600 in the pm 02/23/22   [provider]  levothyroxine (SYNTHROID) 100 MCG tablet Take 100 mcg by mouth daily before breakfast.    [provider]  mirtazapine  (REMERON ) 45 MG tablet Take 45 mg by mouth at bedtime. 02/01/22   [provider]  OXYGEN  Inhale into the lungs as needed. 2-3 Liters    [provider]  potassium chloride  SA (KLOR-CON  M) 20 MEQ tablet Take 1 tablet (20 mEq total) by mouth 2 (two) times daily. 06/05/23   Gollan, Timothy J, MD  triamcinolone ointment (KENALOG) 0.5 % Apply 1 Application topically 2 (two) times daily. 05/20/24   [provider]    Current Facility-Administered Medications  Medication Dose Route Frequency Provider Last Rate Last Admin   0.9 %  sodium chloride  infusion  250 mL Intravenous PRN Anner Alm ORN, MD       acetaminophen  (TYLENOL ) tablet 650 mg  650 mg  Oral Q4H PRN Osude, Nkiru C, MD   650 mg at 06/18/24 1952   albuterol  (PROVENTIL ) (2.5 MG/3ML) 0.083% nebulizer solution 2.5 mg  2.5 mg Nebulization Q4H PRN Zhao, Xika, NP       aspirin  EC tablet 81 mg  81 mg Oral Daily Osude, Nkiru C, MD   81 mg at 06/17/24 1013   atorvastatin  (LIPITOR) tablet 40 mg  40 mg Oral Daily Osude, Nkiru C, MD   40 mg at 06/18/24 9065   bisoprolol  (ZEBETA ) tablet 5 mg  5 mg Oral Daily Osude, Nkiru C, MD   5 mg at 06/18/24 0935   budesonide -glycopyrrolate -formoterol  (BREZTRI ) 160-9-4.8 MCG/ACT inhaler 2 puff  2 puff Inhalation BID Osude, Nkiru C, MD   2 puff at 06/19/24 9177   clopidogrel  (PLAVIX ) tablet 75 mg  75 mg Oral Daily Osude, Nkiru C, MD   75 mg at 06/18/24 0935   diltiazem  (CARDIZEM  CD) 24 hr capsule 180 mg  180 mg Oral QHS Osude, Nkiru C, MD   180 mg at 06/18/24 2140   gabapentin  (NEURONTIN ) capsule 300 mg  300 mg Oral q morning Osude, Nkiru C, MD   300 mg at 06/18/24 0934   gabapentin  (NEURONTIN ) capsule 600 mg  600 mg Oral QHS Osude, Nkiru C, MD   600 mg at 06/18/24 2139   ipratropium-albuterol  (DUONEB) 0.5-2.5 (3) MG/3ML nebulizer solution 3 mL  3 mL Nebulization TID Tolia, Sunit, DO   3 mL at 06/19/24 9177   mirtazapine  (REMERON  SOL-TAB) disintegrating tablet 45 mg  45 mg Oral QHS Osude, Nkiru C, MD   45 mg at 06/18/24 2140   nitroGLYCERIN  (NITROSTAT ) SL tablet 0.4  mg  0.4 mg Sublingual Q5 Min x 3 PRN Osude, Nkiru C, MD   0.4 mg at 06/16/24 2139   ondansetron  (ZOFRAN ) injection 4 mg  4 mg Intravenous Q6H PRN Osude, Nkiru C, MD       Oral care mouth rinse  15 mL Mouth Rinse PRN Tolia, Sunit, DO       pantoprazole  (PROTONIX ) injection 40 mg  40 mg Intravenous Q12H Salah, Husam M, MD   40 mg at 06/19/24 0030   sodium chloride  flush (NS) 0.9 % injection 3 mL  3 mL Intravenous Q12H Anner Alm ORN, MD       sodium chloride  flush (NS) 0.9 % injection 3 mL  3 mL Intravenous PRN Anner Alm ORN, MD        Allergies as of 06/13/2024   (No Known Allergies)     Social History   Socioeconomic History   Marital status: Widowed    Spouse name: Not on file   Number of children: Not on file   Years of education: Not on file   Highest education level: Not on file  Occupational History   Not on file  Tobacco Use   Smoking status: Every Day    Current packs/day: 0.25    Average packs/day: 0.3 packs/day for 40.0 years (10.0 ttl pk-yrs)    Types: Cigarettes   Smokeless tobacco: Never   Tobacco comments:    0.5PPD 05/31/2023  Vaping Use   Vaping status: Never Used  Substance and Sexual Activity   Alcohol  use: Yes    Alcohol /week: 7.0 standard drinks of alcohol     Types: 7 Glasses of wine per week    Comment: one glass of wine daily after dinner   Drug use: Never   Sexual activity: Not Currently  Other Topics Concern   Not on file  Social History Narrative   Not on file   Social Drivers of Health   Financial Resource Strain: Low Risk  (09/01/2023)   Received from [facility]   Overall Financial Resource Strain (CARDIA)    Difficulty of Paying Living Expenses: Not hard at all  Food Insecurity: No Food Insecurity (06/14/2024)   Hunger Vital Sign    Worried About Running Out of Food in the Last Year: Never true    Ran Out of Food in the Last Year: Never true  Transportation Needs: No Transportation Needs (06/14/2024)   PRAPARE - Administrator, Civil Service (Medical): No    Lack of Transportation (Non-Medical): No  Physical Activity: Not on file  Stress: Not on file  Social Connections: Moderately Isolated (06/14/2024)   Social Connection and Isolation Panel    Frequency of Communication with Friends and Family: More than three times a week    Frequency of Social Gatherings with Friends and Family: More than three times a week    Attends Religious Services: More than 4 times per year    Active Member of [employer] or Organizations: No    Attends Banker Meetings: Never    Marital Status: Widowed  Intimate  Partner Violence: Not At Risk (06/14/2024)   Humiliation, Afraid, Rape, and Kick questionnaire    Fear of Current or Ex-Partner: No    Emotionally Abused: No    Physically Abused: No    Sexually Abused: No     Code Status   Code Status: Full Code  Review of Systems: All systems reviewed and negative except where noted in HPI.  Physical Exam:  Vital signs in last 24 hours: Temp:  [97.6 F (36.4 C)-98.5 F (36.9 C)] 98.5 F (36.9 C) (07/09 0740) Pulse Rate:  [93-105] 96 (07/09 0740) Resp:  [16-28] 28 (07/09 0740) BP: (96-121)/(50-63) 117/60 (07/09 0740) SpO2:  [83 %-97 %] 97 % (07/09 0740) Weight:  [69.5 kg-69.8 kg] 69.8 kg (07/09 0533) Last BM Date : 06/18/24  General:  Pleasant female in NAD Psych:  Cooperative. Normal mood and affect Eyes: Pupils equal Ears:  Normal auditory acuity Nose: No deformity, discharge or lesions Neck:  Supple, no masses felt Lungs: A few bibasilar crackles   Heart: Mildly tachycardic .   Abdomen:  Soft, nondistended, nontender, active bowel sounds, no masses felt Rectal : Scant amount of very dark soft stool in vault Msk: Symmetrical without gross deformities.  Neurologic:  Alert, oriented, grossly normal neurologically Extremities : No edema Skin:  Intact without significant lesions.    Intake/Output from previous day: 07/08 0701 - 07/09 0700 In: 405.5 [P.O.:120; I.V.:285.5] Out: 625 [Urine:625] Intake/Output this shift:  No intake/output data recorded.   Vina Dasen, NP-C   06/19/2024, 8:36 AM  I have taken an interval history, thoroughly reviewed the chart and examined the patient. I agree with the Advanced Practitioner's note, impression and recommendations, and have recorded additional findings, impressions and recommendations below. I performed a substantive portion of this encounter (>50% time spent), including a complete performance of the medical decision making.  My additional thoughts are as follows:  Single episode of  melena last evening and acute blood loss anemia in the setting of an NSTEMI several days ago and complex RCA intervention yesterday.  Heparin  has been discontinued, she remains on aspirin  and Plavix .  Prehospital Eliquis  for A-fib has been held days ago.  Hemoglobin stable overnight and hemodynamically stable today.  Upper endoscopy warranted to try localizing the bleeding source due to believed to be upper GI in nature.  Whether or not any intervention could be taken remains to be seen depending on what we find because the patient is on DAPT. However, I think at least trying to localize the source will help her cardiology team make more informed decisions about antiplatelet and anticoagulant medicines going forward.   Rationale for and nature of the EGD described in detail for this patient along with risks and benefits and she was agreeable. Planning for this around the day today   The benefits and risks of the planned procedure(s) were described in detail with the patient or (when appropriate) their health care proxy.  Risks were outlined as including, but not limited to, bleeding, infection, perforation, adverse medication reaction leading to cardiac or pulmonary decompensation, pancreatitis (if ERCP).  The limitation of incomplete mucosal visualization was also discussed.  No guarantees or warranties were given.  Patient at increased risk for cardiopulmonary complications of procedure due to medical comorbidities.    Victory LITTIE Brand III [phone]

## 2024-06-19 NOTE — Progress Notes (Signed)
 Overnight Communication   Informed by the nurse that the patient had 1 episode of large bowel movement with melena. She's currently on aspirin  and Plavix  for PCI on 7/8. She remains hemodynamically stable. CBC, type and screen, and pantoprazole  40 mg IV BID were ordered.   I spoke with Dr. Charlanne from GI; tentative plan for EGD on 7/9, so we'll keep NPO. Holding off on starting heparin  for AF. Given PCI on 7/8 and stability, will continue ASA and Plavix  for now.   Gillian Cass, MD

## 2024-06-19 NOTE — Transfer of Care (Signed)
 Immediate Anesthesia Transfer of Care Note  Patient: Stacey Sandoval  Procedure(s) Performed: EGD (ESOPHAGOGASTRODUODENOSCOPY)  Patient Location: PACU  Anesthesia Type:MAC  Level of Consciousness: drowsy  Airway & Oxygen  Therapy: Patient Spontanous Breathing and Patient connected to face mask oxygen   Post-op Assessment: Report given to RN and Post -op Vital signs reviewed and stable  Post vital signs: Reviewed and stable  Last Vitals:  Vitals Value Taken Time  BP 94/44 06/19/24 12:41  Temp    Pulse 96 06/19/24 12:51  Resp 29 06/19/24 12:51  SpO2 92 % 06/19/24 12:51  Vitals shown include unfiled device data.  Last Pain:  Vitals:   06/19/24 1125  TempSrc: Temporal  PainSc: 0-No pain      Patients Stated Pain Goal: 0 (06/13/24 2053)  Complications: No notable events documented.

## 2024-06-19 NOTE — Plan of Care (Signed)
  Problem: Nutrition: Goal: Adequate nutrition will be maintained Outcome: Progressing   Problem: Activity: Goal: Risk for activity intolerance will decrease Outcome: Progressing   Problem: Elimination: Goal: Will not experience complications related to bowel motility Outcome: Progressing Goal: Will not experience complications related to urinary retention Outcome: Progressing   Problem: Skin Integrity: Goal: Risk for impaired skin integrity will decrease Outcome: Progressing

## 2024-06-19 NOTE — Progress Notes (Addendum)
 Progress Note  Patient Name: Stacey Sandoval Date of Encounter: 06/19/2024 [facility] Cardiologist: None   Interval Summary    Sleepy but no complaints, back on endoscopy  Vital Signs Vitals:   06/19/24 1320 06/19/24 1321 06/19/24 1330 06/19/24 1353  BP: (!) 103/47  (!) 106/53 (!) 97/52  Pulse: 90 90 87 88  Resp: (!) 25 (!) 32 (!) 24   Temp:    98 F (36.7 C)  TempSrc:    Oral  SpO2: 91% 91% 97% (!) 89%  Weight:      Height:        Intake/Output Summary (Last 24 hours) at 06/19/2024 1449 Last data filed at 06/19/2024 1239 Gross per 24 hour  Intake 685.49 ml  Output 625 ml  Net 60.49 ml      06/19/2024    5:33 AM 06/18/2024    9:00 AM 06/13/2024    8:53 PM  Last 3 Weights  Weight (lbs) 153 lb 14.4 oz 153 lb 4.8 oz 157 lb 1.6 oz  Weight (kg) 69.809 kg 69.536 kg 71.26 kg      Telemetry/ECG   Sinus Rhythm 80-90s - Personally Reviewed  Physical Exam  GEN: No acute distress, on 2L [place] Neck: No JVD Cardiac: RRR, no murmurs Respiratory: Crackles in bases, coarse rhonchi GI: Soft, nontender, non-distended  MS: No edema Right radial cath site stable  Assessment & Plan   73 y.o. female with a past medical history of paroxysmal atrial fibrillation, COPD, chronic HFpEF, current smoker x 40+ years, who was seen 06/13/2024 for the evaluation of a recent NSTEMI. Underwent cardiac cath at [facility] with focal lesion in the RCA needs CSI. Transferred to [facility] for intervention   NSTEMI -- Initially presented to [facility] with chest pain and found to have NSTEMI.  Underwent cardiac catheterization 7/3 with proximal RCA lesion of 95%, heavily calcified, mid RCA lesion 85% with 95% stenosis of sidebranch and acute margin.  Moderately calcified large RV marginal branch, mid RCA lesion 50% with proximal circumflex lesion of 70%.  Transfer to [facility] for further plans for intervention with CSI/arthrectomy of the RCA.  Initially planned for 7/7 but was significantly dyspneic and tachycardic  on exam having difficulty lying flat.   -- Underwent cardiac catheterization 7/8 with successful OTC guided CSI orbital atherectomy with DES x2.  Recommendations for DAPT with aspirin /Plavix  for 1 month, given the need for Eliquis .  Then plan to drop aspirin  after 30 days. -- No chest pain overnight, will need cardiac rehab prior to discharge -- Continue aspirin , Plavix , atorvastatin   Acute GI bleed -- Developed episode of melena with large black tarry bowel movement last evening.  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 --Continue Protonix  40 mg twice daily for 4 weeks then decrease to once daily for 4 weeks   Paroxysmal atrial fibrillation -- Currently maintaining sinus rhythm -- Continue bisoprolol  and diltiazem , BP somewhat soft making further titration difficult.  Would consider her a poor candidate for amiodarone  given significant underlying lung disease. -- IV heparin  stopped last evening in the setting of acute GI bleed, will follow-up on GI recommendations regarding antiplatelet/anticoagulation   Acute on Chronic diastolic HF -- Echo 7/2 with LVEF of 60 to 65%, no regional wall motion abnormality, normal RV, moderately enlarged left atria, mild MR -- BNP 625>>481, chest x-ray 7/7 with small bilateral effusions -- Still  with coarse rhonchi on exam, crackles in bases.  O2 sats in the low 90s on 2L -- will dose IV lasix  40mg  x1 now  -- ideally would plan to add SGLT2 prior to discharge, +/- spiro    Tobacco use COPD -- continues to require 2-3L -- continue Breztri , duoneb   Hypokalemia -- resolved    For questions or updates, please contact  [facility] Please consult [email or web address] for contact info under       Signed, Stacey Rummer, NP   Patient seen, examined. Available data reviewed. Agree with findings, assessment, and plan as outlined by  Stacey Rummer, NP.  Patient is independently interviewed and examined.  She is alert, oriented, no distress.  HEENT normal, JVP normal, lungs clear bilaterally, heart regular rate and rhythm 2/6 systolic murmur along the left sternal border, abdomen soft nontender, extremities with no edema.  GI notes reviewed.  EGD demonstrated a single bleeding angiodysplastic lesion that was treated with APC and clipping.  The patient is improved with respect to her diastolic heart failure.  We will need to discuss her anticoagulation/antiplatelet therapy with the gastroenterology team.  It may be best to keep her off aspirin  and try to treat her with clopidogrel  and apixaban  alone.  She may need some time before restarting apixaban .  Will review with Dr. Legrand tomorrow.  Otherwise, as outlined above.  Stacey Sandoval, M.D. 06/19/2024 5:23 PM

## 2024-06-19 NOTE — Plan of Care (Signed)
  Problem: Health Behavior/Discharge Planning: Goal: Ability to manage health-related needs will improve Outcome: Progressing   Problem: Clinical Measurements: Goal: Ability to maintain clinical measurements within normal limits will improve Outcome: Progressing Goal: Diagnostic test results will improve Outcome: Progressing Goal: Respiratory complications will improve Outcome: Progressing Goal: Cardiovascular complication will be avoided Outcome: Progressing   Problem: Activity: Goal: Risk for activity intolerance will decrease Outcome: Progressing   Problem: Nutrition: Goal: Adequate nutrition will be maintained Outcome: Progressing   Problem: Coping: Goal: Level of anxiety will decrease Outcome: Progressing   Problem: Elimination: Goal: Will not experience complications related to bowel motility Outcome: Progressing Goal: Will not experience complications related to urinary retention Outcome: Progressing   Problem: Pain Managment: Goal: General experience of comfort will improve and/or be controlled Outcome: Progressing   Problem: Safety: Goal: Ability to remain free from injury will improve Outcome: Progressing   Problem: Education: Goal: Understanding of cardiac disease, CV risk reduction, and recovery process will improve Outcome: Progressing   Problem: Activity: Goal: Ability to tolerate increased activity will improve Outcome: Progressing   Problem: Cardiac: Goal: Ability to achieve and maintain adequate cardiovascular perfusion will improve Outcome: Progressing   Problem: Health Behavior/Discharge Planning: Goal: Ability to safely manage health-related needs after discharge will improve Outcome: Progressing   Problem: Education: Goal: Understanding of CV disease, CV risk reduction, and recovery process will improve Outcome: Progressing

## 2024-06-19 NOTE — Interval H&P Note (Signed)
 History and Physical Interval Note:  06/19/2024 11:52 AM  Stacey Sandoval  has presented today for surgery, with the diagnosis of Gastrointestinal bleeding.  The various methods of treatment have been discussed with the patient and family. After consideration of risks, benefits and other options for treatment, the patient has consented to  Procedure(s): EGD (ESOPHAGOGASTRODUODENOSCOPY) (N/A) as a surgical intervention.  The patient's history has been reviewed, patient examined, no change in status, stable for surgery.  I have reviewed the patient's chart and labs.  Questions were answered to the patient's satisfaction.    Patient seen in endoscopy pre-procedure area and remains clinically stable for EGD.  Victory LITTIE Brand III

## 2024-06-19 NOTE — Anesthesia Postprocedure Evaluation (Signed)
 Anesthesia Post Note  Patient: Stacey Sandoval  Procedure(s) Performed: EGD (ESOPHAGOGASTRODUODENOSCOPY)     Patient location during evaluation: PACU Anesthesia Type: MAC Level of consciousness: awake Pain management: pain level controlled Vital Signs Assessment: post-procedure vital signs reviewed and stable Respiratory status: spontaneous breathing, nonlabored ventilation and respiratory function stable Cardiovascular status: stable and blood pressure returned to baseline Postop Assessment: no apparent nausea or vomiting Anesthetic complications: no   No notable events documented.  Last Vitals:  Vitals:   06/19/24 1330 06/19/24 1353  BP: (!) 106/53 (!) 97/52  Pulse: 87 88  Resp: (!) 24   Temp:  36.7 C  SpO2: 97% (!) 89%    Last Pain:  Vitals:   06/19/24 1353  TempSrc: Oral  PainSc:                  Delon Aisha Arch

## 2024-06-19 NOTE — H&P (View-Only) (Signed)
 Consultation Note   Referring Provider:  [facility] PCP: [facility] Primary Gastroenterologist:   Sampson      Reason for Consultation: GI bleed with black stool DOA: 06/13/2024         Hospital Day: 7   ASSESSMENT    73 y.o. year old female with a medical history including but not limited to atrial fibrillation on Eliquis , COPD on home O2 as needed, CAD. Admitted with NSTEMI  NSTEMI, s/p atherectomy / PCI of the RCA on 06/18/2024.  Black stool ( x1) yesterday evening    Presumably melena in setting of recent antiplatelet agents / anticoagulation (Brilinta , Plavix , aspirin , heparin ).  Hemodynamically stable .  Hemoglobin stable as of 4 AM  (updated labs are pending ). Bleeding possibly 2/2 to erosive disease / PUD / AVMs.   Acute on chronic heart failure with preserved EF  Afib Home Eliquis  on hold for several days.    COPD on home O2 as needed  See PMH for additional history  Principal Problem:   NSTEMI (non-ST elevated myocardial infarction) (HCC) Active Problems:   Acute on chronic heart failure with preserved ejection fraction (HFpEF) (HCC)     PLAN:   -- Keep n.p.o. -- Twice daily IV PPI --Monitor H&H, transfuse as needed -- Most likely will get EGD today.  The risks and benefits of EGD with possible biopsies were discussed with the patient who agrees to proceed.    HPI   73 y.o. year old female with a medical history including but not limited to atrial fibrillation on Eliquis , COPD on home O2 as needed, CAD.   Patient admitted to [facility] several days ago with chest pain and found to have NSTEMI.  Underwent cardiac catheterization on 7/3 and found to have a stenosis of RCA.  Transferred to [facility] for further intervention.  On 7 7 she underwent atherectomy/PCI.  Sometime yesterday evening following the procedure she had an episode of dark stool.  No reported BMs or dark stool since.  Her baseline  hemoglobin is 12.7.  Early on in this admission hgb declined into the mid 10 range where it has remained stable as of 4 AM today.  Her BUN is normal.  She is hemodynamically stable.   Patient denies any prior history of gastrointestinal bleeding.  She has never had an upper endoscopy or colonoscopy.  She takes Eliquis  at home (on hold since admission).  Denies NSAID use.  She has no localizing GI symptoms such as nausea, vomiting or abdominal pain.  She denies any chronic GI problems   Labs and Imaging: Recent Labs    06/17/24 0327 06/18/24 0530 06/18/24 1504 06/18/24 1528 06/19/24 0417  WBC 7.1 6.4  --   --  6.3  HGB 10.7* 9.7* 10.2* 10.2* 10.3*  HCT 37.2 32.5* 30.0* 30.0* 34.4*  MCV 98.4 95.6  --   --  97.2  PLT 230 230  --   --  253   Recent Labs    06/18/24 0530 06/18/24 0938 06/18/24 1504 06/18/24 1528 06/19/24 0417  NA 139 142 132* 131* 138  K 3.4* 3.6 3.5 3.4* 3.7  CL 95* 96*  --   --  98  CO2 36* 34*  --   --  34*  GLUCOSE 100* 96  --   --  96  BUN 15 14  --   --  12  CREATININE 0.58 0.65  --   --  0.60  CALCIUM  7.5* 7.6*  --   --  7.2*    Pertinent GI Studies   Reportedly none  Past Medical History:  Diagnosis Date   Atrial fibrillation (HCC)    CHF (congestive heart failure) (HCC)    Complication of anesthesia    VERY slow to wake after hysterectomy   COPD (chronic obstructive pulmonary disease) (HCC)    Enlarged thyroid     has been referred to [facility]   Hyperlipidemia    Hypertension    Smokers' cough (HCC)    Tobacco use    Wears dentures    Full upper and lower    Past Surgical History:  Procedure Laterality Date   ABDOMINAL HYSTERECTOMY  1990   BREAST LUMPECTOMY Right 1974   CATARACT EXTRACTION W/PHACO Right 06/21/2022   Procedure: CATARACT EXTRACTION PHACO AND INTRAOCULAR LENS PLACEMENT (IOC) RIGHT 9.09 00:51.1;  Surgeon: Jaye Fallow, MD;  Location: [facility];  Service: Ophthalmology;  Laterality: Right;   CATARACT EXTRACTION  W/PHACO Left 07/05/2022   Procedure: CATARACT EXTRACTION PHACO AND INTRAOCULAR LENS PLACEMENT (IOC) LEFT 7.05 00:45.9;  Surgeon: Jaye Fallow, MD;  Location: [facility];  Service: Ophthalmology;  Laterality: Left;   CHOLECYSTECTOMY  2013   CORONARY ATHERECTOMY N/A 06/18/2024   Procedure: CORONARY ATHERECTOMY;  Surgeon: Anner Alm ORN, MD;  Location[facility] INVASIVE CV LAB;  Service: Cardiovascular;  Laterality: N/A;   CORONARY IMAGING/OCT N/A 06/18/2024   Procedure: CORONARY IMAGING/OCT;  Surgeon: Anner Alm ORN, MD;  Location: [facility] INVASIVE CV LAB;  Service: Cardiovascular;  Laterality: N/A;   CORONARY STENT INTERVENTION N/A 06/18/2024   Procedure: CORONARY STENT INTERVENTION;  Surgeon: Anner Alm ORN, MD;  Location: [facility] INVASIVE CV LAB;  Service: Cardiovascular;  Laterality: N/A;  RCA   LEFT HEART CATH AND CORONARY ANGIOGRAPHY N/A 06/13/2024   Procedure: LEFT HEART CATH AND CORONARY ANGIOGRAPHY;  Surgeon: Anner Alm ORN, MD;  Location: [facility] INVASIVE CV LAB;  Service: Cardiovascular;  Laterality: N/A;    Family History  Problem Relation Age of Onset   Hypertension Mother    Heart disease Mother        CABG   Heart disease Father    Heart attack Father     Prior to Admission medications   Medication Sig Start Date End Date Taking? Authorizing Provider  acetaminophen  (TYLENOL ) 650 MG CR tablet Take 1,300 mg by mouth 2 (two) times daily.    [provider]  albuterol  (PROVENTIL ) (2.5 MG/3ML) 0.083% nebulizer solution Take 3 mLs (2.5 mg total) by nebulization every 6 (six) hours as needed for wheezing or shortness of breath. 03/11/22   Awanda City, MD  apixaban  (ELIQUIS ) 5 MG TABS tablet Take 1 tablet (5 mg total) by mouth 2 (two) times daily. 12/21/23   Gollan, Timothy J, MD  aspirin  81 MG chewable tablet Chew 1 tablet (81 mg total) by mouth before cath procedure. 06/14/24   Wouk, Devaughn Sayres, MD  atorvastatin  (LIPITOR) 40 MG tablet Take 1 tablet (40 mg total) by mouth daily.  06/14/24   Wouk, Devaughn Sayres, MD  Biotin 10 MG CAPS  05/26/22   [provider]  bisoprolol  (ZEBETA ) 5 MG tablet TAKE 1 TABLET BY MOUTH EVERY DAY Patient taking differently: Take 5 mg by mouth 2 (two) times daily. 05/02/23   Gollan,  Evalene PARAS, MD  Budeson-Glycopyrrol-Formoterol  (BREZTRI  AEROSPHERE) 160-9-4.8 MCG/ACT AERO Inhale 2 puffs into the lungs in the morning and at bedtime. 09/01/22   Tamea Dedra CROME, MD  calcitRIOL (ROCALTROL) 0.25 MCG capsule Take 0.25 mcg by mouth 2 (two) times daily. 09/04/23   [provider]  diltiazem  (CARDIZEM  CD) 180 MG 24 hr capsule TAKE 1 CAPSULE(180 MG) BY MOUTH DAILY 04/17/23   Gollan, Timothy J, MD  diltiazem  (CARDIZEM ) 30 MG tablet Take 1 tablet (30 mg total) by mouth as needed (atrial fibrillation). 04/18/22   Gollan, Timothy J, MD  famotidine  (PEPCID ) 40 MG tablet Take 40 mg by mouth 2 (two) times daily. 02/14/22   [provider]  furosemide  (LASIX ) 80 MG tablet Take 1 tablet (80 mg total) by mouth daily. 02/07/23   Gollan, Timothy J, MD  gabapentin  (NEURONTIN ) 300 MG capsule Take 300 mg every am & 600 in the pm 02/23/22   [provider]  levothyroxine (SYNTHROID) 100 MCG tablet Take 100 mcg by mouth daily before breakfast.    [provider]  mirtazapine  (REMERON ) 45 MG tablet Take 45 mg by mouth at bedtime. 02/01/22   [provider]  OXYGEN  Inhale into the lungs as needed. 2-3 Liters    [provider]  potassium chloride  SA (KLOR-CON  M) 20 MEQ tablet Take 1 tablet (20 mEq total) by mouth 2 (two) times daily. 06/05/23   Gollan, Timothy J, MD  triamcinolone ointment (KENALOG) 0.5 % Apply 1 Application topically 2 (two) times daily. 05/20/24   [provider]    Current Facility-Administered Medications  Medication Dose Route Frequency Provider Last Rate Last Admin   0.9 %  sodium chloride  infusion  250 mL Intravenous PRN Anner Alm ORN, MD       acetaminophen  (TYLENOL ) tablet 650 mg  650 mg  Oral Q4H PRN Osude, Nkiru C, MD   650 mg at 06/18/24 1952   albuterol  (PROVENTIL ) (2.5 MG/3ML) 0.083% nebulizer solution 2.5 mg  2.5 mg Nebulization Q4H PRN Zhao, Xika, NP       aspirin  EC tablet 81 mg  81 mg Oral Daily Osude, Nkiru C, MD   81 mg at 06/17/24 1013   atorvastatin  (LIPITOR) tablet 40 mg  40 mg Oral Daily Osude, Nkiru C, MD   40 mg at 06/18/24 9065   bisoprolol  (ZEBETA ) tablet 5 mg  5 mg Oral Daily Osude, Nkiru C, MD   5 mg at 06/18/24 0935   budesonide -glycopyrrolate -formoterol  (BREZTRI ) 160-9-4.8 MCG/ACT inhaler 2 puff  2 puff Inhalation BID Osude, Nkiru C, MD   2 puff at 06/19/24 9177   clopidogrel  (PLAVIX ) tablet 75 mg  75 mg Oral Daily Osude, Nkiru C, MD   75 mg at 06/18/24 0935   diltiazem  (CARDIZEM  CD) 24 hr capsule 180 mg  180 mg Oral QHS Osude, Nkiru C, MD   180 mg at 06/18/24 2140   gabapentin  (NEURONTIN ) capsule 300 mg  300 mg Oral q morning Osude, Nkiru C, MD   300 mg at 06/18/24 0934   gabapentin  (NEURONTIN ) capsule 600 mg  600 mg Oral QHS Osude, Nkiru C, MD   600 mg at 06/18/24 2139   ipratropium-albuterol  (DUONEB) 0.5-2.5 (3) MG/3ML nebulizer solution 3 mL  3 mL Nebulization TID Tolia, Sunit, DO   3 mL at 06/19/24 9177   mirtazapine  (REMERON  SOL-TAB) disintegrating tablet 45 mg  45 mg Oral QHS Osude, Nkiru C, MD   45 mg at 06/18/24 2140   nitroGLYCERIN  (NITROSTAT ) SL tablet 0.4  mg  0.4 mg Sublingual Q5 Min x 3 PRN Osude, Nkiru C, MD   0.4 mg at 06/16/24 2139   ondansetron  (ZOFRAN ) injection 4 mg  4 mg Intravenous Q6H PRN Osude, Nkiru C, MD       Oral care mouth rinse  15 mL Mouth Rinse PRN Tolia, Sunit, DO       pantoprazole  (PROTONIX ) injection 40 mg  40 mg Intravenous Q12H Salah, Husam M, MD   40 mg at 06/19/24 0030   sodium chloride  flush (NS) 0.9 % injection 3 mL  3 mL Intravenous Q12H Anner Alm ORN, MD       sodium chloride  flush (NS) 0.9 % injection 3 mL  3 mL Intravenous PRN Anner Alm ORN, MD        Allergies as of 06/13/2024   (No Known Allergies)     Social History   Socioeconomic History   Marital status: Widowed    Spouse name: Not on file   Number of children: Not on file   Years of education: Not on file   Highest education level: Not on file  Occupational History   Not on file  Tobacco Use   Smoking status: Every Day    Current packs/day: 0.25    Average packs/day: 0.3 packs/day for 40.0 years (10.0 ttl pk-yrs)    Types: Cigarettes   Smokeless tobacco: Never   Tobacco comments:    0.5PPD 05/31/2023  Vaping Use   Vaping status: Never Used  Substance and Sexual Activity   Alcohol  use: Yes    Alcohol /week: 7.0 standard drinks of alcohol     Types: 7 Glasses of wine per week    Comment: one glass of wine daily after dinner   Drug use: Never   Sexual activity: Not Currently  Other Topics Concern   Not on file  Social History Narrative   Not on file   Social Drivers of Health   Financial Resource Strain: Low Risk  (09/01/2023)   Received from [facility]   Overall Financial Resource Strain (CARDIA)    Difficulty of Paying Living Expenses: Not hard at all  Food Insecurity: No Food Insecurity (06/14/2024)   Hunger Vital Sign    Worried About Running Out of Food in the Last Year: Never true    Ran Out of Food in the Last Year: Never true  Transportation Needs: No Transportation Needs (06/14/2024)   PRAPARE - Administrator, Civil Service (Medical): No    Lack of Transportation (Non-Medical): No  Physical Activity: Not on file  Stress: Not on file  Social Connections: Moderately Isolated (06/14/2024)   Social Connection and Isolation Panel    Frequency of Communication with Friends and Family: More than three times a week    Frequency of Social Gatherings with Friends and Family: More than three times a week    Attends Religious Services: More than 4 times per year    Active Member of [employer] or Organizations: No    Attends Banker Meetings: Never    Marital Status: Widowed  Intimate  Partner Violence: Not At Risk (06/14/2024)   Humiliation, Afraid, Rape, and Kick questionnaire    Fear of Current or Ex-Partner: No    Emotionally Abused: No    Physically Abused: No    Sexually Abused: No     Code Status   Code Status: Full Code  Review of Systems: All systems reviewed and negative except where noted in HPI.  Physical Exam:  Vital signs in last 24 hours: Temp:  [97.6 F (36.4 C)-98.5 F (36.9 C)] 98.5 F (36.9 C) (07/09 0740) Pulse Rate:  [93-105] 96 (07/09 0740) Resp:  [16-28] 28 (07/09 0740) BP: (96-121)/(50-63) 117/60 (07/09 0740) SpO2:  [83 %-97 %] 97 % (07/09 0740) Weight:  [69.5 kg-69.8 kg] 69.8 kg (07/09 0533) Last BM Date : 06/18/24  General:  Pleasant female in NAD Psych:  Cooperative. Normal mood and affect Eyes: Pupils equal Ears:  Normal auditory acuity Nose: No deformity, discharge or lesions Neck:  Supple, no masses felt Lungs: A few bibasilar crackles   Heart: Mildly tachycardic .   Abdomen:  Soft, nondistended, nontender, active bowel sounds, no masses felt Rectal : Scant amount of very dark soft stool in vault Msk: Symmetrical without gross deformities.  Neurologic:  Alert, oriented, grossly normal neurologically Extremities : No edema Skin:  Intact without significant lesions.    Intake/Output from previous day: 07/08 0701 - 07/09 0700 In: 405.5 [P.O.:120; I.V.:285.5] Out: 625 [Urine:625] Intake/Output this shift:  No intake/output data recorded.   Vina Dasen, NP-C   06/19/2024, 8:36 AM  I have taken an interval history, thoroughly reviewed the chart and examined the patient. I agree with the Advanced Practitioner's note, impression and recommendations, and have recorded additional findings, impressions and recommendations below. I performed a substantive portion of this encounter (>50% time spent), including a complete performance of the medical decision making.  My additional thoughts are as follows:  Single episode of  melena last evening and acute blood loss anemia in the setting of an NSTEMI several days ago and complex RCA intervention yesterday.  Heparin  has been discontinued, she remains on aspirin  and Plavix .  Prehospital Eliquis  for A-fib has been held days ago.  Hemoglobin stable overnight and hemodynamically stable today.  Upper endoscopy warranted to try localizing the bleeding source due to believed to be upper GI in nature.  Whether or not any intervention could be taken remains to be seen depending on what we find because the patient is on DAPT. However, I think at least trying to localize the source will help her cardiology team make more informed decisions about antiplatelet and anticoagulant medicines going forward.   Rationale for and nature of the EGD described in detail for this patient along with risks and benefits and she was agreeable. Planning for this around the day today   The benefits and risks of the planned procedure(s) were described in detail with the patient or (when appropriate) their health care proxy.  Risks were outlined as including, but not limited to, bleeding, infection, perforation, adverse medication reaction leading to cardiac or pulmonary decompensation, pancreatitis (if ERCP).  The limitation of incomplete mucosal visualization was also discussed.  No guarantees or warranties were given.  Patient at increased risk for cardiopulmonary complications of procedure due to medical comorbidities.    Victory LITTIE Brand III [phone]

## 2024-06-19 NOTE — Anesthesia Preprocedure Evaluation (Addendum)
 Anesthesia Evaluation  Patient identified by MRN, date of birth, ID band Patient awake    Reviewed: Allergy & Precautions, NPO status , Patient's Chart, lab work & pertinent test results  History of Anesthesia Complications (+) PROLONGED EMERGENCE and history of anesthetic complications  Airway Mallampati: III  TM Distance: >3 FB Neck ROM: Full    Dental  (+) Edentulous Upper, Edentulous Lower, Dental Advisory Given   Pulmonary COPD,  oxygen  dependent, Current Smoker and Patient abstained from smoking.   Pulmonary exam normal breath sounds clear to auscultation       Cardiovascular hypertension (bisoprolol ), Pt. on home beta blockers + CAD, + Past MI and +CHF  + dysrhythmias Atrial Fibrillation  Rhythm:Regular Rate:Normal  HLD, hypertrophic cardiomyopathy  Coronary Artherectomy 06/19/2024:   Lesion Segment #1 prox RCA to Mid RCA lesion is 85% stenosed with 70% stenosed side branch in Acute Mrg. Mid RCA lesion is 50% stenosed.   Orbital atherectomy followed by OCT performed to ensure adequate stent coverage and expansion.   A drug-eluting stent was successfully placed covering the significant 85% lesion and the more distal 50% lesion, using a STENT SYNERGY XD 3.0X32.  Stent was postdilated to 3.3 mm. Post intervention, there is a 0% residual stenosis.  TIMI-3 flow maintained. Post intervention, the side branch was reduced to 60% residual stenosis.   Lesion #2: Prox RCA lesion is 95% stenosed.   -------------------------------------------------------------------------   Orbital atherectomy followed by OCT performed to ensure adequate stent coverage and expansion.   A drug-eluting stent was successfully placed using a STENT SYNERGY XD 4.0X28. Stent was postdilated in tapered fashion from 4.6 to 4.1 mm; Post intervention, there is a 0% residual stenosis. TIMI-3 flow maintained  TTE 06/12/2024: IMPRESSIONS    1. Left ventricular  ejection fraction, by estimation, is 60 to 65%. The  left ventricle has normal function. The left ventricle has no regional  wall motion abnormalities. There is mild left ventricular hypertrophy of  the basal-septal segment. Left  ventricular diastolic parameters are consistent with Grade II diastolic  dysfunction (pseudonormalization).   2. Right ventricular systolic function is normal. The right ventricular  size is normal.   3. Left atrial size was mild to moderately dilated.   4. The mitral valve is normal in structure. Mild mitral valve  regurgitation.   5. The aortic valve was not well visualized. Aortic valve regurgitation  is not visualized. Aortic valve sclerosis is present, with no evidence of  aortic valve stenosis. Aortic valve mean gradient measures 7.0 mmHg.      Neuro/Psych negative neurological ROS     GI/Hepatic Neg liver ROS,GERD  Medicated,,  Endo/Other  neg diabetesHypothyroidism    Renal/GU negative Renal ROS     Musculoskeletal   Abdominal   Peds  Hematology  (+) Blood dyscrasia, anemia Lab Results      Component                Value               Date                      WBC                      6.3                 06/19/2024                HGB  10.3 (L)            06/19/2024                HCT                      34.4 (L)            06/19/2024                MCV                      97.2                06/19/2024                PLT                      253                 06/19/2024              Anesthesia Other Findings 73 y.o. year old female with a medical history including but not limited to atrial fibrillation on Eliquis , COPD on home O2 as needed, CAD. Admitted with NSTEMI s/p atherectomy / PCI of the RCA on 06/18/2024.   Reproductive/Obstetrics                              Anesthesia Physical Anesthesia Plan  ASA: 4  Anesthesia Plan: MAC   Post-op Pain Management: Minimal or no pain  anticipated   Induction: Intravenous  PONV Risk Score and Plan: 1 and Propofol  infusion, TIVA and Treatment may vary due to age or medical condition  Airway Management Planned: Natural Airway and Nasal Cannula  Additional Equipment:   Intra-op Plan:   Post-operative Plan:   Informed Consent: I have reviewed the patients History and Physical, chart, labs and discussed the procedure including the risks, benefits and alternatives for the proposed anesthesia with the patient or authorized representative who has indicated his/her understanding and acceptance.     Dental advisory given  Plan Discussed with: CRNA and Anesthesiologist  Anesthesia Plan Comments: (Discussed with patient risks of MAC including, but not limited to, minor pain or discomfort, hearing people in the room, and possible need for backup general anesthesia. Risks for general anesthesia also discussed including, but not limited to, sore throat, hoarse voice, chipped/damaged teeth, injury to vocal cords, nausea and vomiting, allergic reactions, lung infection, heart attack, stroke, and death. All questions answered. )         Anesthesia Quick Evaluation

## 2024-06-19 NOTE — Progress Notes (Signed)
 Patient unable to void via purewick on bedrest and becoming extremely uncomfortable. MD made aware, bladder scan volume greater than 485 at this time.  In and out cath ordered, patient wanted to try to wait until bedrest order was up at 2100.  Patient was able to void 625 on bedside commode after bed rest was completed.

## 2024-06-19 NOTE — Op Note (Signed)
 [facility] Patient Name: Stacey Sandoval Procedure Date : 06/19/2024 MRN: [id number] Attending MD: Victory CROME. Legrand , MD, [id number] Date of Birth: 15-May-1951 CSN: [id number] Age: 73 Admit Type: Inpatient Procedure:                Upper GI endoscopy Indications:              Acute post hemorrhagic anemia, Melena                           Melena began after aspirin  Plavix  and heparin  given                            for complex RCA procedure with stent(s) placement Providers:                Victory CROME. Legrand, MD, [facility] [place], Technician,                            Cena Currier, CRNA, Mliss Eagles, RN Referring MD:             Alm MICAEL Clay Medicines:                Monitored Anesthesia Care Complications:            No immediate complications. Estimated Blood Loss:     Estimated blood loss was minimal. Procedure:                Pre-Anesthesia Assessment:                           - Prior to the procedure, a History and Physical                            was performed, and patient medications and                            allergies were reviewed. The patient's tolerance of                            previous anesthesia was also reviewed. The risks                            and benefits of the procedure and the sedation                            options and risks were discussed with the patient.                            All questions were answered, and informed consent                            was obtained. Prior Anticoagulants: The patient                            last took heparin  1 day prior to the procedure and  last took Plavix  (clopidogrel ) on the day of the                            procedure. ASA Grade Assessment: IV - A patient                            with severe systemic disease that is a constant                            threat to life. After reviewing the risks and                            benefits, the patient was  deemed in satisfactory                            condition to undergo the procedure.                           After obtaining informed consent, the endoscope was                            passed under direct vision. Throughout the                            procedure, the patient's blood pressure, pulse, and                            oxygen  saturations were monitored continuously. The                            [id number]-[id number] ([id number]) Olympus endoscope was introduced                            through the mouth, and advanced to the third part                            of duodenum. The upper GI endoscopy was                            accomplished without difficulty. The patient                            tolerated the procedure well. Scope In: Scope Out: Findings:      Mucosal changes characterized by focal thick whitish-yellow exudate were       found in the distal esophagus. Somewhat mobile when probed with the       device but was not dislodged with lavage and no further tabs were made       to remove it and patient on DAPT      The exam of the esophagus was otherwise normal.      The stomach was normal.      The cardia and gastric fundus were normal on retroflexion.      Red blood was found in the second portion of the duodenum.  A single small angiodysplastic lesion with bleeding was found in the       second portion of the duodenum. Coagulation for hemostasis using argon       plasma (right colon setting) was successful and stop the bleeding. For       additional hemostasis and for closure of the APC induced mucosal defect,       two hemostatic clips were successfully placed (MR conditional).      The exam of the duodenum was otherwise normal. Impression:               - Thick whitish-yellow exudate mucosa in the                            esophagus. Unclear if there is underlying ulcer or                            other abnormality. No efforts were made to remove                             this in case there was ulceration underneath it in                            a patient on DAPT.                           - Normal stomach.                           - Blood in the second portion of the duodenum.                           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 Bleeding source found - treated with                            argon plasma coagulation (APC). Clips (MR                            conditional) were placed.                           - No specimens collected. Recommendation:           - Return patient to hospital ward for ongoing care.                           - Hemoglobin hematocrit tonight and CBC tomorrow                            morning                           Regular diet  Pantoprazole  40 mg by mouth twice daily for 4                            weeks, then decrease to once daily for 4 more weeks                           GI service will follow Procedure Code(s):        --- Professional ---                           [phone], Esophagogastroduodenoscopy, flexible,                            transoral; with control of bleeding, any method Diagnosis Code(s):        --- Professional ---                           K92.2, Gastrointestinal hemorrhage, unspecified                           K31.811, Angiodysplasia of stomach and duodenum                            with bleeding                           D62, Acute posthemorrhagic anemia                           K92.1, Melena (includes Hematochezia) CPT copyright 2022 [employer]. All rights reserved. The codes documented in this report are preliminary and upon coder review may  be revised to meet current compliance requirements. Woodrow Drab L. Legrand, MD 06/19/2024 12:48:59 PM This report has been signed electronically. Number of Addenda: 0

## 2024-06-19 NOTE — Progress Notes (Signed)
 Patient had large, black, tarry bowel movement. MD made aware and new orders received-hold heparin  IV at this time.

## 2024-06-20 ENCOUNTER — Inpatient Hospital Stay (HOSPITAL_COMMUNITY)

## 2024-06-20 DIAGNOSIS — I214 Non-ST elevation (NSTEMI) myocardial infarction: Secondary | ICD-10-CM | POA: Diagnosis not present

## 2024-06-20 LAB — BASIC METABOLIC PANEL WITH GFR
Anion gap: 8 (ref 5–15)
BUN: 8 mg/dL (ref 8–23)
CO2: 36 mmol/L — ABNORMAL HIGH (ref 22–32)
Calcium: 7.5 mg/dL — ABNORMAL LOW (ref 8.9–10.3)
Chloride: 98 mmol/L (ref 98–111)
Creatinine, Ser: 0.71 mg/dL (ref 0.44–1.00)
GFR, Estimated: >60 mL/min (ref >60)
Glucose, Bld: 103 mg/dL — ABNORMAL HIGH (ref 70–99)
Potassium: 4.1 mmol/L (ref 3.5–5.1)
Sodium: 142 mmol/L (ref 135–145)

## 2024-06-20 LAB — CBC
HCT: 33.2 % — ABNORMAL LOW (ref 36.0–46.0)
Hemoglobin: 9.7 g/dL — ABNORMAL LOW (ref 12.0–15.0)
MCH: 29.1 pg (ref 26.0–34.0)
MCHC: 29.2 g/dL — ABNORMAL LOW (ref 30.0–36.0)
MCV: 99.7 fL (ref 80.0–100.0)
Platelets: 270 K/uL (ref 150–400)
RBC: 3.33 MIL/uL — ABNORMAL LOW (ref 3.87–5.11)
RDW: 15.1 % (ref 11.5–15.5)
WBC: 8 K/uL (ref 4.0–10.5)
nRBC: 0.2 % (ref 0.0–0.2)

## 2024-06-20 LAB — BRAIN NATRIURETIC PEPTIDE: B Natriuretic Peptide: 303.6 pg/mL — ABNORMAL HIGH (ref 0.0–100.0)

## 2024-06-20 LAB — GLUCOSE, CAPILLARY: Glucose-Capillary: 90 mg/dL (ref 70–99)

## 2024-06-20 MED ORDER — DAPAGLIFLOZIN PROPANEDIOL 10 MG PO TABS
10.0000 mg | ORAL_TABLET | Freq: Every day | ORAL | Status: DC
  Administered 2024-06-20 – 2024-06-21 (×2): 10 mg via ORAL
  Filled 2024-06-20 (×2): qty 1

## 2024-06-20 MED ORDER — FUROSEMIDE 10 MG/ML IJ SOLN
60.0000 mg | Freq: Once | INTRAMUSCULAR | Status: AC
  Administered 2024-06-20: 60 mg via INTRAVENOUS
  Filled 2024-06-20: qty 6

## 2024-06-20 MED ORDER — DM-GUAIFENESIN ER 30-600 MG PO TB12
1.0000 | ORAL_TABLET | Freq: Two times a day (BID) | ORAL | Status: DC
  Administered 2024-06-20: 1 via ORAL
  Filled 2024-06-20 (×4): qty 1

## 2024-06-20 MED ORDER — HYDROXYZINE HCL 25 MG PO TABS
25.0000 mg | ORAL_TABLET | Freq: Every day | ORAL | Status: DC | PRN
  Administered 2024-06-20: 25 mg via ORAL
  Filled 2024-06-20: qty 1

## 2024-06-20 NOTE — Progress Notes (Addendum)
 Progress Note  Patient Name: Stacey Sandoval Date of Encounter: 06/20/2024 [facility] Cardiologist: None   Interval Summary    Sitting up in bed, no complaints. Remains on [place] 2-3L. No further melena  Vital Signs Vitals:   06/20/24 0404 06/20/24 0457 06/20/24 0746 06/20/24 0901  BP: 113/60  122/65 122/65  Pulse: 88     Resp: 20  20 16   Temp: 98.5 F (36.9 C)  98.1 F (36.7 C)   TempSrc: Oral  Oral   SpO2: 98%   96%  Weight:  70.4 kg    Height:        Intake/Output Summary (Last 24 hours) at 06/20/2024 1006 Last data filed at 06/20/2024 0916 Gross per 24 hour  Intake 460 ml  Output 800 ml  Net -340 ml      06/20/2024    4:57 AM 06/19/2024    5:33 AM 06/18/2024    9:00 AM  Last 3 Weights  Weight (lbs) 155 lb 3.3 oz 153 lb 14.4 oz 153 lb 4.8 oz  Weight (kg) 70.4 kg 69.809 kg 69.536 kg      Telemetry/ECG   Sinus Rhythm, some runs of atrial tachycardia - Personally Reviewed  Physical Exam  GEN: No acute distress, on 3L [place]. Chronically ill appearing older female Neck: No JVD Cardiac: RRR, soft systolic murmur, no rubs, or gallops.  Respiratory: Crackles L >R, with continued course rhonchi GI: Soft, nontender, non-distended  MS: No edema  Assessment & Plan   73 y.o. female with a past medical history of paroxysmal atrial fibrillation, COPD, chronic HFpEF, current smoker x 40+ years, who was seen 06/13/2024 for the evaluation of a recent NSTEMI. Underwent cardiac cath at [facility] with focal lesion in the RCA needs CSI. Transferred to [facility] for intervention   NSTEMI -- Initially presented to [facility] with chest pain and found to have NSTEMI.  Underwent cardiac catheterization 7/3 with proximal RCA lesion of 95%, heavily calcified, mid RCA lesion 85% with 95% stenosis of sidebranch and acute margin.  Moderately calcified large RV marginal branch, mid RCA lesion 50% with proximal circumflex lesion of 70%.  Transfer to [facility] for further plans for intervention with  CSI/arthrectomy of the RCA.  Initially planned for 7/7 but was significantly dyspneic and tachycardic on exam having difficulty lying flat.   -- Underwent cardiac catheterization 7/8 with successful OTC guided CSI orbital atherectomy with DES x2.  Recommendations for DAPT with aspirin /Plavix  for 1 month, given the need for Eliquis .  Then plan to drop aspirin  after 30 days. -- No chest pain overnight, will need cardiac rehab prior to discharge -- Continue aspirin , Plavix , atorvastatin    Acute GI bleed -- Developed episode of melena with large black tarry bowel movement evening of 7/8.  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 -- Continue Protonix  40 mg twice daily for 4 weeks then decrease to once daily for 4 weeks -- will follow up on final recommendations for OAC/antiplatelet from GI   Paroxysmal atrial fibrillation -- Currently maintaining sinus rhythm, but paroxysms of atrial tachycardia at times -- Continue bisoprolol  and diltiazem , BP somewhat soft making further titration difficult.  Would consider her a poor candidate for amiodarone  given significant underlying lung disease. -- IV heparin  stopped last evening in the setting of acute GI bleed, will follow-up on GI recommendations regarding antiplatelet/anticoagulation   Acute on Chronic diastolic HF -- Echo 7/2 with  LVEF of 60 to 65%, no regional wall motion abnormality, normal RV, moderately enlarged left atria, mild MR -- BNP 625>>481, chest x-ray 7/7 with small bilateral effusions -- Still with coarse rhonchi on exam, crackles. UOP 800cc today, weight is up 2lbs.  O2 sats in the low 90s on 2-3L -- add farxiga  -- check BNP -- give IV lasix  60mg  x1 now    Tobacco use COPD -- continues to require 2-3L -- continue Breztri , duoneb   Hypokalemia -- resolved   PT/OT eval ordered for dispo planning   Medical Readiness Date: 06/19/2024    For  questions or updates, please contact [facility] Please consult [email or web address] for contact info under       Signed, Manuelita Rummer, NP   Patient seen, examined. Available data reviewed. Agree with findings, assessment, and plan as outlined by Manuelita Rummer, NP.  Patient is independently interviewed and examined.  She is alert, oriented, elderly woman in no distress.  HEENT is normal, JVP is normal, lungs with few rales at the bases but improved aeration, heart is regular rate and rhythm with 2/6 systolic murmur along the left lower sternal border, abdomen is soft and nontender with no organomegaly, extremities have no edema.  Telemetry is reviewed and shows sinus rhythm predominantly, but there are paroxysms of atrial fibrillation noted.  Events of yesterday noted with GI bleeding and treatment of an angiodysplastic lesion in duodenum.  Appreciate GI consultation and management of this patient.  Hemoglobin appears stable with no further stigmata of active bleeding.  She currently is on DAPT with aspirin  and clopidogrel .  At some point she will need to go back on apixaban  when safe from a GI perspective.  I am inclined to stop aspirin  as soon as she starts back on apixaban  as I think her risk of triple therapy is probably prohibitive.  I think it would be a reasonable balance of bleeding and ischemic risk to have her on clopidogrel  and apixaban  when safe from a GI perspective.  Patient given another dose of IV furosemide  this morning due to her acute on chronic respiratory failure with hypoxemia.  Will transition her to oral Lasix  tomorrow and hopefully she will be medically stable to discharge home tomorrow.  She does have oxygen  at home and has been prescribed 2 to 3 L as needed.  I suspect she will need to use this after discharge based on her oxygen  requirements throughout her hospitalization.  Otherwise, as outlined above.  Ozell Fell, M.D. 06/20/2024 12:07 PM

## 2024-06-20 NOTE — Progress Notes (Addendum)
 Daily Progress Note  DOA: 06/13/2024 Hospital Day: 8   Cc: GI bleed  Brief History:  73 y.o. year old female with a medical history including but not limited to atrial fibrillation on Eliquis , COPD on home O2 as needed, CAD. Admitted with NSTEMI   ASSESSMENT    Upper GI bleed on antiplatelet medication / anticoagulation >> TODAY:  No further bleeding.  Normal bowel movement yesterday.  Hemoglobin down slightly overnight but overall stable at 9.7.    NSTEMI Acute on chronic heart failure with preserved EF   Afib Home Eliquis  on hold     COPD on home O2 as needed  Principal Problem:   NSTEMI (non-ST elevated myocardial infarction) (HCC) Active Problems:   Acute on chronic heart failure with preserved ejection fraction (HFpEF) (HCC)   Melena   ABLA (acute blood loss anemia)   AVM (arteriovenous malformation) of small bowel, acquired with hemorrhage   PLAN   --Pantoprazole  40 mg by mouth twice daily for 4 weeks, then decrease to once daily for 4 more weeks  -- Will check with Dr. Legrand to see when he is comfortable resuming anti-platelets tx / anticoagulation   Subjective   Feels fine today.  No abdominal pain.  Had a normal bowel movement yesterday, no BMs today.  No further overt GI bleeding   Objective   GI Studies:   06/19/2024 EGD - Thick whitish-yellow exudate mucosa in the esophagus. Unclear if there is underlying ulcer or other abnormality. No efforts were made to remove this in case there was ulceration underneath it in a patient on DAPT. - Normal stomach. - Blood in the second portion of the duodenum. 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 Bleeding source found - treated with argon plasma coagulation (APC). Clips (MR conditional) were placed. - No specimens collected.   Recent Labs    06/18/24 0530 06/18/24 1504 06/18/24 1528 06/19/24 0417 06/20/24 0425  WBC 6.4  --   --  6.3 8.0  HGB 9.7*   < > 10.2* 10.3* 9.7*  HCT 32.5*    < > 30.0* 34.4* 33.2*  MCV 95.6  --   --  97.2 99.7  PLT 230  --   --  253 270   < > = values in this interval not displayed.   No results for input(s): FOLATE, VITAMINB12, FERRITIN, TIBC, IRONPCTSAT in the last 72 hours. Recent Labs    06/18/24 0938 06/18/24 1504 06/18/24 1528 06/19/24 0417 06/20/24 0425  NA 142   < > 131* 138 142  K 3.6   < > 3.4* 3.7 4.1  CL 96*  --   --  98 98  CO2 34*  --   --  34* 36*  GLUCOSE 96  --   --  96 103*  BUN 14  --   --  12 8  CREATININE 0.65  --   --  0.60 0.71  CALCIUM  7.6*  --   --  7.2* 7.5*   < > = values in this interval not displayed.   No results for input(s): PROT, ALBUMIN , AST, ALT, ALKPHOS, BILITOT, BILIDIR, IBILI in the last 72 hours.    Imaging:  CARDIAC CATHETERIZATION Images from the original result were not included.    Lesion Segment #1 prox RCA to Mid RCA lesion is 85% stenosed with 70%  stenosed side branch in Acute Mrg. Mid RCA lesion is 50% stenosed.   Orbital atherectomy followed by OCT performed to  ensure adequate stent  coverage and expansion.   A drug-eluting stent was successfully placed covering the significant  85% lesion and the more distal 50% lesion, using a STENT SYNERGY XD  3.0X32.  Stent was postdilated to 3.3 mm. Post intervention, there is a 0%  residual stenosis.  TIMI-3 flow maintained. Post intervention, the side  branch was reduced to 60% residual stenosis.   Lesion #2: Prox RCA lesion is 95% stenosed.    -------------------------------------------------------------------------   Orbital atherectomy followed by OCT performed to ensure adequate stent  coverage and expansion.   A drug-eluting stent was successfully placed using a STENT SYNERGY XD  4.0X28. Stent was postdilated in tapered fashion from 4.6 to 4.1 mm; Post  intervention, there is a 0% residual stenosis. TIMI-3 flow maintained     -------------------------------------------------------------------------  Diagnostic  Dominance: Right     Intervention   Successful OCT guided CSI orbital atherectomy based DES PCI of the ostial  to distal RCA treated from significant calcified lesions and placing 2  overlapping Synergy XD DES stents: 3.0 mm x 32 mm postdilated to 3.3 mm,  with a proximal overlapping 4.0 mm x 28 mm stent deployed to 4.1 mm and  postdilated proximally to 4.5 mm based on OCT.  Transient hypotension during intervention requiring Levophed  that was  easily weaned off upon completion of the procedure.  Baseline hypoxia and hypercapnia with oxygen  saturations in the high 80s  with blood gas PCO2 in the 70s with pH roughly 7.3.   RECOMMENDATIONS   In the absence of any other complications or medical issues, we expect  the patient to be ready for discharge from an interventional cardiology  perspective on 06/19/2024.   Recommend to resume Apixaban , at currently prescribed dose and  frequency on 06/19/2024.   Recommend concurrent antiplatelet therapy of Aspirin  81 mg for 1 month  and Clopidogrel  75mg  daily for 12 months .   Will restart IV heparin  overnight tonight and then initiate DOAC on  06/19/2024  Alm Clay, MD       Scheduled inpatient medications:   aspirin  EC  81 mg Oral Daily   atorvastatin   40 mg Oral Daily   bisoprolol   5 mg Oral Daily   budesonide -glycopyrrolate -formoterol   2 puff Inhalation BID   clopidogrel   75 mg Oral Daily   diltiazem   180 mg Oral QHS   gabapentin   300 mg Oral q morning   gabapentin   600 mg Oral QHS   ipratropium-albuterol   3 mL Nebulization TID   mirtazapine   45 mg Oral QHS   pantoprazole   40 mg Oral BID AC   sodium chloride  flush  3 mL Intravenous Q12H   Continuous inpatient infusions:  PRN inpatient medications: acetaminophen , albuterol , nitroGLYCERIN , ondansetron  (ZOFRAN ) IV, mouth rinse, sodium chloride  flush  Vital signs in last 24 hours: Temp:  [97.3  F (36.3 C)-98.7 F (37.1 C)] 98.1 F (36.7 C) (07/10 0746) Pulse Rate:  [87-104] 88 (07/10 0404) Resp:  [16-43] 16 (07/10 0901) BP: (94-122)/(44-65) 122/65 (07/10 0901) SpO2:  [87 %-100 %] 96 % (07/10 0901) FiO2 (%):  [36 %] 36 % (07/10 0901) Weight:  [70.4 kg] 70.4 kg (07/10 0457) Last BM Date : 06/19/24  Intake/Output Summary (Last 24 hours) at 06/20/2024 0947 Last data filed at 06/20/2024 0916 Gross per 24 hour  Intake 460 ml  Output 800 ml  Net -340 ml    Intake/Output from previous day: 07/09 0701 - 07/10 0700 In: 460 [P.O.:60; I.V.:150; IV Piggyback:250] Out: -  Intake/Output this shift: Total I/O In: -  Out: 800 [Urine:800]   Physical Exam:  General: Alert female in NAD Heart:  Regular rate , + Murmur  Pulmonary: Normal respiratory effort on 02 per [place] Abdomen: Soft, nondistended, nontender. Normal bowel sounds. Neurologic: Alert and oriented Psych: Pleasant. Cooperative     LOS: 7 days   Vina Dasen ,NP 06/20/2024, 9:47 AM   I have taken an interval history, thoroughly reviewed the chart and examined the patient. I agree with the Advanced Practitioner's note, impression and recommendations, and have recorded additional findings, impressions and recommendations below. I performed a substantive portion of this encounter (>50% time spent), including a complete performance of the medical decision making.  My additional thoughts are as follows:  No evidence of rebleeding after yesterday's endoscopic invention.  She remains on Plavix  and aspirin  after recent RCA interventions.  I communicated with Dr. Wonda today regarding this patient's antiplatelet NOAC medicines. They feel that Plavix  must be continued without interruption, but that when she resumes Eliquis  they will stop the aspirin . My advice was to resume the Eliquis  5 days after the procedure, so 4 days from now as long as there is no recurrence of bleeding in the interim.  She will need some iron  tablets upon discharge (iron sulfate 325 mg tablets twice daily), take with a vitamin C tablet for better absorption.  Just let her know this will turn her stool dark and possibly black. She will need a CBC 7 to 10 days after discharge as well.  As needed GI clinic follow-up.  Inpatient service signing off for now, glad we could be of service.   Victory LITTIE Brand III Office:[phone]

## 2024-06-20 NOTE — Significant Event (Signed)
 Rapid Response Event Note   Reason for Call : SOB  Initial Focused Assessment:  I was notified by nursing staff regarding pt c/o SOB. Pt is alert, oriented and anxious. BBS CTA, diminished.  Prod cough. No distress. Pt stated she feels like her throat is getting tighter.   2322-97.59F, HR 93 SR, 104/58, RR 18 with sats 97% on 3L [place].   Interventions:  MADELENE    MD Notified: Per Primary RN Call Time: 2218 Arrival Time: 2230 End Time: 9941  Griselda Alm ORN, RN

## 2024-06-20 NOTE — Plan of Care (Signed)
  Problem: Clinical Measurements: Goal: Ability to maintain clinical measurements within normal limits will improve Outcome: Progressing Goal: Diagnostic test results will improve Outcome: Progressing Goal: Cardiovascular complication will be avoided Outcome: Progressing   Problem: Pain Managment: Goal: General experience of comfort will improve and/or be controlled Outcome: Progressing   Problem: Activity: Goal: Ability to tolerate increased activity will improve Outcome: Progressing

## 2024-06-20 NOTE — Plan of Care (Signed)
  Problem: Nutrition: Goal: Adequate nutrition will be maintained Outcome: Progressing   Problem: Activity: Goal: Risk for activity intolerance will decrease Outcome: Progressing   Problem: Elimination: Goal: Will not experience complications related to bowel motility Outcome: Progressing Goal: Will not experience complications related to urinary retention Outcome: Progressing   Problem: Safety: Goal: Ability to remain free from injury will improve Outcome: Progressing

## 2024-06-20 NOTE — Evaluation (Signed)
 Physical Therapy Evaluation Patient Details Name: Stacey Sandoval MRN: [id number] DOB: 04-Mar-1951 Today's Date: 06/20/2024  History of Present Illness  73 y.o. female admitted 7/3 for NESTEMI. Underwent cardiac catheterization 7/3. Underwent cardiac catheterization 7/8 with successful OTC guided CSI orbital atherectomy. Developed episode of melena with large black tarry bowel movement evening of 7/8. PMH: paroxysmal atrial fibrillation, COPD, chronic HFpEF.     Clinical Impression  Pt admitted with above diagnosis. Independent and fairly active PTA. States she lives with daughter who is available 24/7 at d/c to assist if needed. Able to stand and step pivot transfer to recliner today with CGA, mild instability, furniture surfing along bed but without overt buckling. Denies dizziness but MAP dropped to 63 during transfer in standing. Deferred gait for this reason. Improved BP upon sitting and performing isometrics of LEs with LEs elevated. Reviewed IS and flutter valve use. SpO2 mid 90s with activity on 4L. Anticipate rapid improvement in function as BP stabilizes and pt is able to mobilize more.  Pt currently with functional limitations due to the deficits listed below (see PT Problem List). Pt will benefit from acute skilled PT to increase their independence and safety with mobility to allow discharge.      Supine BP 100/51 HR 78 (MAP 66). After transfer to chair BP 79/60 HR 83 (MAP 71). While standing BP 84/51 HR 91 (MAP 63). Asymptomatic throughout.      If plan is discharge home, recommend the following: A little help with walking and/or transfers;A little help with bathing/dressing/bathroom;Assistance with cooking/housework;Assist for transportation;Help with stairs or ramp for entrance   Can travel by private vehicle        Equipment Recommendations None recommended by PT  Recommendations for Other Services       Functional Status Assessment Patient has had a recent decline in their  functional status and demonstrates the ability to make significant improvements in function in a reasonable and predictable amount of time.     Precautions / Restrictions Precautions Precautions: Fall Recall of Precautions/Restrictions: Intact Precaution/Restrictions Comments: Monitor BP Restrictions Weight Bearing Restrictions Per Provider Order: No      Mobility  Bed Mobility Overal bed mobility: Needs Assistance Bed Mobility: Supine to Sit     Supine to sit: Supervision     General bed mobility comments: Supervision for safety, good control rising to EOB. Denies dizziness.    Transfers Overall transfer level: Needs assistance Equipment used: None Transfers: Sit to/from Stand, Bed to chair/wheelchair/BSC Sit to Stand: Contact guard assist   Step pivot transfers: Contact guard assist       General transfer comment: CGA for safety, stood quickly after scooting to EOB. Cues for safety and awareness. Denies dizziness. CGA for step pivot, holding rail for support. No buckling but shows some increased sway.  MAP dropped to 63 in standing.    Ambulation/Gait               General Gait Details: Deferred due to hypotension with MAP <65  Stairs            Wheelchair Mobility     Tilt Bed    Modified Rankin (Stroke Patients Only)       Balance Overall balance assessment: Mild deficits observed, not formally tested  Pertinent Vitals/Pain Pain Assessment Pain Assessment: No/denies pain    Home Living Family/patient expects to be discharged to:: Private residence Living Arrangements: Children Available Help at Discharge: Family;Available 24 hours/day Type of Home: House Home Access: Stairs to enter Entrance Stairs-Rails: None Entrance Stairs-Number of Steps: 3   Home Layout: One level Home Equipment: Rollator (4 wheels);Cane - single point;BSC/3in1      Prior Function Prior Level of  Function : Independent/Modified Independent;Driving             Mobility Comments: Denies falls this year. Can drive but hasn't in quite some time. Uses SPC for gait. ADLs Comments: ind.     Extremity/Trunk Assessment   Upper Extremity Assessment Upper Extremity Assessment: Defer to OT evaluation    Lower Extremity Assessment Lower Extremity Assessment: Generalized weakness       Communication   Communication Communication: No apparent difficulties    Cognition Arousal: Alert Behavior During Therapy: Flat affect   PT - Cognitive impairments: No family/caregiver present to determine baseline                         Following commands: Intact       Cueing Cueing Techniques: Verbal cues     General Comments General comments (skin integrity, edema, etc.): Supine BP 100/51 HR 78 (MAP 66). After transfer to chair BP 79/60 HR 83 (MAP 71). While standing BP 84/51 HR 91 (MAP 63). Asymptomatic throughout.    Exercises General Exercises - Lower Extremity Ankle Circles/Pumps: AROM, Both, 10 reps, Seated Quad Sets: Strengthening, Both, 10 reps, Seated Gluteal Sets: Strengthening, Both, 10 reps, Seated   Assessment/Plan    PT Assessment Patient needs continued PT services  PT Problem List Decreased strength;Decreased activity tolerance;Decreased balance;Decreased mobility;Decreased knowledge of use of DME;Cardiopulmonary status limiting activity       PT Treatment Interventions DME instruction;Gait training;Stair training;Functional mobility training;Therapeutic activities;Therapeutic exercise;Balance training;Neuromuscular re-education;Patient/family education    PT Goals (Current goals can be found in the Care Plan section)  Acute Rehab PT Goals Patient Stated Goal: Get well return home, walk more PT Goal Formulation: With patient Time For Goal Achievement: 07/04/24 Potential to Achieve Goals: Good    Frequency Min 2X/week     Co-evaluation                AM-PAC PT 6 Clicks Mobility  Outcome Measure Help needed turning from your back to your side while in a flat bed without using bedrails?: A Little Help needed moving from lying on your back to sitting on the side of a flat bed without using bedrails?: A Little Help needed moving to and from a bed to a chair (including a wheelchair)?: A Little Help needed standing up from a chair using your arms (e.g., wheelchair or bedside chair)?: A Little Help needed to walk in hospital room?: A Little Help needed climbing 3-5 steps with a railing? : A Lot 6 Click Score: 17    End of Session Equipment Utilized During Treatment: Gait belt;Oxygen  Activity Tolerance: Treatment limited secondary to medical complications (Comment) (Hypotensive MAP <65 in standing.) Patient left: in chair;with call bell/phone within reach;with chair alarm set Nurse Communication:  (Spoke with NT  - person assist transfer) PT Visit Diagnosis: Unsteadiness on feet (R26.81);Other abnormalities of gait and mobility (R26.89);Muscle weakness (generalized) (M62.81);Difficulty in walking, not elsewhere classified (R26.2)    Time: 8376-8358 PT Time Calculation (min) (ACUTE ONLY): 18 min   Charges:   PT Evaluation $  PT Eval Low Complexity: 1 Low   PT General Charges $$ ACUTE PT VISIT: 1 Visit         Leontine Roads, PT, DPT [facility] Services Physical Therapist Office: [phone] Website: [email or web address]   Leontine GORMAN Roads 06/20/2024, 5:14 PM

## 2024-06-21 ENCOUNTER — Telehealth: Payer: Self-pay | Admitting: Cardiovascular Disease

## 2024-06-21 ENCOUNTER — Encounter (HOSPITAL_COMMUNITY): Payer: Self-pay | Admitting: Cardiology

## 2024-06-21 ENCOUNTER — Encounter (HOSPITAL_COMMUNITY): Payer: Self-pay

## 2024-06-21 ENCOUNTER — Other Ambulatory Visit (HOSPITAL_COMMUNITY): Payer: Self-pay

## 2024-06-21 DIAGNOSIS — I214 Non-ST elevation (NSTEMI) myocardial infarction: Secondary | ICD-10-CM | POA: Diagnosis not present

## 2024-06-21 LAB — CBC
HCT: 31.9 % — ABNORMAL LOW (ref 36.0–46.0)
Hemoglobin: 9.4 g/dL — ABNORMAL LOW (ref 12.0–15.0)
MCH: 28.7 pg (ref 26.0–34.0)
MCHC: 29.5 g/dL — ABNORMAL LOW (ref 30.0–36.0)
MCV: 97.3 fL (ref 80.0–100.0)
Platelets: 293 K/uL (ref 150–400)
RBC: 3.28 MIL/uL — ABNORMAL LOW (ref 3.87–5.11)
RDW: 15.1 % (ref 11.5–15.5)
WBC: 5.8 K/uL (ref 4.0–10.5)
nRBC: 0 % (ref 0.0–0.2)

## 2024-06-21 LAB — BASIC METABOLIC PANEL WITH GFR
Anion gap: 11 (ref 5–15)
BUN: 9 mg/dL (ref 8–23)
CO2: 34 mmol/L — ABNORMAL HIGH (ref 22–32)
Calcium: 7.3 mg/dL — ABNORMAL LOW (ref 8.9–10.3)
Chloride: 93 mmol/L — ABNORMAL LOW (ref 98–111)
Creatinine, Ser: 0.69 mg/dL (ref 0.44–1.00)
GFR, Estimated: >60 mL/min (ref >60)
Glucose, Bld: 88 mg/dL (ref 70–99)
Potassium: 3.1 mmol/L — ABNORMAL LOW (ref 3.5–5.1)
Sodium: 138 mmol/L (ref 135–145)

## 2024-06-21 MED ORDER — IRON 325 (65 FE) MG PO TABS
1.0000 | ORAL_TABLET | Freq: Two times a day (BID) | ORAL | 0 refills | Status: AC
  Filled 2024-06-21: qty 60, 30d supply, fill #0

## 2024-06-21 MED ORDER — FUROSEMIDE 20 MG PO TABS
20.0000 mg | ORAL_TABLET | Freq: Every day | ORAL | 2 refills | Status: DC | PRN
  Filled 2024-06-21: qty 30, 30d supply, fill #0

## 2024-06-21 MED ORDER — PANTOPRAZOLE SODIUM 40 MG PO TBEC
DELAYED_RELEASE_TABLET | ORAL | 0 refills | Status: DC
  Filled 2024-06-21: qty 90, 60d supply, fill #0

## 2024-06-21 MED ORDER — DAPAGLIFLOZIN PROPANEDIOL 10 MG PO TABS
10.0000 mg | ORAL_TABLET | Freq: Every day | ORAL | 1 refills | Status: DC
  Filled 2024-06-21: qty 90, 90d supply, fill #0

## 2024-06-21 MED ORDER — CLOPIDOGREL BISULFATE 75 MG PO TABS
75.0000 mg | ORAL_TABLET | Freq: Every day | ORAL | 2 refills | Status: DC
  Filled 2024-06-21: qty 90, 90d supply, fill #0

## 2024-06-21 MED ORDER — POLYETHYLENE GLYCOL 3350 17 G PO PACK
17.0000 g | PACK | Freq: Every day | ORAL | Status: DC
  Filled 2024-06-21: qty 1

## 2024-06-21 MED ORDER — SPIRONOLACTONE 12.5 MG HALF TABLET
12.5000 mg | ORAL_TABLET | Freq: Every day | ORAL | Status: DC
  Administered 2024-06-21: 12.5 mg via ORAL
  Filled 2024-06-21: qty 1

## 2024-06-21 MED ORDER — SALINE SPRAY 0.65 % NA SOLN
1.0000 | NASAL | Status: DC | PRN
  Filled 2024-06-21: qty 44

## 2024-06-21 MED ORDER — POTASSIUM CHLORIDE CRYS ER 20 MEQ PO TBCR: 60.0000 meq | EXTENDED_RELEASE_TABLET | Freq: Once | ORAL | Status: DC

## 2024-06-21 MED ORDER — DOCUSATE SODIUM 100 MG PO CAPS
100.0000 mg | ORAL_CAPSULE | Freq: Every day | ORAL | Status: DC
  Filled 2024-06-21: qty 1

## 2024-06-21 MED ORDER — APIXABAN 5 MG PO TABS: 5.0000 mg | ORAL_TABLET | Freq: Two times a day (BID) | ORAL | Status: AC

## 2024-06-21 MED ORDER — POTASSIUM CHLORIDE CRYS ER 10 MEQ PO TBCR
60.0000 meq | EXTENDED_RELEASE_TABLET | Freq: Once | ORAL | Status: AC
  Administered 2024-06-21: 60 meq via ORAL

## 2024-06-21 MED ORDER — SPIRONOLACTONE 25 MG PO TABS
12.5000 mg | ORAL_TABLET | Freq: Every day | ORAL | 0 refills | Status: DC
  Filled 2024-06-21: qty 30, 60d supply, fill #0

## 2024-06-21 MED ORDER — NITROGLYCERIN 0.4 MG SL SUBL
0.4000 mg | SUBLINGUAL_TABLET | SUBLINGUAL | 1 refills | Status: AC | PRN
  Filled 2024-06-21: qty 25, 8d supply, fill #0

## 2024-06-21 NOTE — Telephone Encounter (Signed)
 Left voicemail message to call back

## 2024-06-21 NOTE — Telephone Encounter (Signed)
 Daughter Lenis) stated patient is being discharged from the hospital and she has some concerns.  Daughter wants a call back directly from Dr. Gollan.

## 2024-06-21 NOTE — TOC Transition Note (Signed)
 Transition of Care [facility]) - Discharge Note   Patient Details  Name: Stacey Sandoval MRN: [id number] Date of Birth: 04/26/1951  [facility] CM/SW Contact:  Sudie Erminio Deems, RN Phone Number: 06/21/2024, 1:30 PM   Clinical Narrative:  Case Manager discussed home health needs with the patient and she is agreeable to [facility] PT/OT services. Patient does not have any agency preference. Referral submitted to [facility] and SOC to begin next week. Daughter to bring oxygen  for travel home. Case Manager verified with [employer] that the patient is on 2 liters continuous. No further needs identified at this time.   Final next level of care: Home w Home Health Services Barriers to Discharge: No Barriers Identified   Patient Goals and CMS Choice Patient states their goals for this hospitalization and ongoing recovery are:: plan to return home with support of daughter once stable.  Discharge Plan and Services Additional resources added to the After Visit Summary for   In-house Referral: NA Discharge Planning Services: CM Consult Post Acute Care Choice: NA            DME Agency: NA       HH Arranged: PT, OT HH Agency: [facility] Date [facility] Agency Contacted: 06/21/24 Time HH Agency Contacted: 1329 Representative spoke with at [facility] Agency: [place]  Social Drivers of Health (SDOH) Interventions SDOH Screenings   Food Insecurity: No Food Insecurity (06/14/2024)  Housing: Unknown (06/21/2024)  Transportation Needs: No Transportation Needs (06/21/2024)  Utilities: Not At Risk (06/14/2024)  Alcohol  Screen: Low Risk  (06/21/2024)  Financial Resource Strain: Low Risk  (06/21/2024)  Social Connections: Moderately Isolated (06/14/2024)  Tobacco Use: High Risk (06/19/2024)     Readmission Risk Interventions     No data to display

## 2024-06-21 NOTE — Plan of Care (Signed)
  Problem: Education: Goal: Understanding of cardiac disease, CV risk reduction, and recovery process will improve Outcome: Progressing   Problem: Activity: Goal: Ability to tolerate increased activity will improve Outcome: Progressing   Problem: Cardiac: Goal: Ability to achieve and maintain adequate cardiovascular perfusion will improve Outcome: Progressing   Problem: Health Behavior/Discharge Planning: Goal: Ability to safely manage health-related needs after discharge will improve Outcome: Progressing   Problem: Education: Goal: Understanding of CV disease, CV risk reduction, and recovery process will improve Outcome: Progressing   Problem: Activity: Goal: Ability to return to baseline activity level will improve Outcome: Progressing   Problem: Cardiovascular: Goal: Ability to achieve and maintain adequate cardiovascular perfusion will improve Outcome: Progressing Goal: Vascular access site(s) Level 0-1 will be maintained Outcome: Progressing   Problem: Health Behavior/Discharge Planning: Goal: Ability to safely manage health-related needs after discharge will improve Outcome: Progressing

## 2024-06-21 NOTE — Progress Notes (Signed)
 Physical Therapy Treatment Patient Details Name: Stacey Sandoval MRN: [id number] DOB: 02-Mar-1951 Today's Date: 06/21/2024   History of Present Illness 73 y.o. female admitted 7/3 for NESTEMI. Underwent cardiac catheterization 7/3. Underwent cardiac catheterization 7/8 with successful OTC guided CSI orbital atherectomy. Developed episode of melena with large black tarry bowel movement evening of 7/8. PMH: paroxysmal atrial fibrillation, COPD, chronic HFpEF.    PT Comments  Pt has progressed well toward goals, but is just shy of being safely independent and will need some PRN supervision from her dtr, will need to use oxygen  in the short term and recommended using her RW for increased safety until back to her baseline.  Emphasis on standing exercise, gait stability/stamina progression and safe negotiation of stairs.  Pt needed approx 2+ L of O2 to maintain sats in the lower 90's    If plan is discharge home, recommend the following: A little help with walking and/or transfers;A little help with bathing/dressing/bathroom;Assistance with cooking/housework;Assist for transportation;Help with stairs or ramp for entrance   Can travel by private vehicle        Equipment Recommendations  None recommended by PT    Recommendations for Other Services       Precautions / Restrictions Precautions Precautions: Fall Recall of Precautions/Restrictions: Intact     Mobility  Bed Mobility Overal bed mobility: Needs Assistance Bed Mobility: Supine to Sit     Supine to sit: Modified independent (Device/Increase time)          Transfers Overall transfer level: Needs assistance Equipment used: None Transfers: Sit to/from Stand Sit to Stand: Supervision           General transfer comment: used UE's appropriately    Ambulation/Gait Ambulation/Gait assistance: Contact guard assist, Min assist Gait Distance (Feet): 180 Feet (100 after sitting break) Assistive device: Straight cane Gait  Pattern/deviations: Step-through pattern   Gait velocity interpretation: <1.31 ft/sec, indicative of household ambulator   General Gait Details: mildly unsteady overall, but more so initially with soft stagger and drift left.  Slow cadence, short steps.  Great use of the cane.   Stairs Stairs: Yes Stairs assistance: Min assist Stair Management: Step to pattern, Forwards, With cane, No rails Number of Stairs: 4 General stair comments: safe with minimal HHA, home outside steps have to rail   Wheelchair Mobility     Tilt Bed    Modified Rankin (Stroke Patients Only)       Balance Overall balance assessment: Mild deficits observed, not formally tested                                          Communication Communication Communication: No apparent difficulties  Cognition Arousal: Alert Behavior During Therapy: Flat affect   PT - Cognitive impairments:  (pt had hallucinations last night o/w Ax4)                         Following commands: Intact      Cueing Cueing Techniques: Verbal cues  Exercises General Exercises - Lower Extremity Hip ABduction/ADduction: AROM, Both, 10 reps, Standing Hip Flexion/Marching: AROM, Both, 10 reps, Standing Heel Raises: AROM, 10 reps, Standing    General Comments General comments (skin integrity, edema, etc.): Sats decreasing slowly on RA.  Dropped into the mid 80's on RA.  Rose to 90% on 2L and 96% on 3L [name] during mobility.  HR in the 80's      Pertinent Vitals/Pain Pain Assessment Pain Assessment: No/denies pain    Home Living Family/patient expects to be discharged to:: Private residence Living Arrangements: Children Available Help at Discharge: Family;Available 24 hours/day Type of Home: House Home Access: Stairs to enter Entrance Stairs-Rails: None Entrance Stairs-Number of Steps: 3   Home Layout: One level        Prior Function            PT Goals (current goals can now be found in the  care plan section) Acute Rehab PT Goals Patient Stated Goal: Get well return home, walk more PT Goal Formulation: With patient Time For Goal Achievement: 07/04/24 Potential to Achieve Goals: Good Progress towards PT goals: Progressing toward goals    Frequency    Min 2X/week      PT Plan      Co-evaluation              AM-PAC PT 6 Clicks Mobility   Outcome Measure  Help needed turning from your back to your side while in a flat bed without using bedrails?: A Little Help needed moving from lying on your back to sitting on the side of a flat bed without using bedrails?: A Little Help needed moving to and from a bed to a chair (including a wheelchair)?: A Little Help needed standing up from a chair using your arms (e.g., wheelchair or bedside chair)?: A Little Help needed to walk in hospital room?: A Little Help needed climbing 3-5 steps with a railing? : A Little 6 Click Score: 18    End of Session Equipment Utilized During Treatment: Gait belt;Oxygen  Activity Tolerance: Patient tolerated treatment well Patient left: Other (comment) (with OT) Nurse Communication: Mobility status PT Visit Diagnosis: Unsteadiness on feet (R26.81);Other abnormalities of gait and mobility (R26.89);Muscle weakness (generalized) (M62.81);Difficulty in walking, not elsewhere classified (R26.2)     Time: 8959-8884 PT Time Calculation (min) (ACUTE ONLY): 35 min  Charges:    $Gait Training: 8-22 mins $Therapeutic Exercise: 8-22 mins PT General Charges $$ ACUTE PT VISIT: 1 Visit                     06/21/2024  Stacey HERO., PT Acute Rehabilitation Services [phone]  (office)   Stacey Sandoval 06/21/2024, 11:24 AM

## 2024-06-21 NOTE — Progress Notes (Addendum)
 Progress Note  Patient Name: Stacey Sandoval Date of Encounter: 06/21/2024 [facility] Cardiologist: None   Interval Summary    Sitting up in bed, no recent melena (no BM yet since scope). No chest pain.   Vital Signs Vitals:   06/21/24 0200 06/21/24 0400 06/21/24 0429 06/21/24 0809  BP:   (!) 114/55 95/66  Pulse: 76 71 75 96  Resp:   20 18  Temp:   97.6 F (36.4 C) 97.7 F (36.5 C)  TempSrc:   Oral Oral  SpO2: 97% 98% 99% 91%  Weight:   70 kg   Height:        Intake/Output Summary (Last 24 hours) at 06/21/2024 0843 Last data filed at 06/21/2024 0610 Gross per 24 hour  Intake 480 ml  Output 1800 ml  Net -1320 ml      06/21/2024    4:29 AM 06/20/2024    4:57 AM 06/19/2024    5:33 AM  Last 3 Weights  Weight (lbs) 154 lb 5.2 oz 155 lb 3.3 oz 153 lb 14.4 oz  Weight (kg) 70 kg 70.4 kg 69.809 kg      Telemetry/ECG  Sinus Rhythm with breakthrough episode of atrial tachycardia - Personally Reviewed  Physical Exam  GEN: No acute distress. [place] 3L Neck: No JVD Cardiac: RRR, + soft systolic murmur, no rubs, or gallops.  Respiratory: course rhonchi (improved from yesterday) GI: Soft, nontender, non-distended  MS: No edema  Assessment & Plan   73 y.o. female with a past medical history of paroxysmal atrial fibrillation, COPD, chronic HFpEF, current smoker x 40+ years, who was seen 06/13/2024 for the evaluation of a recent NSTEMI. Underwent cardiac cath at [facility] with focal lesion in the RCA needs CSI. Transferred to [facility] for intervention   NSTEMI -- Initially presented to [facility] with chest pain and found to have NSTEMI.  Underwent cardiac catheterization 7/3 with proximal RCA lesion of 95%, heavily calcified, mid RCA lesion 85% with 95% stenosis of sidebranch and acute margin.  Moderately calcified large RV marginal branch, mid RCA lesion 50% with proximal circumflex lesion of 70%.  Transfer to [facility] for further plans for intervention with CSI/arthrectomy of the RCA.   Initially planned for 7/7 but was significantly dyspneic and tachycardic on exam having difficulty lying flat.   -- Underwent cardiac catheterization 7/8 with successful OTC guided CSI orbital atherectomy with DES x2.  Recommendations initially for DAPT with aspirin /Plavix  for 1 month, given the need for Eliquis . Then plan to drop aspirin  after 30 days. With GI bleed, will plan to stop ASA once Eliquis  resumed and continue plavix . -- Continue aspirin , Plavix , atorvastatin    Acute GI bleed -- Developed episode of melena with large black tarry bowel movement evening of 7/8.  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 -- Continue Protonix  40 mg twice daily for 4 weeks then decrease to once daily for 4 weeks -- GI recs for resuming Eliquis  on 7/14 as long as no recurrent bleeding   Paroxysmal atrial fibrillation -- Currently maintaining sinus rhythm, but paroxysms of atrial tachycardia at times -- Continue bisoprolol  and diltiazem , BP remains soft making further titration difficult.  Would consider her a poor candidate for amiodarone  given significant underlying lung disease. -- as above, plan to resume Eliquis  7/14   Acute on Chronic diastolic HF -- Echo 7/2 with LVEF of 60 to 65%, no regional wall motion abnormality,  normal RV, moderately enlarged left atria, mild MR -- BNP 625>>481, chest x-ray 7/7 with small bilateral effusions -- repeat BNP yesterday 303, s/p IV lasix  1.8L UOP, weight down now at 154lbs -- continue farxiga , add spiro 12.5mg  daily -- discussed daily weights at home and likely PRN lasix  20mg  at discharge   Tobacco use COPD -- continues to require 2-3L, was on PRN O2 PTA -- continue Breztri , duoneb   Hypokalemia -- K 3.1, supp  PT following, needs to ambulate as she was living at home prior to admission  Medical Readiness Date: 06/22/2024    For questions or updates, please contact [facility] Please consult [email or web address] for contact info under       Signed, Manuelita Rummer, NP   Patient seen, examined. Available data reviewed. Agree with findings, assessment, and plan as outlined by Manuelita Rummer, NP. Please see my documentation in her DC Summary this same date.   Ozell Fell, M.D. 06/21/2024 10:25 AM

## 2024-06-21 NOTE — Progress Notes (Signed)
 Discharge paperwork completed.  Reviewed AVS, medications , home care plan and wound care instructions with patient.  Daughter on speaker phone during education and teaching.   No PIV in place.  Patient transferred with O2 Nasal canula.  Daughter has home O2 tank present during transport home.  TOC medications picked up by patient.    Patient and daughter given time to ask questions.

## 2024-06-21 NOTE — Discharge Summary (Addendum)
 Discharge Summary   Patient ID: Stacey Sandoval MRN: [id number]; DOB: Feb 04, 1951  Admit date: 06/13/2024 Discharge date: 06/21/2024  PCP:  [facility] Providers Cardiologist:  Evalene Lunger, MD     Discharge Diagnoses  Principal Problem:   NSTEMI (non-ST elevated myocardial infarction) [facility]) Active Problems:   Essential hypertension   Hyperlipidemia   Acute on chronic heart failure with preserved ejection fraction (HFpEF) (HCC)   Paroxysmal atrial fibrillation (HCC)   Oxygen  dependent   (HFpEF) heart failure with preserved ejection fraction (HCC)   Melena   ABLA (acute blood loss anemia)   AVM (arteriovenous malformation) of small bowel, acquired with hemorrhage   Diagnostic Studies/Procedures   Cath: 06/13/2024    Prox RCA lesion is 95% stenosed.  Heavily calcified focal lesion   Prox RCA to Mid RCA lesion is 85% stenosed with 90% stenosed side branch in Acute Mrg.  Moderately calcified involving large RV marginal branch.   Mid RCA lesion is 50% stenosed.   Prox Cx lesion is 70% stenosed.  (Focal) at this point would consider medical management pending completion of the RCA intervention   Mid LAD to Dist LAD lesion is 30% stenosed.   LV end diastolic pressure is normal. There is no aortic valve stenosis.   Dominance: Right body  RECOMMENDATIONS   Plan will be to load with antiplatelet agent (likely Brilinta ), restart IV heparin  2 hours post TR band removal.   Will arrange transfer to [facility] with plans for atherectomy based PCI to the RCA on Monday, 06/17/2024   Recommend dual antiplatelet therapy with Aspirin  81mg  daily and Ticagrelor  90mg  twice daily long-term (beyond 12 months) because of Will likely require long stent in the RCA.  Alm Clay, MD, MS  Cath: 06/18/2024    Lesion Segment #1 prox RCA to Mid RCA lesion is 85% stenosed with 70% stenosed side branch in Acute Mrg. Mid RCA lesion is 50% stenosed.    Orbital atherectomy followed by OCT performed to ensure adequate stent coverage and expansion.   A drug-eluting stent was successfully placed covering the significant 85% lesion and the more distal 50% lesion, using a STENT SYNERGY XD 3.0X32.  Stent was postdilated to 3.3 mm. Post intervention, there is a 0% residual stenosis.  TIMI-3 flow maintained. Post intervention, the side branch was reduced to 60% residual stenosis.   Lesion #2: Prox RCA lesion is 95% stenosed.   -------------------------------------------------------------------------   Orbital atherectomy followed by OCT performed to ensure adequate stent coverage and expansion.   A drug-eluting stent was successfully placed using a STENT SYNERGY XD 4.0X28. Stent was postdilated in tapered fashion from 4.6 to 4.1 mm; Post intervention, there is a 0% residual stenosis. TIMI-3 flow maintained   -------------------------------------------------------------------------   Diagnostic               Dominance: Right                                                  Intervention             Successful OCT guided CSI orbital atherectomy based DES PCI of the ostial to distal RCA treated from significant calcified lesions and placing 2 overlapping Synergy XD DES stents: 3.0 mm x 32 mm postdilated to 3.3 mm, with a proximal overlapping 4.0 mm  x 28 mm stent deployed to 4.1 mm and postdilated proximally to 4.5 mm based on OCT.  Transient hypotension during intervention requiring Levophed  that was easily weaned off upon completion of the procedure.  Baseline hypoxia and hypercapnia with oxygen  saturations in the high 80s with blood gas PCO2 in the 70s with pH roughly 7.3.    RECOMMENDATIONS   In the absence of any other complications or medical issues, we expect the patient to be ready for discharge from an interventional cardiology perspective on 06/19/2024.   Recommend to resume Apixaban , at currently prescribed dose and frequency on 06/19/2024.   Recommend  concurrent antiplatelet therapy of Aspirin  81 mg for 1 month and Clopidogrel  75mg  daily for 12 months .   Will restart IV heparin  overnight tonight and then initiate DOAC on 06/19/2024   Alm Clay, MD   _____________   History of Present Illness   Stacey Sandoval is a 73 y.o. female with a past medical history of paroxysmal atrial fibrillation, COPD, chronic HFpEF, current smoker x 40+ years, who was seen 06/13/2024 for the evaluation of a recent NSTEMI.   Stacey Sandoval presented to the [facility] emergency department on 06/12/2024 with complaints of chest pain.  Symptoms persisted prompting call to EMS.  She denied prior episodes.  Stated that she has been compliant with her medications as prescribed.  Unfortunately she continues to smoke.   In the emergency department EKG revealed sinus rhythm.  High-sensitivity troponins trended 25 and 479.  She was started on aspirin  and heparin  drip per ACS protocol.  Chest CT showed no PE but three-vessel coronary calcifications were noted.   She was scheduled for an echocardiogram and remained n.p.o. for left heart catheterization.  Echocardiogram revealed an LVEF of 60 to 65%, no RWMA, mild LVH, G2 DD, right ventricular systolic function is normal with normal cavity size is, mild to moderately dilated left atrium, mild mitral regurgitation, and aortic valve sclerosis without evidence of stenosis.  Left heart catheterization revealed proximal RCA lesion 95% stenosis, heavily calcified focal lesion, proximal RCA to mid RCA lesion 85% stenosis with 95% stenosis sidebranch in acute margin.  Moderately calcified involving a large RV marginal branch, mid RCA lesion 50% stenosis, proximal circumflex lesion 70% stenosis focal at this point will consider medical management pending completion of RCA intervention, mid LAD and distal LAD lesion 30% stenosis, and LV end-diastolic pressure was normal.  There was no aortic valve stenosis.  Recommendation was to load with antiplatelets  therapy restart IV heparin  2 hours post TR band removal.  It was recommended patient be transferred to [facility] with plans for arthrectomy based PCI to the RCA on Monday of 06/17/2024.  Continue with dual antiplatelet therapy with aspirin  81 mg daily and Brilinta  90 mg twice daily beyond 12 months.   Hospital Course   Consultants: GI   NSTEMI -- Initially presented to [facility] with chest pain and found to have NSTEMI.  Underwent cardiac catheterization 7/3 with proximal RCA lesion of 95%, heavily calcified, mid RCA lesion 85% with 95% stenosis of sidebranch and acute margin.  Moderately calcified large RV marginal branch, mid RCA lesion 50% with proximal circumflex lesion of 70%.  Transfer to [facility] for further plans for intervention with CSI/arthrectomy of the RCA.  Initially planned for 7/7 but was significantly dyspneic and tachycardic on exam having difficulty lying flat.   -- Underwent cardiac catheterization 7/8 with successful OTC guided CSI orbital atherectomy with DES x2.  Recommendations initially for  DAPT with aspirin /Plavix  for 1 month, given the need for Eliquis . Then plan to drop aspirin  after 30 days. With GI bleed, will plan to stop ASA once Eliquis  resumed and continue plavix . -- Continue aspirin , Plavix , atorvastatin    Acute GI bleed -- Developed episode of melena with large black tarry bowel movement evening of 7/8.  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 -- Continue Protonix  40 mg twice daily for 4 weeks then decrease to once daily for 4 weeks -- GI recs for resuming Eliquis  on 7/14   Paroxysmal atrial fibrillation -- Currently maintaining sinus rhythm, but paroxysms of atrial tachycardia at times -- Continue bisoprolol  and diltiazem , BP remains soft making further titration difficult.  Would consider her a poor candidate for amiodarone  given significant underlying lung disease. -- as  above, plan to resume Eliquis  7/14   Acute on Chronic diastolic HF -- Echo 7/2 with LVEF of 60 to 65%, no regional wall motion abnormality, normal RV, moderately enlarged left atria, mild MR -- BNP 625>>481, chest x-ray 7/7 with small bilateral effusions -- repeat BNP 7/10 303, s/p IV lasix  1.8L UOP, weight down now at 154lbs -- continue farxiga , added spiro 12.5mg  daily -- discussed daily weights at home and likely PRN lasix  20mg  at discharge   Tobacco use COPD -- continues to require 2-3L, was on PRN O2 PTA -- continue Breztri , duoneb -- needs to follow up with pulmonary outpatient    Hypokalemia -- K 3.1, supp -- as above, add spiro  Patient seen by myself and Dr. Wonda and deemed stable for discharge home. Follow up arranged in the office. Medications sent to the [facility] pharmacy.   Did the patient have an acute coronary syndrome (MI, NSTEMI, STEMI, etc) this admission?:  Yes                               AHA/ACC ACS Clinical Performance & Quality Measures: Aspirin  prescribed? - No, GIB ADP Receptor Inhibitor (Plavix /Clopidogrel , Brilinta /Ticagrelor  or Effient/Prasugrel) prescribed (includes medically managed patients)? - Yes Beta Blocker prescribed? - Yes High Intensity Statin (Lipitor 40-80mg  or Crestor 20-40mg ) prescribed? - Yes EF assessed during THIS hospitalization? - Yes For EF <40%, was ACEI/ARB prescribed? - Not Applicable (EF >/= 40%) For EF <40%, Aldosterone Antagonist (Spironolactone  or Eplerenone) prescribed? - Not Applicable (EF >/= 40%) Cardiac Rehab Phase II ordered (including medically managed patients)? - Yes       The patient will be scheduled for a TOC follow up appointment in 10-14 days.  A message has been sent to the [facility] and Scheduling Pool at the office where the patient should be seen for follow up.  _____________  Discharge Vitals Blood pressure (!) 103/48, pulse 78, temperature 97.7 F (36.5 C), temperature source Oral, resp. rate 14, height 5'  1 (1.549 m), weight 70 kg, SpO2 96%.  Filed Weights   06/19/24 0533 06/20/24 0457 06/21/24 0429  Weight: 69.8 kg 70.4 kg 70 kg    Labs & Radiologic Studies  CBC Recent Labs    06/20/24 0425 06/21/24 0828  WBC 8.0 5.8  HGB 9.7* 9.4*  HCT 33.2* 31.9*  MCV 99.7 97.3  PLT 270 293   Basic Metabolic Panel Recent Labs    92/90/74 0417 06/20/24 0425 06/21/24 0425  NA 138 142 138  K 3.7 4.1 3.1*  CL 98 98 93*  CO2  34* 36* 34*  GLUCOSE 96 103* 88  BUN 12 8 9   CREATININE 0.60 0.71 0.69  CALCIUM  7.2* 7.5* 7.3*  MG 1.8  --   --    Liver Function Tests No results for input(s): AST, ALT, ALKPHOS, BILITOT, PROT, ALBUMIN  in the last 72 hours. No results for input(s): LIPASE, AMYLASE in the last 72 hours. High Sensitivity Troponin:   Recent Labs  Lab 06/12/24 0627 06/12/24 0828 06/13/24 0458  TROPONINIHS 25* 479* 707*    No results for input(s): TRNPT in the last 720 hours.  BNP Invalid input(s): POCBNP No results for input(s): PROBNP in the last 72 hours.  Recent Labs    06/20/24 1050  BNP 303.6*    D-Dimer No results for input(s): DDIMER in the last 72 hours. Hemoglobin A1C No results for input(s): HGBA1C in the last 72 hours. Fasting Lipid Panel No results for input(s): CHOL, HDL, LDLCALC, TRIG, CHOLHDL, LDLDIRECT in the last 72 hours. Lipoprotein (a)  Date/Time Value Ref Range Status  06/14/2024 03:57 AM <8.4 <75.0 nmol/L Final    Comment:    (NOTE) **Results verified by repeat testing** This test was developed and its performance characteristics determined by [facility]. It has not been cleared or approved by the Food and Drug Administration. Note:  Values greater than or equal to 75.0 nmol/L may       indicate an independent risk factor for CHD,       but must be evaluated with caution when applied       to non-Caucasian populations due to the       influence of genetic factors on Lp(a) across        ethnicities. Performed At: [facility] [address] [place], [place] [id number] Jennette Shorter MD Ey:[phone]     Thyroid  Function Tests No results for input(s): TSH, T4TOTAL, T3FREE, THYROIDAB in the last 72 hours.  Invalid input(s): FREET3 _____________  DG Chest Port 1 View Result Date: 06/20/2024 CLINICAL DATA:  Acute respiratory distress. EXAM: PORTABLE CHEST 1 VIEW COMPARISON:  Chest CT 06/12/2024 and chest x-ray 06/18/1999. FINDINGS: The cardiac silhouette, mediastinal and contours are within normal limits and stable. Stable tortuosity and calcification of the thoracic aorta. Bilateral pleural effusions, slightly enlarged since the prior chest x-ray. The vascular congestion and mild interstitial edema has largely resolved. Bibasilar scarring and atelectasis but no definite infiltrates. No pneumothorax. IMPRESSION: 1. Bilateral pleural effusions, slightly enlarged since the prior chest x-ray. 2. Resolution of vascular congestion and interstitial edema. 3. Bibasilar scarring and atelectasis. Electronically Signed   By: MYRTIS Stammer M.D.   On: 06/20/2024 22:55   CARDIAC CATHETERIZATION Result Date: 06/18/2024 Images from the original result were not included.   Lesion Segment #1 prox RCA to Mid RCA lesion is 85% stenosed with 70% stenosed side branch in Acute Mrg. Mid RCA lesion is 50% stenosed.   Orbital atherectomy followed by OCT performed to ensure adequate stent coverage and expansion.   A drug-eluting stent was successfully placed covering the significant 85% lesion and the more distal 50% lesion, using a STENT SYNERGY XD 3.0X32.  Stent was postdilated to 3.3 mm. Post intervention, there is a 0% residual stenosis.  TIMI-3 flow maintained. Post intervention, the side branch was reduced to 60% residual stenosis.   Lesion #2: Prox RCA lesion is 95% stenosed.   -------------------------------------------------------------------------   Orbital atherectomy followed by OCT  performed to ensure adequate stent coverage and expansion.   A drug-eluting stent was successfully placed using  a STENT SYNERGY XD 4.0X28. Stent was postdilated in tapered fashion from 4.6 to 4.1 mm; Post intervention, there is a 0% residual stenosis. TIMI-3 flow maintained   ------------------------------------------------------------------------- Diagnostic  Dominance: Right     Intervention  Successful OCT guided CSI orbital atherectomy based DES PCI of the ostial to distal RCA treated from significant calcified lesions and placing 2 overlapping Synergy XD DES stents: 3.0 mm x 32 mm postdilated to 3.3 mm, with a proximal overlapping 4.0 mm x 28 mm stent deployed to 4.1 mm and postdilated proximally to 4.5 mm based on OCT. Transient hypotension during intervention requiring Levophed  that was easily weaned off upon completion of the procedure. Baseline hypoxia and hypercapnia with oxygen  saturations in the high 80s with blood gas PCO2 in the 70s with pH roughly 7.3. RECOMMENDATIONS   In the absence of any other complications or medical issues, we expect the patient to be ready for discharge from an interventional cardiology perspective on 06/19/2024.   Recommend to resume Apixaban , at currently prescribed dose and frequency on 06/19/2024.   Recommend concurrent antiplatelet therapy of Aspirin  81 mg for 1 month and Clopidogrel  75mg  daily for 12 months .   Will restart IV heparin  overnight tonight and then initiate DOAC on 06/19/2024 Alm Clay, MD     DG CHEST PORT 1 VIEW Result Date: 06/17/2024 CLINICAL DATA:  Dyspnea. EXAM: PORTABLE CHEST 1 VIEW COMPARISON:  06/15/2024. FINDINGS: Stable cardiomediastinal contours. Aortic atherosclerosis. Persistent vascular congestion with similar bilateral diffuse interstitial prominence, suggestive of pulmonary interstitial edema. Similar small bilateral pleural effusions. No pneumothorax. Visualized osseous structures are unchanged. IMPRESSION: 1. Persistent vascular congestion  with similar bilateral diffuse interstitial prominence, suggestive of pulmonary interstitial edema. 2. Similar small bilateral pleural effusions. Electronically Signed   By: Harrietta Sherry M.D.   On: 06/17/2024 09:00   DG CHEST PORT 1 VIEW Result Date: 06/15/2024 CLINICAL DATA:  Dyspnea EXAM: PORTABLE CHEST 1 VIEW COMPARISON:  06/12/2024, 03/07/2022 FINDINGS: Cardiomegaly with vascular congestionand mild diffuse interstitial opacity suggestive of mild edema. Suspicion of small pleural effusions. Aortic atherosclerosis. No pneumothorax IMPRESSION: Cardiomegaly with vascular congestion and suspicion of mild interstitial edema. Probable small pleural effusions. Electronically Signed   By: Luke Bun M.D.   On: 06/15/2024 16:04   CARDIAC CATHETERIZATION Result Date: 06/13/2024 Images from the original result were not included.   Prox RCA lesion is 95% stenosed.  Heavily calcified focal lesion   Prox RCA to Mid RCA lesion is 85% stenosed with 90% stenosed side branch in Acute Mrg.  Moderately calcified involving large RV marginal branch.   Mid RCA lesion is 50% stenosed.   Prox Cx lesion is 70% stenosed.  (Focal) at this point would consider medical management pending completion of the RCA intervention   Mid LAD to Dist LAD lesion is 30% stenosed.   LV end diastolic pressure is normal. There is no aortic valve stenosis. Dominance: Right body RECOMMENDATIONS   Plan will be to load with antiplatelet agent (likely Brilinta ), restart IV heparin  2 hours post TR band removal.   Will arrange transfer to [facility] with plans for atherectomy based PCI to the RCA on Monday, 06/17/2024   Recommend dual antiplatelet therapy with Aspirin  81mg  daily and Ticagrelor  90mg  twice daily long-term (beyond 12 months) because of Will likely require long stent in the RCA. Alm Clay, MD, MS Alm Clay, M.D., M.S. Interventional Cardiologist [facility] Pager # [phone]   ECHOCARDIOGRAM COMPLETE Result  Date: 06/12/2024    ECHOCARDIOGRAM  REPORT   Patient Name:   Stacey Sandoval Date of Exam: 06/12/2024 Medical Rec #:  [id number]       Height:       60.0 in Accession #:    [id number]      Weight:       161.3 lb Date of Birth:  11-11-1951       BSA:          1.704 m Patient Age:    73 years        BP:           103/53 mmHg Patient Gender: F               HR:           66 bpm. Exam Location:  [facility] Procedure: 2D Echo, Cardiac Doppler and Color Doppler (Both Spectral and Color            Flow Doppler were utilized during procedure). Indications:     Chest pain R07.9  History:         Patient has prior history of Echocardiogram examinations, most                  recent 03/09/2022. CHF, COPD; Arrythmias:Atrial Fibrillation.                  Tobacco use.  Sonographer:     Christopher Furnace Referring Phys:  [id number] REDELL CAVE Diagnosing Phys: REDELL CAVE MD IMPRESSIONS  1. Left ventricular ejection fraction, by estimation, is 60 to 65%. The left ventricle has normal function. The left ventricle has no regional wall motion abnormalities. There is mild left ventricular hypertrophy of the basal-septal segment. Left ventricular diastolic parameters are consistent with Grade II diastolic dysfunction (pseudonormalization).  2. Right ventricular systolic function is normal. The right ventricular size is normal.  3. Left atrial size was mild to moderately dilated.  4. The mitral valve is normal in structure. Mild mitral valve regurgitation.  5. The aortic valve was not well visualized. Aortic valve regurgitation is not visualized. Aortic valve sclerosis is present, with no evidence of aortic valve stenosis. Aortic valve mean gradient measures 7.0 mmHg. FINDINGS  Left Ventricle: Left ventricular ejection fraction, by estimation, is 60 to 65%. The left ventricle has normal function. The left ventricle has no regional wall motion abnormalities. The left ventricular internal cavity size was normal in size. There is  mild left  ventricular hypertrophy of the basal-septal segment. Left ventricular diastolic parameters are consistent with Grade II diastolic dysfunction (pseudonormalization). Right Ventricle: The right ventricular size is normal. No increase in right ventricular wall thickness. Right ventricular systolic function is normal. Left Atrium: Left atrial size was mild to moderately dilated. Right Atrium: Right atrial size was normal in size. Pericardium: There is no evidence of pericardial effusion. Mitral Valve: The mitral valve is normal in structure. Mild mitral valve regurgitation. MV peak gradient, 14.0 mmHg. The mean mitral valve gradient is 6.0 mmHg. Tricuspid Valve: The tricuspid valve is normal in structure. Tricuspid valve regurgitation is not demonstrated. Aortic Valve: The aortic valve was not well visualized. Aortic valve regurgitation is not visualized. Aortic valve sclerosis is present, with no evidence of aortic valve stenosis. Aortic valve mean gradient measures 7.0 mmHg. Aortic valve peak gradient measures 12.2 mmHg. Aortic valve area, by VTI measures 2.72 cm. Pulmonic Valve: The pulmonic valve was not well visualized. Pulmonic valve regurgitation is mild. Aorta: The aortic root is normal in size and structure.  Venous: The inferior vena cava was not well visualized. IAS/Shunts: No atrial level shunt detected by color flow Doppler.  LEFT VENTRICLE PLAX 2D LVIDd:         3.90 cm   Diastology LVIDs:         2.50 cm   LV e' medial:    6.42 cm/s LV PW:         0.90 cm   LV E/e' medial:  24.6 LV IVS:        1.20 cm   LV e' lateral:   8.70 cm/s LVOT diam:     2.00 cm   LV E/e' lateral: 18.2 LV SV:         116 LV SV Index:   68 LVOT Area:     3.14 cm  RIGHT VENTRICLE RV Basal diam:  2.60 cm RV Mid diam:    2.10 cm LEFT ATRIUM             Index        RIGHT ATRIUM           Index LA diam:        4.60 cm 2.70 cm/m   RA Area:     11.50 cm LA Vol (A2C):   69.9 ml 41.03 ml/m  RA Volume:   19.40 ml  11.39 ml/m LA Vol  (A4C):   63.1 ml 37.04 ml/m LA Biplane Vol: 68.2 ml 40.03 ml/m  AORTIC VALVE AV Area (Vmax):    2.60 cm AV Area (Vmean):   2.94 cm AV Area (VTI):     2.72 cm AV Vmax:           175.00 cm/s AV Vmean:          125.000 cm/s AV VTI:            0.427 m AV Peak Grad:      12.2 mmHg AV Mean Grad:      7.0 mmHg LVOT Vmax:         145.00 cm/s LVOT Vmean:        117.000 cm/s LVOT VTI:          0.370 m LVOT/AV VTI ratio: 0.87  AORTA Ao Root diam: 2.80 cm MITRAL VALVE                TRICUSPID VALVE MV Area (PHT): 1.87 cm     TR Peak grad:   24.0 mmHg MV Area VTI:   2.02 cm     TR Vmax:        245.00 cm/s MV Peak grad:  14.0 mmHg MV Mean grad:  6.0 mmHg     SHUNTS MV Vmax:       1.87 m/s     Systemic VTI:  0.37 m MV Vmean:      114.0 cm/s   Systemic Diam: 2.00 cm MV Decel Time: 406 msec MV E velocity: 158.00 cm/s MV A velocity: 123.00 cm/s MV E/A ratio:  1.28 Redell Cave MD Electronically signed by Redell Cave MD Signature Date/Time: 06/12/2024/2:57:05 PM    Final    CT Angio Chest PE W and/or Wo Contrast Result Date: 06/12/2024 CLINICAL DATA:  Central chest pain, severe indigestion. EXAM: CT ANGIOGRAPHY CHEST WITH CONTRAST TECHNIQUE: Multidetector CT imaging of the chest was performed using the standard protocol during bolus administration of intravenous contrast. Multiplanar CT image reconstructions and MIPs were obtained to evaluate the vascular anatomy. RADIATION DOSE REDUCTION: This exam was performed according to the departmental  dose-optimization program which includes automated exposure control, adjustment of the mA and/or kV according to patient size and/or use of iterative reconstruction technique. CONTRAST:  75mL OMNIPAQUE  IOHEXOL  350 MG/ML SOLN COMPARISON:  05/16/2023. FINDINGS: Cardiovascular: Negative for pulmonary embolus. Atherosclerotic calcification of the aorta, aortic valve and coronary arteries. Enlarged pulmonic trunk and heart. Ascending aorta measures 3.8 cm (coronal image 43). No  pericardial effusion. Mediastinum/Nodes: Thyroidectomy. No pathologically enlarged mediastinal, hilar or axillary lymph nodes. Esophagus is grossly unremarkable. Lungs/Pleura: Image quality is degraded by expiratory phase imaging. Minimal dependent atelectasis. Scarring in the medial apex of the left upper lobe. Additional scarring in the lingula. No pleural fluid. Airway is unremarkable. Upper Abdomen: Cholecystectomy. Small hiatal hernia. Visualized portions of the liver, adrenal glands, kidneys, spleen, pancreas, stomach and bowel are otherwise grossly unremarkable. No upper abdominal adenopathy. Musculoskeletal: Degenerative changes in the spine. Review of the MIP images confirms the above findings. IMPRESSION: 1. Negative for pulmonary embolus. 2. Aortic atherosclerosis (ICD10-I70.0). Coronary artery calcification. 3. Enlarged pulmonic trunk, indicative of pulmonary arterial hypertension. Electronically Signed   By: Newell Eke M.D.   On: 06/12/2024 10:32   DG Chest Port 1 View Result Date: 06/12/2024 CLINICAL DATA:  Chest pain EXAM: PORTABLE CHEST 1 VIEW COMPARISON:  04/01/2022 FINDINGS: The cardio pericardial silhouette is enlarged. Left base collapse/consolidation noted peripherally with probable small left effusion. Right lung clear. Interstitial markings are diffusely coarsened with chronic features. No acute bony abnormality. Telemetry leads overlie the chest. IMPRESSION: Left base collapse/consolidation with probable small left effusion. Electronically Signed   By: Camellia Candle M.D.   On: 06/12/2024 07:07    Disposition Pt is being discharged home today in good condition.  Follow-up Plans & Appointments  Follow-up Information     [facility]. Go in 4 day(s).   Specialty: Cardiology Why: Hospital follow up 06/28/2024 @ 3 pm  PLEASE bring a current medication list to appointment Contact information: [address] [place]  [place]  [address] [phone]               Discharge Instructions     AMB Referral to Cardiac Rehabilitation - Phase II   Complete by: As directed    Diagnosis:  NSTEMI Heart Failure (see criteria below if ordering Phase II) Coronary Stents     Heart Failure Type: Chronic Diastolic   After initial evaluation and assessments completed: Virtual Based Care may be provided alone or in conjunction with Phase 2 Cardiac Rehab based on patient barriers.: Yes   Intensive Cardiac Rehabilitation (ICR) [facility] location only OR Traditional Cardiac Rehabilitation (TCR) *If criteria for ICR are not met will enroll in TCR [facility] only): Yes   Diet - low sodium heart healthy   Complete by: As directed    Discharge instructions   Complete by: As directed    Radial Site Care Refer to this sheet in the next few weeks. These instructions provide you with information on caring for yourself after your procedure. Your caregiver may also give you more specific instructions. Your treatment has been planned according to current medical practices, but problems sometimes occur. Call your caregiver if you have any problems or questions after your procedure. HOME CARE INSTRUCTIONS You may shower the day after the procedure. Remove the bandage (dressing) and gently wash the site with plain soap and water. Gently pat the site dry.  Do not apply powder or lotion to the site.  Do not submerge the affected site in  water for 3 to 5 days.  Inspect the site at least twice daily.  Do not flex or bend the affected arm for 24 hours.  No lifting over 5 pounds (2.3 kg) for 5 days after your procedure.  Do not drive home if you are discharged the same day of the procedure. Have someone else drive you.  You may drive 24 hours after the procedure unless otherwise instructed by your caregiver.  What to expect: Any bruising will usually fade within 1 to 2 weeks.  Blood that collects in the tissue (hematoma) may be painful to  the touch. It should usually decrease in size and tenderness within 1 to 2 weeks.  SEEK IMMEDIATE MEDICAL CARE IF: You have unusual pain at the radial site.  You have redness, warmth, swelling, or pain at the radial site.  You have drainage (other than a small amount of blood on the dressing).  You have chills.  You have a fever or persistent symptoms for more than 72 hours.  You have a fever and your symptoms suddenly get worse.  Your arm becomes pale, cool, tingly, or numb.  You have heavy bleeding from the site. Hold pressure on the site.   PLEASE DO NOT MISS ANY DOSES OF YOUR PLAVIX !!!!! Also keep a log of you blood pressures and bring back to your follow up appt. Please call the office with any questions.   Patients taking blood thinners should generally stay away from medicines like ibuprofen, Advil, Motrin, naproxen, and Aleve due to risk of stomach bleeding. You may take Tylenol  as directed or talk to your primary doctor about alternatives.   PLEASE ENSURE THAT YOU DO NOT RUN OUT OF YOUR PLAVIX . This medication is very important to remain on for at least one year. IF you have issues obtaining this medication due to cost please CALL the office 3-5 business days prior to running out in order to prevent missing doses of this medication.   Face-to-face encounter (required for Medicare/Medicaid patients)   Complete by: As directed    I Manuelita Rummer certify that this patient is under my care and that I, or a nurse practitioner or physician's assistant working with me, had a face-to-face encounter that meets the physician face-to-face encounter requirements with this patient on 06/21/2024. The encounter with the patient was in whole, or in part for the following medical condition(s) which is the primary reason for home health care (List medical condition): CAD, COPD, HF and deconditioned   The encounter with the patient was in whole, or in part, for the following medical condition, which is  the primary reason for home health care: CAD, COPD, deconditioned   I certify that, based on my findings, the following services are medically necessary home health services: Physical therapy   Reason for Medically Necessary Home Health Services: Therapy- Investment banker, operational, Patent examiner   My clinical findings support the need for the above services: Shortness of breath with activity   Further, I certify that my clinical findings support that this patient is homebound due to: Ambulates short distances less than 300 feet   Home Health   Complete by: As directed    To provide the following care/treatments: PT   Increase activity slowly   Complete by: As directed        Discharge Medications Allergies as of 06/21/2024       Reactions   Dextromethorphan -guaifenesin  Shortness Of Breath, Anxiety, Other (See Comments)   Globus pharyngeus  Medication List     PAUSE taking these medications    apixaban  5 MG Tabs tablet Wait to take this until: June 24, 2024 Morning Commonly known as: Eliquis  Take 1 tablet (5 mg total) by mouth 2 (two) times daily.       STOP taking these medications    aspirin  81 MG chewable tablet   Biotin 10 MG Caps   famotidine  40 MG tablet Commonly known as: PEPCID    potassium chloride  SA 20 MEQ tablet Commonly known as: KLOR-CON  M       TAKE these medications    acetaminophen  650 MG CR tablet Commonly known as: TYLENOL  Take 1,300 mg by mouth 2 (two) times daily.   albuterol  (2.5 MG/3ML) 0.083% nebulizer solution Commonly known as: PROVENTIL  Take 3 mLs (2.5 mg total) by nebulization every 6 (six) hours as needed for wheezing or shortness of breath.   atorvastatin  40 MG tablet Commonly known as: LIPITOR Take 1 tablet (40 mg total) by mouth daily.   bisoprolol  5 MG tablet Commonly known as: ZEBETA  TAKE 1 TABLET BY MOUTH EVERY DAY What changed: when to take this   Breztri  Aerosphere 160-9-4.8 MCG/ACT Aero  inhaler Generic drug: budesonide -glycopyrrolate -formoterol  Inhale 2 puffs into the lungs in the morning and at bedtime.   calcitRIOL 0.25 MCG capsule Commonly known as: ROCALTROL Take 0.25 mcg by mouth 2 (two) times daily.   clopidogrel  75 MG tablet Commonly known as: PLAVIX  Take 1 tablet (75 mg total) by mouth daily. Start taking on: June 22, 2024   dapagliflozin  propanediol 10 MG Tabs tablet Commonly known as: FARXIGA  Take 1 tablet (10 mg total) by mouth daily. Start taking on: June 22, 2024   diltiazem  180 MG 24 hr capsule Commonly known as: CARDIZEM  CD TAKE 1 CAPSULE(180 MG) BY MOUTH DAILY   diltiazem  30 MG tablet Commonly known as: Cardizem  Take 1 tablet (30 mg total) by mouth as needed (atrial fibrillation).   furosemide  20 MG tablet Commonly known as: Lasix  Take 1 tablet (20 mg total) by mouth daily as needed. What changed:  medication strength how much to take when to take this reasons to take this   gabapentin  300 MG capsule Commonly known as: NEURONTIN  Take 300 mg every am & 600 in the pm   levothyroxine 100 MCG tablet Commonly known as: SYNTHROID Take 100 mcg by mouth daily before breakfast.   mirtazapine  45 MG tablet Commonly known as: REMERON  Take 45 mg by mouth at bedtime.   nitroGLYCERIN  0.4 MG SL tablet Commonly known as: NITROSTAT  Place 1 tablet (0.4 mg total) under the tongue every 5 (five) minutes x 3 doses as needed for chest pain.   OXYGEN  Inhale into the lungs as needed. 2-3 Liters   pantoprazole  40 MG tablet Commonly known as: PROTONIX  Take 1 tablet (40 mg total) by mouth 2 (two) times daily before a meal for 30 days, THEN 1 tablet (40 mg total) daily. Start taking on: June 21, 2024   spironolactone  25 MG tablet Commonly known as: ALDACTONE  Take 0.5 tablets (12.5 mg total) by mouth daily. Start taking on: June 22, 2024   triamcinolone ointment 0.5 % Commonly known as: KENALOG Apply 1 Application topically 2 (two) times daily.          Outstanding Labs/Studies  CBC/BMET at follow up   Duration of Discharge Encounter: APP Time: 27 minutes   Signed, Manuelita Rummer, NP  Patient seen, examined. Available data reviewed. Agree with findings, assessment, and plan as outlined by Manuelita  Henry, NP.  The patient is independently interviewed and examined this morning.  Her daughter is present at the bedside.  On my examination, she is alert, oriented, in no distress.  HEENT is normal, JVP is normal, lungs have improved air movement but bilateral rhonchi are noted.  No inspiratory crackles present today.  Heart is regular rate and rhythm with a 2/6 systolic murmur at the left lower sternal border.  Abdomen is soft and nontender.  Skin is warm and dry with no rash.  There is no lower extremity edema.  The patient's telemetry demonstrates sinus rhythm with occasional supraventricular runs but no long episodes of atrial fibrillation are noted in the last 24 hours.  The patient appears medically stable today.  She remains on O2 per nasal cannula but has home oxygen  prescribed as needed at 2 to 3 L/min with her chronic COPD.  Her hospital problems are:  Non-STEMI: Patient initially presented with non-STEMI and was found to have severely calcified RCA stenosis.  She underwent complex orbital atherectomy and stenting of the right coronary artery without complication initially.  She has done well through the course of her hospitalization with no recurrent ischemic events.  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  She has stabilized since that time with stable hemoglobin, plans to resume apixaban  7/14 in concordance with GI recommendations, and continuation of Protonix  twice daily for 4 weeks then once daily thereafter.  Paroxysmal atrial fibrillation: Maintaining sinus rhythm on bisoprolol   and diltiazem .  She does have some paroxysms of atrial fibrillation but overall control is reasonable.  Poor candidate for amiodarone  with her lung disease.  Apixaban  to be resumed 7/14.  She will be discharged on bisoprolol  5 mg daily and diltiazem  180 mg daily.  She can continue to use short acting diltiazem  30 mg as needed for rapid heart rate.  Blood pressure will not allow for increase in her the beta-blocker or calcium  channel blocker at this point.  Acute on chronic HFpEF: LVEF 60 to 65%.  Patient appears euvolemic on exam.  She has received multiple doses of IV Lasix  here in the hospital.  We will send her home on as needed furosemide  due to concerns about hypotension.  She is on Farxiga  and spironolactone .  Will continue current therapy.  She understands to follow daily weights and use a sliding scale for as needed use of furosemide .  She will require close outpatient follow-up.  Other problems as outlined above.  I think the patient is medically stable for discharge today.  I had a lengthy discussion with the patient and her daughter and she has appropriate support at home.  She has oxygen  and a walker to be used as needed.  MD time spent conducting today's discharge is 35 minutes.  This includes extensive review of her hospital records, review of medications and discussion about management strategies once she is discharged.  This also includes review of consultant notes, cardiac catheterization data, echo data, and lab work.  We discussed plans for outpatient follow-up as well.  Regarding the patient's discharge medications, she should go home on clopidogrel  75 mg daily without aspirin .  She will start apixaban  5 mg twice daily on July 14.  Otherwise, she will continue on her hospital medications as prescribed with the addition of as needed short acting diltiazem  30 mg.  Ozell Fell, M.D. 06/21/2024  12:09 PM

## 2024-06-21 NOTE — Progress Notes (Signed)
 Heart Failure Nurse Navigator Progress Note  PCP: Center, Dedicated Production designer, theatre/television/film: None Admission Diagnosis: NSTEMI Admitted from: Home via EMS  Presentation:   Stacey Sandoval presented with central chest pain that woke her from her sleep. Patient O2 sats 88 %, placed on 2 L [place]. Had a cardiac cath at [facility] on 7/8 with successful OTC guided CSI orbital atherectomy with DES x2.   Patient and daughter were educated on the sign and symptoms of heart failure, daily weights, when to call her doctor or go to the ED. Diet/ fluid restrictions, taking all her medications as prescribed and attending all medical appointments. Patient and her daughter verbalized their understanding of education. A HF TOC appointment was scheduled for 06/27/2024 @ 3 pm.   ECHO/ LVEF: 60-65%  Clinical Course:  Past Medical History:  Diagnosis Date   Atrial fibrillation (HCC)    CHF (congestive heart failure) (HCC)    Complication of anesthesia    VERY slow to wake after hysterectomy   COPD (chronic obstructive pulmonary disease) (HCC)    Enlarged thyroid     has been referred to [facility]   Hyperlipidemia    Hypertension    Smokers' cough (HCC)    Tobacco use    Wears dentures    Full upper and lower     Social History   Socioeconomic History   Marital status: Widowed    Spouse name: Not on file   Number of children: Not on file   Years of education: Not on file   Highest education level: Not on file  Occupational History   Not on file  Tobacco Use   Smoking status: Every Day    Current packs/day: 0.25    Average packs/day: 0.3 packs/day for 40.0 years (10.0 ttl pk-yrs)    Types: Cigarettes   Smokeless tobacco: Never   Tobacco comments:    0.5PPD 05/31/2023  Vaping Use   Vaping status: Never Used  Substance and Sexual Activity   Alcohol  use: Yes    Alcohol /week: 7.0 standard drinks of alcohol     Types: 7 Glasses of wine per week    Comment: one glass of wine daily after dinner    Drug use: Never   Sexual activity: Not Currently  Other Topics Concern   Not on file  Social History Narrative   Not on file   Social Drivers of Health   Financial Resource Strain: Low Risk  (09/01/2023)   Received from [facility]   Overall Financial Resource Strain (CARDIA)    Difficulty of Paying Living Expenses: Not hard at all  Food Insecurity: No Food Insecurity (06/14/2024)   Hunger Vital Sign    Worried About Running Out of Food in the Last Year: Never true    Ran Out of Food in the Last Year: Never true  Transportation Needs: No Transportation Needs (06/14/2024)   PRAPARE - Administrator, Civil Service (Medical): No    Lack of Transportation (Non-Medical): No  Physical Activity: Not on file  Stress: Not on file  Social Connections: Moderately Isolated (06/14/2024)   Social Connection and Isolation Panel    Frequency of Communication with Friends and Family: More than three times a week    Frequency of Social Gatherings with Friends and Family: More than three times a week    Attends Religious Services: More than 4 times per year    Active Member of [employer] or Organizations: No    Attends Banker Meetings:  Never    Marital Status: Widowed   Education Assessment and Provision:  Detailed education and instructions provided on heart failure disease management including the following:  Signs and symptoms of Heart Failure When to call the physician Importance of daily weights Low sodium diet Fluid restriction Medication management Anticipated future follow-up appointments  Patient education given on each of the above topics.  Patient acknowledges understanding via teach back method and acceptance of all instructions.  Education Materials:  Living Better With Heart Failure Booklet, HF zone tool, & Daily Weight Tracker Tool.  Patient has scale at home: Yes Patient has pill box at home: Yes    High Risk Criteria for Readmission and/or Poor  Patient Outcomes: Heart failure hospital admissions (last 6 months): 0  No Show rate: 5%  Difficult social situation: No, Lives with her daughter Demonstrates medication adherence: Yes Primary Language: English Literacy level: Reading, writing, and comprehension  Barriers of Care:   Diet/ fluid restrictions Daily weights  Considerations/Referrals:   Referral made to Heart Failure Pharmacist Stewardship: Yes Referral made to Heart Failure CSW/NCM TOC: No Referral made to Heart & Vascular TOC clinic: Yes, [facility] TOC 06/28/2024 @ 3 pm.   Items for Follow-up on DC/TOC: Continued HF education Diet/ fluid restrictions/ daily weights    Stephane Haddock, BSN, RN Heart Failure Teacher, adult education Only

## 2024-06-21 NOTE — Evaluation (Signed)
 Occupational Therapy Evaluation Patient Details Name: Stacey Sandoval MRN: [id number] DOB: [birth year] Today's Date: 06/21/2024   History of Present Illness   73 y.o. female admitted 7/3 for NESTEMI. Underwent cardiac catheterization 7/3. Underwent cardiac catheterization 7/8 with successful OTC guided CSI orbital atherectomy. Developed episode of melena with large black tarry bowel movement evening of 7/8. PMH: paroxysmal atrial fibrillation, COPD, chronic HFpEF.     Clinical Impressions  Pt admitted based on above, and was seen based on problem list below. PTA pt was independent with ADLs and light IADLs. Today pt is requiring set up  to s for safety for ADLs. Functional transfers range from supervision to CGA with cane, depending on endurance. Provided and reviewed energy conservation handout to aid in ADLs. Pt with decreased activity tolerance and strength, limiting performance. Pt would benefit from HHOT to build activity tolerance, and promote safety in home while managing O2 lines. OT will continue to follow acutely to maximize functional independence.     If plan is discharge home, recommend the following:   A little help with walking and/or transfers;A little help with bathing/dressing/bathroom;Assistance with cooking/housework     Functional Status Assessment   Patient has had a recent decline in their functional status and demonstrates the ability to make significant improvements in function in a reasonable and predictable amount of time.     Equipment Recommendations   None recommended by OT     Recommendations for Other Services         Precautions/Restrictions   Precautions Precautions: Fall Recall of Precautions/Restrictions: Intact Restrictions Weight Bearing Restrictions Per Provider Order: No     Mobility Bed Mobility Overal bed mobility: Needs Assistance       General bed mobility comments: Received from PT standing, left seated EOB     Transfers Overall transfer level: Needs assistance Equipment used: Straight cane Transfers: Sit to/from Stand Sit to Stand: Supervision     Step pivot transfers: Supervision, Contact guard assist     General transfer comment: Supervision to CGA for mobility depending on activity tolerance      Balance Overall balance assessment: Mild deficits observed, not formally tested       ADL either performed or assessed with clinical judgement   ADL Overall ADL's : Needs assistance/impaired Eating/Feeding: Set up;Sitting   Grooming: Supervision/safety;Standing           Upper Body Dressing : Set up;Sitting   Lower Body Dressing: Supervision/safety;Sit to/from stand   Toilet Transfer: Supervision/safety;Ambulation Toilet Transfer Details (indicate cue type and reason): use of SPC, simulated in hallway Toileting- Clothing Manipulation and Hygiene: Supervision/safety;Sit to/from stand       Functional mobility during ADLs: Supervision/safety;Contact guard assist;Cane General ADL Comments: Provided and reviewed energy conservation handout for ADLs     Vision Baseline Vision/History: 0 No visual deficits Vision Assessment?: No apparent visual deficits            Pertinent Vitals/Pain Pain Assessment Pain Assessment: No/denies pain     Extremity/Trunk Assessment Upper Extremity Assessment Upper Extremity Assessment: Generalized weakness   Lower Extremity Assessment Lower Extremity Assessment: Defer to PT evaluation   Cervical / Trunk Assessment Cervical / Trunk Assessment: Normal   Communication Communication Communication: No apparent difficulties   Cognition Arousal: Alert Behavior During Therapy: Flat affect     Following commands: Intact       Cueing  General Comments   Cueing Techniques: Verbal cues  VSS on 3L O2. Access Code: [email or web address]  URL: [email or web address]  Date: 06/21/2024  Prepared by: Adrianne Savers    Patient Education   - Understanding Energy Conservation  - Energy Conservation During Daily Tasks           Home Living Family/patient expects to be discharged to:: Private residence Living Arrangements: Children Available Help at Discharge: Family;Available 24 hours/day Type of Home: House Home Access: Stairs to enter [employer] of Steps: 3 Entrance Stairs-Rails: None Home Layout: One level     Bathroom Shower/Tub: Chief Strategy Officer: Handicapped height Bathroom Accessibility: Yes How Accessible: Accessible via walker Home Equipment: Rollator (4 wheels);Cane - single point;BSC/3in1;Shower seat;Grab bars - tub/shower          Prior Functioning/Environment Prior Level of Function : Independent/Modified Independent;Driving             Mobility Comments: Use of spc ADLs Comments: Pt manages own meds and light ADLs    OT Problem List: Decreased strength;Decreased activity tolerance;Impaired balance (sitting and/or standing);Cardiopulmonary status limiting activity   OT Treatment/Interventions: Self-care/ADL training;Therapeutic exercise;Energy conservation;DME and/or AE instruction;Therapeutic activities;Patient/family education;Balance training      OT Goals(Current goals can be found in the care plan section)   Acute Rehab OT Goals Patient Stated Goal: To go home OT Goal Formulation: With patient Time For Goal Achievement: 07/05/24 Potential to Achieve Goals: Good   OT Frequency:  Min 2X/week       AM-PAC OT 6 Clicks Daily Activity     Outcome Measure Help from another person eating meals?: None Help from another person taking care of personal grooming?: A Little Help from another person toileting, which includes using toliet, bedpan, or urinal?: A Little Help from another person bathing (including washing, rinsing, drying)?: A Little Help from another person to put on and taking off regular upper body clothing?: A Little Help from another person  to put on and taking off regular lower body clothing?: A Little 6 Click Score: 19   End of Session Equipment Utilized During Treatment: Other (comment);Oxygen  (3L, Cane) Nurse Communication: Mobility status  Activity Tolerance: Patient tolerated treatment well Patient left: in bed;with call bell/phone within reach;with family/visitor present  OT Visit Diagnosis: Unsteadiness on feet (R26.81);Other abnormalities of gait and mobility (R26.89);Muscle weakness (generalized) (M62.81)                Time: 8895-8876 OT Time Calculation (min): 19 min Charges:  OT General Charges $OT Visit: 1 Visit OT Evaluation $OT Eval Moderate Complexity: 1 Mod  Adrianne BROCKS, OT  Acute Rehabilitation Services Office [phone] Secure chat preferred   Adrianne GORMAN Savers 06/21/2024, 12:16 PM

## 2024-06-21 NOTE — Progress Notes (Addendum)
 Pt c/o feeling as though she is going to pass out, can't breathe & L eye w/ blurry vision. O2 sat 90-95% on 3L [place]. Neb & inhaler administered. Stroke assessment completed w/ no deficits noted. Vitals WNL. CBG 90. Pt appears anxious w/ some upper extremity trembling. Rapid RN otw to bedside. Gail, MD paged 2x's-no call back received but order for atarax  received. Rapid RN ordered CXR>slightly enlarged bilat pleural effusions noted. Pt states breathing feels a little better but feels like she can't clear her throat & nose is stuffy. Gail paged again & order received for saline nasal spray  **Pt was started on mucinex  DM ~ noon today. Per pt, she believes she had a similar reaction after mucinex  in the last 1-2 years. 2200 dose  mucinex  refused-med added to allergy/contraindications med list but is still currently in active orders**

## 2024-06-21 NOTE — Progress Notes (Signed)
 Patient's daughter expressing concerns about potential discharge. Concerns escalated to Dr. Wonda and Manuelita Rummer, NP. Manuelita Rummer NP came to bedside and talked with patient and daughter.

## 2024-06-21 NOTE — Progress Notes (Signed)
 Discussed with pt MI, stent, restrictions, Plavix  importance, smoking cessation, diet, exercise, NTG and CRPII. Pt receptive. Most of discussions spent on smoking cessation, pt would like to quit and acknowledges she has not smoked in 10 days. Gave tips and resources. Will refer to [facility] CRPII. Could consider pulmonary rehab as well.  [id number] Aliene Aris BS, ACSM-CEP 06/21/2024 1:24 PM

## 2024-06-24 NOTE — Telephone Encounter (Signed)
 Returned call from Rives, pt's daughter stated that her concerns were not relevant today because she wanted to talk with Dr. Perla on Friday prior to her mother's discharge from [facility];  she had concerns with discharging her mother home due to the fact that she reports that  her mother had hallucinations and was talking about an episode where pt stated she thought she was going to die; Suzen states that she spoke with her mother's bedside nurse to clarify events regarding ceiling tiles fall from the ceiling during the night and episode of feeling like she was going to die; daughter states that pt's RN reassured her that the ceiling tiles in her mother's room did not fall; Suzen states that her mother's RN told her that her mother did become SOB during the night, a chest x-ray was taken which showed fluid on the lungs, daughter states that pt was being treated with Lasix  in hospital and was also discharged home on Lasix ;  Pt's daughter states that pt has not had any further episode of hallucinations since being home; pt has follow up appointments scheduled with [facility] (7[facility]18) and [facility] ([facility]).  Pt's daughter thanked me for calling back; reassured her that I would forward her concerns to Dr. Gollan.

## 2024-06-27 ENCOUNTER — Telehealth: Payer: Self-pay | Admitting: Family

## 2024-06-27 NOTE — Telephone Encounter (Signed)
 Called to confirm/remind patient of their appointment at the [facility] on 06/28/24.   Appointment:   [x] Confirmed  [] Left mess   [] No answer/No voice mail  [] VM Full/unable to leave message  [] Phone not in service  Patient reminded to bring all medications and/or complete list.  Confirmed patient has transportation. Gave directions, instructed to utilize valet parking.

## 2024-06-28 ENCOUNTER — Ambulatory Visit: Payer: Self-pay | Admitting: Family

## 2024-06-28 ENCOUNTER — Encounter: Payer: Self-pay | Admitting: Family

## 2024-06-28 ENCOUNTER — Ambulatory Visit: Admitting: Family

## 2024-06-28 ENCOUNTER — Other Ambulatory Visit
Admission: RE | Admit: 2024-06-28 | Discharge: 2024-06-28 | Disposition: A | Discharge status: Home / Self Care | Source: Ambulatory Visit | Attending: Family | Admitting: Family

## 2024-06-28 VITALS — BP 118/64 | HR 74 | Wt 151.0 lb

## 2024-06-28 DIAGNOSIS — Z7901 Long term (current) use of anticoagulants: Secondary | ICD-10-CM | POA: Diagnosis not present

## 2024-06-28 DIAGNOSIS — I214 Non-ST elevation (NSTEMI) myocardial infarction: Secondary | ICD-10-CM | POA: Insufficient documentation

## 2024-06-28 DIAGNOSIS — I340 Nonrheumatic mitral (valve) insufficiency: Secondary | ICD-10-CM | POA: Insufficient documentation

## 2024-06-28 DIAGNOSIS — Z7902 Long term (current) use of antithrombotics/antiplatelets: Secondary | ICD-10-CM | POA: Diagnosis not present

## 2024-06-28 DIAGNOSIS — I255 Ischemic cardiomyopathy: Secondary | ICD-10-CM | POA: Diagnosis not present

## 2024-06-28 DIAGNOSIS — J449 Chronic obstructive pulmonary disease, unspecified: Secondary | ICD-10-CM | POA: Insufficient documentation

## 2024-06-28 DIAGNOSIS — Z7984 Long term (current) use of oral hypoglycemic drugs: Secondary | ICD-10-CM | POA: Diagnosis not present

## 2024-06-28 DIAGNOSIS — I5032 Chronic diastolic (congestive) heart failure: Secondary | ICD-10-CM

## 2024-06-28 DIAGNOSIS — E785 Hyperlipidemia, unspecified: Secondary | ICD-10-CM | POA: Insufficient documentation

## 2024-06-28 DIAGNOSIS — F17210 Nicotine dependence, cigarettes, uncomplicated: Secondary | ICD-10-CM | POA: Diagnosis not present

## 2024-06-28 DIAGNOSIS — I480 Paroxysmal atrial fibrillation: Secondary | ICD-10-CM

## 2024-06-28 DIAGNOSIS — I10 Essential (primary) hypertension: Secondary | ICD-10-CM | POA: Diagnosis not present

## 2024-06-28 DIAGNOSIS — I2510 Atherosclerotic heart disease of native coronary artery without angina pectoris: Secondary | ICD-10-CM | POA: Insufficient documentation

## 2024-06-28 DIAGNOSIS — Z79899 Other long term (current) drug therapy: Secondary | ICD-10-CM | POA: Diagnosis not present

## 2024-06-28 DIAGNOSIS — R600 Localized edema: Secondary | ICD-10-CM | POA: Insufficient documentation

## 2024-06-28 DIAGNOSIS — I8393 Asymptomatic varicose veins of bilateral lower extremities: Secondary | ICD-10-CM | POA: Insufficient documentation

## 2024-06-28 DIAGNOSIS — I110 Hypertensive heart disease with heart failure: Secondary | ICD-10-CM | POA: Diagnosis not present

## 2024-06-28 DIAGNOSIS — E782 Mixed hyperlipidemia: Secondary | ICD-10-CM | POA: Diagnosis not present

## 2024-06-28 LAB — BASIC METABOLIC PANEL WITH GFR
Anion gap: 14 (ref 5–15)
BUN: 14 mg/dL (ref 8–23)
CO2: 34 mmol/L — ABNORMAL HIGH (ref 22–32)
Calcium: 9.8 mg/dL (ref 8.9–10.3)
Chloride: 88 mmol/L — ABNORMAL LOW (ref 98–111)
Creatinine, Ser: 0.96 mg/dL (ref 0.44–1.00)
GFR, Estimated: >60 mL/min (ref >60)
Glucose, Bld: 100 mg/dL — ABNORMAL HIGH (ref 70–99)
Potassium: 4.9 mmol/L (ref 3.5–5.1)
Sodium: 136 mmol/L (ref 135–145)

## 2024-06-28 MED ORDER — SPIRONOLACTONE 25 MG PO TABS: 25.0000 mg | ORAL_TABLET | Freq: Every day | ORAL | Status: DC

## 2024-06-28 NOTE — Progress Notes (Signed)
 [facility] Note   Referring Physician: admission [facility]CP: [facility] Cardiologist: Evalene Lunger, MD   Chief Complaint: fatigue   HPI:  Stacey Sandoval is a 73 y/o female with a history of paroxysmal atrial fibrillation, COPD, HFpEF, current smoker x 40+ years, NSTEMI (07/25), enlarged thyroid , hyperlipidemia, AVM (arteriovenous malformation) of small bowel, anemia, COPD and HTN.   Admitted 06/12/24 with acute chest pain that woke her from sleep. Radiation to left side of neck and arm. Troponin up trended from 25 -->479->707 and the ER initiated a heparin  drip. CTA of the chest was negative for PE. Found to have NSTEMI. Echo 06/12/24: EF 60-65%, mild LVH, G2DD, normal RV, mild MR. Taken for [facility] 06/13/24 revealing widespread stenosis in the RCA. Load with antiplatelets therapy restart IV heparin  2 hours post TR band removal. Cardiology has arranged transfer to [facility] with plan for atherectomy-based PCI to the RCA.   Admitted 06/13/24 from OSH for atherectomy and PCI. Initially planned for 7/7 but was significantly dyspneic and tachycardic on exam having difficulty lying flat. Underwent cardiac catheterization 7/8 with successful OTC guided CSI orbital atherectomy with DES x2. Recommendations initially for DAPT with aspirin /Plavix  for 1 month, given the need for Eliquis . Then plan to drop aspirin  after 30 days. With GI bleed, will plan to stop ASA once Eliquis  resumed and continue plavix . Developed episode of melena with large black tarry bowel movement evening of 7/8. 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 IV diuresed.   She presents today, with her daughter, for her initial HF visit with a chief complaint of fatigue (improving). Has associated pedal edema. Taking lasix  as needed and has taken it twice this week. Sleeping well on 2 pillows. Wearing oxygen  at 2L around the clock.   Review  of Systems: [y] = yes, [ ]  = no   General: Weight gain [ ] ; Weight loss [ ] ; Anorexia [ ] ; Fatigue Davis.Dad ]; Fever [ ] ; Chills [ ] ; Weakness [ ]   Cardiac: Chest pain/pressure [ ] ; Resting SOB [ ] ; Exertional SOB [ ] ; Orthopnea [ ] ; Pedal Edema Davis.Dad ]; Palpitations [ ] ; Syncope [ ] ; Presyncope [ ] ; Paroxysmal nocturnal dyspnea[ ]   Pulmonary: Cough [ ] ; Wheezing[ ] ; Hemoptysis[ ] ; Sputum [ ] ; Snoring [ ]   GI: Vomiting[ ] ; Dysphagia[ ] ; Melena[ ] ; Hematochezia [ ] ; Heartburn[ ] ; Abdominal pain [ ] ; Constipation [ ] ; Diarrhea [ ] ; BRBPR [ ]   GU: Hematuria[ ] ; Dysuria [ ] ; Nocturia[ ]   Vascular: Pain in legs with walking [ ] ; Pain in feet with lying flat [ ] ; Non-healing sores [ ] ; Stroke [ ] ; TIA [ ] ; Slurred speech [ ] ;  Neuro: Headaches[ ] ; Vertigo[ ] ; Seizures[ ] ; Paresthesias[ ] ;Blurred vision [ ] ; Diplopia [ ] ; Vision changes [ ]   Ortho/Skin: Arthritis [ ] ; Joint pain [ ] ; Muscle pain [ ] ; Joint swelling [ ] ; Back Pain [ ] ; Rash [ ]   Psych: Depression[ ] ; Anxiety[ ]   Heme: Bleeding problems [ ] ; Clotting disorders [ ] ; Anemia Davis.Dad ]  Endocrine: Diabetes [ ] ; Thyroid  dysfunction[ ]    Past Medical History:  Diagnosis Date   Atrial fibrillation (HCC)    CHF (congestive heart failure) (HCC)    Complication of anesthesia    VERY slow to wake after hysterectomy   COPD (chronic obstructive pulmonary disease) (HCC)    Enlarged thyroid     has been referred to [facility]  Hyperlipidemia    Hypertension    Smokers' cough (HCC)    Tobacco use    Wears dentures    Full upper and lower    Current Outpatient Medications  Medication Sig Dispense Refill   acetaminophen  (TYLENOL ) 650 MG CR tablet Take 1,300 mg by mouth 2 (two) times daily.     albuterol  (PROVENTIL ) (2.5 MG/3ML) 0.083% nebulizer solution Take 3 mLs (2.5 mg total) by nebulization every 6 (six) hours as needed for wheezing or shortness of breath. 75 mL 2   apixaban  (ELIQUIS ) 5 MG TABS tablet Take 1 tablet (5 mg total) by mouth 2 (two) times daily.      atorvastatin  (LIPITOR) 40 MG tablet Take 1 tablet (40 mg total) by mouth daily.     bisoprolol  (ZEBETA ) 5 MG tablet TAKE 1 TABLET BY MOUTH EVERY DAY (Patient taking differently: Take 5 mg by mouth 2 (two) times daily.) 90 tablet 3   Budeson-Glycopyrrol-Formoterol  (BREZTRI  AEROSPHERE) 160-9-4.8 MCG/ACT AERO Inhale 2 puffs into the lungs in the morning and at bedtime. 5.9 g 0   calcitRIOL (ROCALTROL) 0.25 MCG capsule Take 0.25 mcg by mouth 2 (two) times daily.     clopidogrel  (PLAVIX ) 75 MG tablet Take 1 tablet (75 mg total) by mouth daily. 90 tablet 2   dapagliflozin  propanediol (FARXIGA ) 10 MG TABS tablet Take 1 tablet (10 mg total) by mouth daily. 90 tablet 1   diltiazem  (CARDIZEM  CD) 180 MG 24 hr capsule TAKE 1 CAPSULE(180 MG) BY MOUTH DAILY 90 capsule 2   diltiazem  (CARDIZEM ) 30 MG tablet Take 1 tablet (30 mg total) by mouth as needed (atrial fibrillation). 30 tablet 3   Ferrous Sulfate  (IRON ) 325 (65 Fe) MG TABS Take 1 tablet (325 mg total) by mouth in the morning and at bedtime. 60 tablet 0   furosemide  (LASIX ) 20 MG tablet Take 1 tablet (20 mg total) by mouth daily as needed. 30 tablet 2   gabapentin  (NEURONTIN ) 300 MG capsule Take 300 mg every am & 600 in the pm     levothyroxine (SYNTHROID) 100 MCG tablet Take 100 mcg by mouth daily before breakfast.     mirtazapine  (REMERON ) 45 MG tablet Take 45 mg by mouth at bedtime.     nitroGLYCERIN  (NITROSTAT ) 0.4 MG SL tablet Place 1 tablet (0.4 mg total) under the tongue every 5 (five) minutes x 3 doses as needed for chest pain. 25 tablet 1   OXYGEN  Inhale into the lungs as needed. 2-3 Liters     pantoprazole  (PROTONIX ) 40 MG tablet Take 1 tablet (40 mg total) by mouth 2 (two) times daily before a meal for 30 days, THEN 1 tablet (40 mg total) daily. 90 tablet 0   spironolactone  (ALDACTONE ) 25 MG tablet Take 0.5 tablets (12.5 mg total) by mouth daily. 30 tablet 0   triamcinolone ointment (KENALOG) 0.5 % Apply 1 Application topically 2 (two) times  daily.     No current facility-administered medications for this visit.    Allergies  Allergen Reactions   Dextromethorphan -Guaifenesin  Shortness Of Breath, Anxiety and Other (See Comments)    Globus pharyngeus      Social History   Socioeconomic History   Marital status: Widowed    Spouse name: Not on file   Number of children: 6   Years of education: Not on file   Highest education level: Not on file  Occupational History   Occupation: retired  Tobacco Use   Smoking status: Every Day    Current packs/day: 0.25  Average packs/day: 0.3 packs/day for 40.0 years (10.0 ttl pk-yrs)    Types: Cigarettes   Smokeless tobacco: Never   Tobacco comments:    0.5PPD 05/31/2023  Vaping Use   Vaping status: Never Used  Substance and Sexual Activity   Alcohol  use: Yes    Alcohol /week: 7.0 standard drinks of alcohol     Types: 7 Glasses of wine per week    Comment: one glass of wine daily after dinner   Drug use: Never   Sexual activity: Not Currently  Other Topics Concern   Not on file  Social History Narrative   Not on file   Social Drivers of Health   Financial Resource Strain: Low Risk  (06/21/2024)   Overall Financial Resource Strain (CARDIA)    Difficulty of Paying Living Expenses: Not very hard  Food Insecurity: No Food Insecurity (06/14/2024)   Hunger Vital Sign    Worried About Running Out of Food in the Last Year: Never true    Ran Out of Food in the Last Year: Never true  Transportation Needs: No Transportation Needs (06/21/2024)   PRAPARE - Administrator, Civil Service (Medical): No    Lack of Transportation (Non-Medical): No  Physical Activity: Not on file  Stress: Not on file  Social Connections: Moderately Isolated (06/14/2024)   Social Connection and Isolation Panel    Frequency of Communication with Friends and Family: More than three times a week    Frequency of Social Gatherings with Friends and Family: More than three times a week    Attends  Religious Services: More than 4 times per year    Active Member of [employer] or Organizations: No    Attends Banker Meetings: Never    Marital Status: Widowed  Intimate Partner Violence: Not At Risk (06/14/2024)   Humiliation, Afraid, Rape, and Kick questionnaire    Fear of Current or Ex-Partner: No    Emotionally Abused: No    Physically Abused: No    Sexually Abused: No      Family History  Problem Relation Age of Onset   Hypertension Mother    Heart disease Mother        CABG   Heart disease Father    Heart attack Father    Vitals:   06/28/24 1509  BP: 118/64  Pulse: 74  SpO2: 95%  Weight: 151 lb (68.5 kg)   Wt Readings from Last 3 Encounters:  06/28/24 151 lb (68.5 kg)  06/21/24 154 lb 5.2 oz (70 kg)  06/12/24 161 lb 4.8 oz (73.2 kg)   Lab Results  Component Value Date   CREATININE 0.96 06/28/2024   CREATININE 0.69 06/21/2024   CREATININE 0.71 06/20/2024    PHYSICAL EXAM:  General: Frail appearing in wheelchair with oxygen  on.  Cor: No JVD. Regular rhythm, rate.  Lungs: rhonchi scattered throughout Abdomen: soft, nontender, nondistended. Extremities: 1+ pitting edema bilateral lower legs. Varicosities bilateral lower legs/ feet Neuro:. Affect pleasant   ECG: NSR with PVC's, HR 75 (personally reviewed)  ReDs reading: 37 %, abnormal (see plan below)    ASSESSMENT & PLAN:  1: ICM with preserved ejection fraction- - recent NSTEMI 07/25 with DES X 2 - NYHA Sandoval II - minimally fluid up with elevated ReDs reading & symptoms - weighing daily; parameters to call about discussed - ReDs today is 37% - Echo 06/12/24: EF 60-65%, mild LVH, G2DD, normal RV, mild MR. - continue bisoprolol  5mg  BID - continue farxiga  10mg  daily - take  furosemide  20mg  daily for the next 3 days and then reduce back down to PRN - continue spironolactone  25mg  daily - BMET today - resume wearing compression socks daily with removal at bedtime - BNP 06/20/24 was 303.6  2:  CAD/ NSTEMI- - Underwent cardiac catheterization 06/18/24 with successful OTC guided CSI orbital atherectomy with DES x2.  - to start cardiac rehab - saw cardiology Florestine) 06/24; returns 08/25 - continue clopidogrel  75mg  daily  3: HTN- - BP 118/64 - saw PCP @ [facility] 03/25. Returns 07/08/24 - BMP 06/21/24 reviewed: sodium 138, potassium 3.1, creatinine 0.69 & GFR >60  4: HLD-  - LDL 06/14/24 was 49 - lipo (a) 06/14/24 was <8.4 - continue atorvastatin  40mg  daily  5: PAF-  - EKG today is NSR, HR 75 - continue apixaban  5mg  BID - continue diltiazem  180mg  daily and 30mg  PRN   Return in 1 month, sooner if needed.   Stacey DELENA Class, FNP 06/28/24

## 2024-06-28 NOTE — Progress Notes (Signed)
 ReDS Vest / Clip - 06/28/24 1509       ReDS Vest / Clip   Station Marker A    Ruler Value 32    ReDS Value Range Moderate volume overload    ReDS Actual Value 37

## 2024-06-28 NOTE — Patient Instructions (Addendum)
 Medication Changes:  TAKE LASIX  SATURDAY, SUNDAY AND MONDAY.   Lab Work:  Go over to the [facility]ICAL [facility]ALL. Go pass the gift shop and have your blood work completed.  We will only call you if the results are abnormal or if the provider would like to make medication changes.    Special Instructions // Education:  WEAR COMPRESSION SOCKS AND REMOVE THEM BEFORE BEDTIME   Follow-Up in: 1 MONTH WITH TINA HACKNEY, FNP.  At the [facility], you and your health needs are our priority. We have a designated team specialized in the treatment of Heart Failure. This Care Team includes your primary Heart Failure Specialized Cardiologist (physician), Advanced Practice Providers (APPs- Physician Assistants and Nurse Practitioners), and Pharmacist who all work together to provide you with the care you need, when you need it.   You may see any of the following providers on your designated Care Team at your next follow up:  Dr. Toribio Fuel Dr. Ezra Shuck Dr. Ria Commander Dr. Odis Brownie Ellouise Class, FNP Jaun Bash, RPH-CPP  Please be sure to bring in all your medications bottles to every appointment.   Need to Contact Us :  If you have any questions or concerns before your next appointment please send us  a message through [place] or call our office at [phone].    TO LEAVE A MESSAGE FOR THE NURSE SELECT OPTION 2, PLEASE LEAVE A MESSAGE INCLUDING: YOUR NAME DATE OF BIRTH CALL BACK NUMBER REASON FOR CALL**this is important as we prioritize the call backs  YOU WILL RECEIVE A CALL BACK THE SAME DAY AS LONG AS YOU CALL BEFORE 4:00 PM

## 2024-07-02 ENCOUNTER — Encounter

## 2024-07-03 ENCOUNTER — Encounter: Attending: Cardiovascular Disease

## 2024-07-03 ENCOUNTER — Other Ambulatory Visit: Payer: Self-pay

## 2024-07-03 DIAGNOSIS — Z48812 Encounter for surgical aftercare following surgery on the circulatory system: Secondary | ICD-10-CM | POA: Insufficient documentation

## 2024-07-03 DIAGNOSIS — Z955 Presence of coronary angioplasty implant and graft: Secondary | ICD-10-CM | POA: Insufficient documentation

## 2024-07-03 DIAGNOSIS — I214 Non-ST elevation (NSTEMI) myocardial infarction: Secondary | ICD-10-CM | POA: Insufficient documentation

## 2024-07-03 NOTE — Progress Notes (Signed)
 Virtual Visit completed. Patient informed on EP and RD appointment and 6 Minute walk test. Patient also informed of patient health questionnaires on My Chart. Patient Verbalizes understanding. Visit diagnosis can be found in [facility] 06/13/2024.

## 2024-07-09 ENCOUNTER — Encounter

## 2024-07-09 VITALS — Ht 59.8 in | Wt 149.5 lb

## 2024-07-09 DIAGNOSIS — Z48812 Encounter for surgical aftercare following surgery on the circulatory system: Secondary | ICD-10-CM | POA: Diagnosis present

## 2024-07-09 DIAGNOSIS — I214 Non-ST elevation (NSTEMI) myocardial infarction: Secondary | ICD-10-CM | POA: Diagnosis present

## 2024-07-09 DIAGNOSIS — Z955 Presence of coronary angioplasty implant and graft: Secondary | ICD-10-CM | POA: Diagnosis present

## 2024-07-09 NOTE — Progress Notes (Signed)
 Cardiac Individual Treatment Plan  Patient Details  Name: Stacey Sandoval MRN: [id number] Date of Birth: [birth year] Referring Provider:   Flowsheet Row Cardiac Rehab from 07/09/2024 in [facility] Cardiac and Pulmonary Rehab  Referring Provider Dr. Evalene Lunger    Initial Encounter Date:  Flowsheet Row Cardiac Rehab from 07/09/2024 in [facility] Cardiac and Pulmonary Rehab  Date 07/09/24    Visit Diagnosis: NSTEMI (non-ST elevated myocardial infarction) [facility])  Patient's Home Medications on Admission:  Current Outpatient Medications:    acetaminophen  (TYLENOL ) 650 MG CR tablet, Take 1,300 mg by mouth 2 (two) times daily., Disp: , Rfl:    albuterol  (PROVENTIL ) (2.5 MG/3ML) 0.083% nebulizer solution, Take 3 mLs (2.5 mg total) by nebulization every 6 (six) hours as needed for wheezing or shortness of breath., Disp: 75 mL, Rfl: 2   apixaban  (ELIQUIS ) 5 MG TABS tablet, Take 1 tablet (5 mg total) by mouth 2 (two) times daily., Disp: , Rfl:    atorvastatin  (LIPITOR) 40 MG tablet, Take 1 tablet (40 mg total) by mouth daily., Disp: , Rfl:    bisoprolol  (ZEBETA ) 5 MG tablet, TAKE 1 TABLET BY MOUTH EVERY DAY, Disp: 90 tablet, Rfl: 3   Budeson-Glycopyrrol-Formoterol  (BREZTRI  AEROSPHERE) 160-9-4.8 MCG/ACT AERO, Inhale 2 puffs into the lungs in the morning and at bedtime., Disp: 5.9 g, Rfl: 0   calcitRIOL (ROCALTROL) 0.25 MCG capsule, Take 0.25 mcg by mouth 2 (two) times daily., Disp: , Rfl:    clopidogrel  (PLAVIX ) 75 MG tablet, Take 1 tablet (75 mg total) by mouth daily., Disp: 90 tablet, Rfl: 2   dapagliflozin  propanediol (FARXIGA ) 10 MG TABS tablet, Take 1 tablet (10 mg total) by mouth daily., Disp: 90 tablet, Rfl: 1   diltiazem  (CARDIZEM  CD) 180 MG 24 hr capsule, TAKE 1 CAPSULE(180 MG) BY MOUTH DAILY, Disp: 90 capsule, Rfl: 2   diltiazem  (CARDIZEM ) 30 MG tablet, Take 1 tablet (30 mg total) by mouth as needed (atrial fibrillation)., Disp: 30 tablet, Rfl: 3   Ferrous Sulfate  (IRON ) 325 (65 Fe) MG TABS, Take 1  tablet (325 mg total) by mouth in the morning and at bedtime., Disp: 60 tablet, Rfl: 0   furosemide  (LASIX ) 20 MG tablet, Take 1 tablet (20 mg total) by mouth daily as needed., Disp: 30 tablet, Rfl: 2   gabapentin  (NEURONTIN ) 300 MG capsule, Take 300 mg every am & 600 in the pm, Disp: , Rfl:    levothyroxine (SYNTHROID) 100 MCG tablet, Take 100 mcg by mouth daily before breakfast., Disp: , Rfl:    mirtazapine  (REMERON ) 45 MG tablet, Take 45 mg by mouth at bedtime., Disp: , Rfl:    nitroGLYCERIN  (NITROSTAT ) 0.4 MG SL tablet, Place 1 tablet (0.4 mg total) under the tongue every 5 (five) minutes x 3 doses as needed for chest pain., Disp: 25 tablet, Rfl: 1   OXYGEN , Inhale into the lungs as needed. 2-3 Liters, Disp: , Rfl:    pantoprazole  (PROTONIX ) 40 MG tablet, Take 1 tablet (40 mg total) by mouth 2 (two) times daily before a meal for 30 days, THEN 1 tablet (40 mg total) daily., Disp: 90 tablet, Rfl: 0   potassium chloride  SA (KLOR-CON  M) 20 MEQ tablet, Take 20 mEq by mouth 4 (four) times daily as needed (prn)., Disp: , Rfl:    spironolactone  (ALDACTONE ) 25 MG tablet, Take 1 tablet (25 mg total) by mouth daily., Disp: , Rfl:    triamcinolone ointment (KENALOG) 0.5 %, Apply 1 Application topically 2 (two) times daily., Disp: , Rfl:  Past Medical History: Past Medical History:  Diagnosis Date   Atrial fibrillation (HCC)    CHF (congestive heart failure) (HCC)    Complication of anesthesia    VERY slow to wake after hysterectomy   COPD (chronic obstructive pulmonary disease) (HCC)    Enlarged thyroid     has been referred to [facility]   Hyperlipidemia    Hypertension    Smokers' cough (HCC)    Tobacco use    Wears dentures    Full upper and lower    Tobacco Use: Social History   Tobacco Use  Smoking Status Every Day   Current packs/day: 0.25   Average packs/day: 0.3 packs/day for 40.0 years (10.0 ttl pk-yrs)   Types: Cigarettes  Smokeless Tobacco Never  Tobacco Comments   0.5PPD  05/31/2023    Labs: Review Flowsheet       Latest Ref Rng & Units 03/08/2022 06/14/2024 06/15/2024 06/18/2024  Labs for ITP Cardiac and Pulmonary Rehab  Cholestrol 0 - 200 mg/dL - 889  - -  LDL (calc) 0 - 99 mg/dL - 49  - -  HDL-C >59 mg/dL - 49  - -  Trlycerides <150 mg/dL - 60  - -  PH, Arterial 7.35 - 7.45 7.31  - - 7.298  7.291   PCO2 arterial 32 - 48 mmHg 65  - - 72.7  73.7   Bicarbonate 20.0 - 28.0 mmol/L 32.7  - 38.8  34.1  35.6  35.5   TCO2 22 - 32 mmol/L - - - 38  38   O2 Saturation % 100  - 73.6  93.4  78  62     Details       Multiple values from one day are sorted in reverse-chronological order          Exercise Target Goals: Exercise Program Goal: Individual exercise prescription set using results from initial 6 min walk test and THRR while considering  patient's activity barriers and safety.   Exercise Prescription Goal: Initial exercise prescription builds to 30-45 minutes a day of aerobic activity, 2-3 days per week.  Home exercise guidelines will be given to patient during program as part of exercise prescription that the participant will acknowledge.   Education: Aerobic Exercise: - Group verbal and visual presentation on the components of exercise prescription. Introduces F.I.T.T principle from ACSM for exercise prescriptions.  Reviews F.I.T.T. principles of aerobic exercise including progression. Written material given at graduation.   Education: Resistance Exercise: - Group verbal and visual presentation on the components of exercise prescription. Introduces F.I.T.T principle from ACSM for exercise prescriptions  Reviews F.I.T.T. principles of resistance exercise including progression. Written material given at graduation.    Education: Exercise & Equipment Safety: - Individual verbal instruction and demonstration of equipment use and safety with use of the equipment. Flowsheet Row Cardiac Rehab from 07/09/2024 in [facility] Cardiac and Pulmonary Rehab  Date  07/09/24  Educator [facility]  Instruction Review Code 1- Verbalizes Understanding    Education: Exercise Physiology & General Exercise Guidelines: - Group verbal and written instruction with models to review the exercise physiology of the cardiovascular system and associated critical values. Provides general exercise guidelines with specific guidelines to those with heart or lung disease.    Education: Flexibility, Balance, Mind/Body Relaxation: - Group verbal and visual presentation with interactive activity on the components of exercise prescription. Introduces F.I.T.T principle from ACSM for exercise prescriptions. Reviews F.I.T.T. principles of flexibility and balance exercise training including progression. Also discusses the mind body  connection.  Reviews various relaxation techniques to help reduce and manage stress (i.e. Deep breathing, progressive muscle relaxation, and visualization). Balance handout provided to take home. Written material given at graduation.   Activity Barriers & Risk Stratification:  Activity Barriers & Cardiac Risk Stratification - 07/09/24 1505       Activity Barriers & Cardiac Risk Stratification   Activity Barriers Arthritis;Back Problems;Deconditioning;Shortness of Breath;Assistive Device    Cardiac Risk Stratification Moderate          6 Minute Walk:  6 Minute Walk     Row Name 07/09/24 1503         6 Minute Walk   Phase Initial     Distance 570 feet     Walk Time 6 minutes     # of Rest Breaks 0     MPH 1.08     METS 2     RPE 13     Perceived Dyspnea  2     VO2 Peak 3.6     Symptoms Yes (comment)     Comments Right hip pain, hamstring discomfort     Resting HR 73 bpm     Resting BP 110/58     Resting Oxygen  Saturation  96 %     Exercise Oxygen  Saturation  during 6 min walk 98 %     Max Ex. HR 94 bpm     Max Ex. BP 126/60     2 Minute Post BP 118/58        Oxygen  Initial Assessment:   Oxygen  Re-Evaluation:   Oxygen  Discharge  (Final Oxygen  Re-Evaluation):   Initial Exercise Prescription:  Initial Exercise Prescription - 07/09/24 1500       Date of Initial Exercise RX and Referring Provider   Date 07/09/24    Referring Provider Dr. Timothy Gollan      Oxygen    Oxygen  Continuous    Liters 2L    Maintain Oxygen  Saturation 88% or higher      Recumbant Bike   Level 1    RPM 50    Watts 25    Minutes 15    METs 2      NuStep   Level 1    SPM 80    Minutes 15    METs 2      Arm Ergometer   Level 1    Watts 25    RPM 25    Minutes 15    METs 2      Track   Laps 15    Minutes 15    METs 1.82      Prescription Details   Duration Progress to 30 minutes of continuous aerobic without signs/symptoms of physical distress      Intensity   THRR 40-80% of Max Heartrate 101-130    Ratings of Perceived Exertion 11-13    Perceived Dyspnea 0-4      Progression   Progression Continue to progress workloads to maintain intensity without signs/symptoms of physical distress.      Resistance Training   Training Prescription Yes    Weight 3lb    Reps 10-15          Perform Capillary Blood Glucose checks as needed.  Exercise Prescription Changes:   Exercise Prescription Changes     Row Name 07/09/24 1500             Response to Exercise   Blood Pressure (Admit) 110/58       Blood Pressure (  Exercise) 126/60       Blood Pressure (Exit) 118/58       Heart Rate (Admit) 73 bpm       Heart Rate (Exercise) 94 bpm       Heart Rate (Exit) 75 bpm       Oxygen  Saturation (Admit) 96 %       Oxygen  Saturation (Exercise) 98 %       Oxygen  Saturation (Exit) 98 %       Rating of Perceived Exertion (Exercise) 13       Perceived Dyspnea (Exercise) 2       Symptoms bilateral hip pain, hamstring discomfort       Comments results          Exercise Comments:   Exercise Goals and Review:   Exercise Goals     Row Name 07/09/24 1509             Exercise Goals   Increase Physical  Activity Yes       Intervention Provide advice, education, support and counseling about physical activity/exercise needs.;Develop an individualized exercise prescription for aerobic and resistive training based on initial evaluation findings, risk stratification, comorbidities and participant's personal goals.       Expected Outcomes Short Term: Attend rehab on a regular basis to increase amount of physical activity.;Long Term: Add in home exercise to make exercise part of routine and to increase amount of physical activity.;Long Term: Exercising regularly at least 3-5 days a week.       Increase Strength and Stamina Yes       Intervention Provide advice, education, support and counseling about physical activity/exercise needs.;Develop an individualized exercise prescription for aerobic and resistive training based on initial evaluation findings, risk stratification, comorbidities and participant's personal goals.       Expected Outcomes Short Term: Increase workloads from initial exercise prescription for resistance, speed, and METs.;Short Term: Perform resistance training exercises routinely during rehab and add in resistance training at home;Long Term: Improve cardiorespiratory fitness, muscular endurance and strength as measured by increased METs and functional capacity ( )       Able to understand and use rate of perceived exertion (RPE) scale Yes       Intervention Provide education and explanation on how to use RPE scale       Expected Outcomes Long Term:  Able to use RPE to guide intensity level when exercising independently;Short Term: Able to use RPE daily in rehab to express subjective intensity level       Able to understand and use Dyspnea scale Yes       Intervention Provide education and explanation on how to use Dyspnea scale       Expected Outcomes Long Term: Able to use Dyspnea scale to guide intensity level when exercising independently;Short Term: Able to use Dyspnea scale daily in  rehab to express subjective sense of shortness of breath during exertion       Knowledge and understanding of Target Heart Rate Range (THRR) Yes       Intervention Provide education and explanation of THRR including how the numbers were predicted and where they are located for reference       Expected Outcomes Short Term: Able to state/look up THRR;Short Term: Able to use daily as guideline for intensity in rehab;Long Term: Able to use THRR to govern intensity when exercising independently       Able to check pulse independently Yes  Intervention Provide education and demonstration on how to check pulse in carotid and radial arteries.;Review the importance of being able to check your own pulse for safety during independent exercise       Expected Outcomes Short Term: Able to explain why pulse checking is important during independent exercise;Long Term: Able to check pulse independently and accurately       Understanding of Exercise Prescription Yes       Intervention Provide education, explanation, and written materials on patient's individual exercise prescription       Expected Outcomes Short Term: Able to explain program exercise prescription;Long Term: Able to explain home exercise prescription to exercise independently          Exercise Goals Re-Evaluation :   Discharge Exercise Prescription (Final Exercise Prescription Changes):  Exercise Prescription Changes - 07/09/24 1500       Response to Exercise   Blood Pressure (Admit) 110/58    Blood Pressure (Exercise) 126/60    Blood Pressure (Exit) 118/58    Heart Rate (Admit) 73 bpm    Heart Rate (Exercise) 94 bpm    Heart Rate (Exit) 75 bpm    Oxygen  Saturation (Admit) 96 %    Oxygen  Saturation (Exercise) 98 %    Oxygen  Saturation (Exit) 98 %    Rating of Perceived Exertion (Exercise) 13    Perceived Dyspnea (Exercise) 2    Symptoms bilateral hip pain, hamstring discomfort    Comments results          Nutrition:   Target Goals: Understanding of nutrition guidelines, daily intake of sodium 1500mg , cholesterol 200mg , calories 30% from fat and 7% or less from saturated fats, daily to have 5 or more servings of fruits and vegetables.  Education: All About Nutrition: -Group instruction provided by verbal, written material, interactive activities, discussions, models, and posters to present general guidelines for heart healthy nutrition including fat, fiber, MyPlate, the role of sodium in heart healthy nutrition, utilization of the nutrition label, and utilization of this knowledge for meal planning. Follow up email sent as well. Written material given at graduation.   Biometrics:  Pre Biometrics - 07/09/24 1509       Pre Biometrics   Height 4' 11.8 (1.519 m)    Weight 149 lb 8 oz (67.8 kg)    Waist Circumference 38 inches    Hip Circumference 40.5 inches    Waist to Hip Ratio 0.94 %    BMI (Calculated) 29.39    Single Leg Stand 6.81 seconds           Nutrition Therapy Plan and Nutrition Goals:   Nutrition Assessments:  MEDIFICTS Score Key: >=70 Need to make dietary changes  40-70 Heart Healthy Diet <= 40 Therapeutic Level Cholesterol Diet   Picture Your Plate Scores: <59 Unhealthy dietary pattern with much room for improvement. 41-50 Dietary pattern unlikely to meet recommendations for good health and room for improvement. 51-60 More healthful dietary pattern, with some room for improvement.  >60 Healthy dietary pattern, although there may be some specific behaviors that could be improved.    Nutrition Goals Re-Evaluation:   Nutrition Goals Discharge (Final Nutrition Goals Re-Evaluation):   Psychosocial: Target Goals: Acknowledge presence or absence of significant depression and/or stress, maximize coping skills, provide positive support system. Participant is able to verbalize types and ability to use techniques and skills needed for reducing stress and depression.    Education: Stress, Anxiety, and Depression - Group verbal and visual presentation to define topics covered.  Reviews how body is impacted by stress, anxiety, and depression.  Also discusses healthy ways to reduce stress and to treat/manage anxiety and depression.  Written material given at graduation.   Education: Sleep Hygiene -Provides group verbal and written instruction about how sleep can affect your health.  Define sleep hygiene, discuss sleep cycles and impact of sleep habits. Review good sleep hygiene tips.    Initial Review & Psychosocial Screening:  Initial Psych Review & Screening - 07/03/24 1428       Initial Review   Current issues with None Identified      Family Dynamics   Good Support System? Yes    Comments Caffie states she can look to her daughter Luke for support. She denies any mental instability and takes no medications for her mood.      Barriers   Psychosocial barriers to participate in program The patient should benefit from training in stress management and relaxation.;There are no identifiable barriers or psychosocial needs.      Screening Interventions   Interventions Provide feedback about the scores to participant;To provide support and resources with identified psychosocial needs;Encouraged to exercise    Expected Outcomes Short Term goal: Utilizing psychosocial counselor, staff and physician to assist with identification of specific Stressors or current issues interfering with healing process. Setting desired goal for each stressor or current issue identified.;Long Term Goal: Stressors or current issues are controlled or eliminated.;Short Term goal: Identification and review with participant of any Quality of Life or Depression concerns found by scoring the questionnaire.;Long Term goal: The participant improves quality of Life and PHQ9 Scores as seen by post scores and/or verbalization of changes          Quality of Life Scores:   Scores of 19 and  below usually indicate a poorer quality of life in these areas.  A difference of  2-3 points is a clinically meaningful difference.  A difference of 2-3 points in the total score of the Quality of Life Index has been associated with significant improvement in overall quality of life, self-image, physical symptoms, and general health in studies assessing change in quality of life.  PHQ-9: Review Flowsheet       07/09/2024  Depression screen PHQ 2/9  Decreased Interest 0  Down, Depressed, Hopeless 1  PHQ - 2 Score 1  Altered sleeping 1  Tired, decreased energy 1  Change in appetite 0  Feeling bad or failure about yourself  0  Trouble concentrating 0  Moving slowly or fidgety/restless 2  Suicidal thoughts 0  PHQ-9 Score 5  Difficult doing work/chores Not difficult at all   Interpretation of Total Score  Total Score Depression Severity:  1-4 = Minimal depression, 5-9 = Mild depression, 10-14 = Moderate depression, 15-19 = Moderately severe depression, 20-27 = Severe depression   Psychosocial Evaluation and Intervention:  Psychosocial Evaluation - 07/03/24 1433       Psychosocial Evaluation & Interventions   Interventions Encouraged to exercise with the program and follow exercise prescription;Relaxation education;Stress management education    Comments Chance states she can look to her daughter Luke for support. She denies any mental instability and takes no medications for her mood.    Expected Outcomes Short: Start [facility] to help with mood. Long: Maintain a healthy mental state    Continue Psychosocial Services  Follow up required by staff          Psychosocial Re-Evaluation:   Psychosocial Discharge (Final Psychosocial Re-Evaluation):   Vocational Rehabilitation: Provide vocational  rehab assistance to qualifying candidates.   Vocational Rehab Evaluation & Intervention:   Education: Education Goals: Education classes will be provided on a variety of topics geared  toward better understanding of heart health and risk factor modification. Participant will state understanding/return demonstration of topics presented as noted by education test scores.  Learning Barriers/Preferences:  Learning Barriers/Preferences - 07/03/24 1425       Learning Barriers/Preferences   Learning Barriers None    Learning Preferences None          General Cardiac Education Topics:  AED/CPR: - Group verbal and written instruction with the use of models to demonstrate the basic use of the AED with the basic ABC's of resuscitation.   Anatomy and Cardiac Procedures: - Group verbal and visual presentation and models provide information about basic cardiac anatomy and function. Reviews the testing methods done to diagnose heart disease and the outcomes of the test results. Describes the treatment choices: Medical Management, Angioplasty, or Coronary Bypass Surgery for treating various heart conditions including Myocardial Infarction, Angina, Valve Disease, and Cardiac Arrhythmias.  Written material given at graduation.   Medication Safety: - Group verbal and visual instruction to review commonly prescribed medications for heart and lung disease. Reviews the medication, class of the drug, and side effects. Includes the steps to properly store meds and maintain the prescription regimen.  Written material given at graduation.   Intimacy: - Group verbal instruction through game format to discuss how heart and lung disease can affect sexual intimacy. Written material given at graduation..   Know Your Numbers and Heart Failure: - Group verbal and visual instruction to discuss disease risk factors for cardiac and pulmonary disease and treatment options.  Reviews associated critical values for Overweight/Obesity, Hypertension, Cholesterol, and Diabetes.  Discusses basics of heart failure: signs/symptoms and treatments.  Introduces Heart Failure Zone chart for action plan for heart  failure.  Written material given at graduation.   Infection Prevention: - Provides verbal and written material to individual with discussion of infection control including proper hand washing and proper equipment cleaning during exercise session. Flowsheet Row Cardiac Rehab from 07/09/2024 in [facility] Cardiac and Pulmonary Rehab  Date 07/09/24  Educator [facility]  Instruction Review Code 1- Verbalizes Understanding    Falls Prevention: - Provides verbal and written material to individual with discussion of falls prevention and safety. Flowsheet Row Cardiac Rehab from 07/09/2024 in [facility] Cardiac and Pulmonary Rehab  Date 07/09/24  Educator [facility]  Instruction Review Code 1- Verbalizes Understanding    Other: -Provides group and verbal instruction on various topics (see comments)   Knowledge Questionnaire Score:   Core Components/Risk Factors/Patient Goals at Admission:  Personal Goals and Risk Factors at Admission - 07/03/24 1425       Core Components/Risk Factors/Patient Goals on Admission    Weight Management Yes;Weight Loss    Intervention Weight Management: Develop a combined nutrition and exercise program designed to reach desired caloric intake, while maintaining appropriate intake of nutrient and fiber, sodium and fats, and appropriate energy expenditure required for the weight goal.;Weight Management: Provide education and appropriate resources to help participant work on and attain dietary goals.;Weight Management/Obesity: Establish reasonable short term and long term weight goals.    Expected Outcomes Short Term: Continue to assess and modify interventions until short term weight is achieved;Weight Loss: Understanding of general recommendations for a balanced deficit meal plan, which promotes 1-2 lb weight loss per week and includes a negative energy balance of [phone] kcal/d;Understanding recommendations for meals to  include 15-35% energy as protein, 25-35% energy from fat, 35-60% energy  from carbohydrates, less than 200mg  of dietary cholesterol, 20-35 gm of total fiber daily;Understanding of distribution of calorie intake throughout the day with the consumption of 4-5 meals/snacks    Tobacco Cessation Yes    Number of packs per day .20    Intervention Assist the participant in steps to quit. Provide individualized education and counseling about committing to Tobacco Cessation, relapse prevention, and pharmacological support that can be provided by physician.;Education officer, environmental, assist with locating and accessing local/national Quit Smoking programs, and support quit date choice.    Expected Outcomes Short Term: Will quit all tobacco product use, adhering to prevention of relapse plan.;Short Term: Will demonstrate readiness to quit, by selecting a quit date.;Long Term: Complete abstinence from all tobacco products for at least 12 months from quit date.    Heart Failure Yes    Intervention Provide a combined exercise and nutrition program that is supplemented with education, support and counseling about heart failure. Directed toward relieving symptoms such as shortness of breath, decreased exercise tolerance, and extremity edema.    Expected Outcomes Improve functional capacity of life;Short term: Attendance in program 2-3 days a week with increased exercise capacity. Reported lower sodium intake. Reported increased fruit and vegetable intake. Reports medication compliance.;Short term: Daily weights obtained and reported for increase. Utilizing diuretic protocols set by physician.;Long term: Adoption of self-care skills and reduction of barriers for early signs and symptoms recognition and intervention leading to self-care maintenance.    Hypertension Yes    Intervention Provide education on lifestyle modifcations including regular physical activity/exercise, weight management, moderate sodium restriction and increased consumption of fresh fruit, vegetables, and low fat dairy,  alcohol  moderation, and smoking cessation.;Monitor prescription use compliance.    Expected Outcomes Short Term: Continued assessment and intervention until BP is < 140/58mm HG in hypertensive participants. < 130/74mm HG in hypertensive participants with diabetes, heart failure or chronic kidney disease.;Long Term: Maintenance of blood pressure at goal levels.    Lipids Yes    Intervention Provide education and support for participant on nutrition & aerobic/resistive exercise along with prescribed medications to achieve LDL 70mg , HDL >40mg .    Expected Outcomes Short Term: Participant states understanding of desired cholesterol values and is compliant with medications prescribed. Participant is following exercise prescription and nutrition guidelines.;Long Term: Cholesterol controlled with medications as prescribed, with individualized exercise RX and with personalized nutrition plan. Value goals: LDL < 70mg , HDL > 40 mg.          Education:Diabetes - Individual verbal and written instruction to review signs/symptoms of diabetes, desired ranges of glucose level fasting, after meals and with exercise. Acknowledge that pre and post exercise glucose checks will be done for 3 sessions at entry of program.   Core Components/Risk Factors/Patient Goals Review:    Core Components/Risk Factors/Patient Goals at Discharge (Final Review):    ITP Comments:  ITP Comments     Row Name 07/03/24 1432 07/09/24 1503         ITP Comments Virtual Visit completed. Patient informed on EP and RD appointment and 6 Minute walk test. Patient also informed of patient health questionnaires on My Chart. Patient Verbalizes understanding. Visit diagnosis can be found in [facility] 06/13/2024. Completed and gym orientation for cardiac rehab. Initial ITP created and sent for review to Dr. Oneil Pinal, Medical Director.         Comments: Initial ITP

## 2024-07-09 NOTE — Patient Instructions (Signed)
 Patient Instructions  Patient Details  Name: Stacey Sandoval MRN: [id number] Date of Birth: 1951-11-19 Referring Provider:  Perla Evalene PARAS, MD  Below are your personal goals for exercise, nutrition, and risk factors. Our goal is to help you stay on track towards obtaining and maintaining these goals. We will be discussing your progress on these goals with you throughout the program.  Initial Exercise Prescription:  Initial Exercise Prescription - 07/09/24 1500       Date of Initial Exercise RX and Referring Provider   Date 07/09/24    Referring Provider Dr. Timothy Gollan      Oxygen    Oxygen  Continuous    Liters 2L    Maintain Oxygen  Saturation 88% or higher      Recumbant Bike   Level 1    RPM 50    Watts 25    Minutes 15    METs 2      NuStep   Level 1    SPM 80    Minutes 15    METs 2      Arm Ergometer   Level 1    Watts 25    RPM 25    Minutes 15    METs 2      Track   Laps 15    Minutes 15    METs 1.82      Prescription Details   Duration Progress to 30 minutes of continuous aerobic without signs/symptoms of physical distress      Intensity   THRR 40-80% of Max Heartrate 101-130    Ratings of Perceived Exertion 11-13    Perceived Dyspnea 0-4      Progression   Progression Continue to progress workloads to maintain intensity without signs/symptoms of physical distress.      Resistance Training   Training Prescription Yes    Weight 3lb    Reps 10-15          Exercise Goals: Frequency: Be able to perform aerobic exercise two to three times per week in program working toward 2-5 days per week of home exercise.  Intensity: Work with a perceived exertion of 11 (fairly light) - 15 (hard) while following your exercise prescription.  We will make changes to your prescription with you as you progress through the program.   Duration: Be able to do 30 to 45 minutes of continuous aerobic exercise in addition to a 5 minute warm-up and a 5 minute  cool-down routine.   Nutrition Goals: Your personal nutrition goals will be established when you do your nutrition analysis with the dietician.  The following are general nutrition guidelines to follow: Cholesterol < 200mg /day Sodium < 1500mg /day Fiber: Women over 50 yrs - 21 grams per day  Personal Goals:  Personal Goals and Risk Factors at Admission - 07/03/24 1425       Core Components/Risk Factors/Patient Goals on Admission    Weight Management Yes;Weight Loss    Intervention Weight Management: Develop a combined nutrition and exercise program designed to reach desired caloric intake, while maintaining appropriate intake of nutrient and fiber, sodium and fats, and appropriate energy expenditure required for the weight goal.;Weight Management: Provide education and appropriate resources to help participant work on and attain dietary goals.;Weight Management/Obesity: Establish reasonable short term and long term weight goals.    Expected Outcomes Short Term: Continue to assess and modify interventions until short term weight is achieved;Weight Loss: Understanding of general recommendations for a balanced deficit meal plan, which promotes 1-2 lb  weight loss per week and includes a negative energy balance of [phone] kcal/d;Understanding recommendations for meals to include 15-35% energy as protein, 25-35% energy from fat, 35-60% energy from carbohydrates, less than 200mg  of dietary cholesterol, 20-35 gm of total fiber daily;Understanding of distribution of calorie intake throughout the day with the consumption of 4-5 meals/snacks    Tobacco Cessation Yes    Number of packs per day .20    Intervention Assist the participant in steps to quit. Provide individualized education and counseling about committing to Tobacco Cessation, relapse prevention, and pharmacological support that can be provided by physician.;Education officer, environmental, assist with locating and accessing local/national Quit  Smoking programs, and support quit date choice.    Expected Outcomes Short Term: Will quit all tobacco product use, adhering to prevention of relapse plan.;Short Term: Will demonstrate readiness to quit, by selecting a quit date.;Long Term: Complete abstinence from all tobacco products for at least 12 months from quit date.    Heart Failure Yes    Intervention Provide a combined exercise and nutrition program that is supplemented with education, support and counseling about heart failure. Directed toward relieving symptoms such as shortness of breath, decreased exercise tolerance, and extremity edema.    Expected Outcomes Improve functional capacity of life;Short term: Attendance in program 2-3 days a week with increased exercise capacity. Reported lower sodium intake. Reported increased fruit and vegetable intake. Reports medication compliance.;Short term: Daily weights obtained and reported for increase. Utilizing diuretic protocols set by physician.;Long term: Adoption of self-care skills and reduction of barriers for early signs and symptoms recognition and intervention leading to self-care maintenance.    Hypertension Yes    Intervention Provide education on lifestyle modifcations including regular physical activity/exercise, weight management, moderate sodium restriction and increased consumption of fresh fruit, vegetables, and low fat dairy, alcohol  moderation, and smoking cessation.;Monitor prescription use compliance.    Expected Outcomes Short Term: Continued assessment and intervention until BP is < 140/22mm HG in hypertensive participants. < 130/44mm HG in hypertensive participants with diabetes, heart failure or chronic kidney disease.;Long Term: Maintenance of blood pressure at goal levels.    Lipids Yes    Intervention Provide education and support for participant on nutrition & aerobic/resistive exercise along with prescribed medications to achieve LDL 70mg , HDL >40mg .    Expected Outcomes  Short Term: Participant states understanding of desired cholesterol values and is compliant with medications prescribed. Participant is following exercise prescription and nutrition guidelines.;Long Term: Cholesterol controlled with medications as prescribed, with individualized exercise RX and with personalized nutrition plan. Value goals: LDL < 70mg , HDL > 40 mg.          Tobacco Use Initial Evaluation: Social History   Tobacco Use  Smoking Status Every Day   Current packs/day: 0.25   Average packs/day: 0.3 packs/day for 40.0 years (10.0 ttl pk-yrs)   Types: Cigarettes  Smokeless Tobacco Never  Tobacco Comments   0.5PPD 05/31/2023    Exercise Goals and Review:  Exercise Goals     Row Name 07/09/24 1509             Exercise Goals   Increase Physical Activity Yes       Intervention Provide advice, education, support and counseling about physical activity/exercise needs.;Develop an individualized exercise prescription for aerobic and resistive training based on initial evaluation findings, risk stratification, comorbidities and participant's personal goals.       Expected Outcomes Short Term: Attend rehab on a regular basis to increase amount of physical activity.;Long  Term: Add in home exercise to make exercise part of routine and to increase amount of physical activity.;Long Term: Exercising regularly at least 3-5 days a week.       Increase Strength and Stamina Yes       Intervention Provide advice, education, support and counseling about physical activity/exercise needs.;Develop an individualized exercise prescription for aerobic and resistive training based on initial evaluation findings, risk stratification, comorbidities and participant's personal goals.       Expected Outcomes Short Term: Increase workloads from initial exercise prescription for resistance, speed, and METs.;Short Term: Perform resistance training exercises routinely during rehab and add in resistance training  at home;Long Term: Improve cardiorespiratory fitness, muscular endurance and strength as measured by increased METs and functional capacity ( )       Able to understand and use rate of perceived exertion (RPE) scale Yes       Intervention Provide education and explanation on how to use RPE scale       Expected Outcomes Long Term:  Able to use RPE to guide intensity level when exercising independently;Short Term: Able to use RPE daily in rehab to express subjective intensity level       Able to understand and use Dyspnea scale Yes       Intervention Provide education and explanation on how to use Dyspnea scale       Expected Outcomes Long Term: Able to use Dyspnea scale to guide intensity level when exercising independently;Short Term: Able to use Dyspnea scale daily in rehab to express subjective sense of shortness of breath during exertion       Knowledge and understanding of Target Heart Rate Range (THRR) Yes       Intervention Provide education and explanation of THRR including how the numbers were predicted and where they are located for reference       Expected Outcomes Short Term: Able to state/look up THRR;Short Term: Able to use daily as guideline for intensity in rehab;Long Term: Able to use THRR to govern intensity when exercising independently       Able to check pulse independently Yes       Intervention Provide education and demonstration on how to check pulse in carotid and radial arteries.;Review the importance of being able to check your own pulse for safety during independent exercise       Expected Outcomes Short Term: Able to explain why pulse checking is important during independent exercise;Long Term: Able to check pulse independently and accurately       Understanding of Exercise Prescription Yes       Intervention Provide education, explanation, and written materials on patient's individual exercise prescription       Expected Outcomes Short Term: Able to explain program  exercise prescription;Long Term: Able to explain home exercise prescription to exercise independently          Copy of goals given to participant.

## 2024-07-10 DIAGNOSIS — Z955 Presence of coronary angioplasty implant and graft: Secondary | ICD-10-CM

## 2024-07-10 DIAGNOSIS — I214 Non-ST elevation (NSTEMI) myocardial infarction: Secondary | ICD-10-CM

## 2024-07-10 NOTE — Progress Notes (Signed)
 Cardiac Individual Treatment Plan  Patient Details  Name: Stacey Sandoval MRN: [id number] Date of Birth: 10-21-1951 Referring Provider:   Flowsheet Row Cardiac Rehab from 07/09/2024 in [facility] Cardiac and Pulmonary Rehab  Referring Provider Dr. Evalene Lunger    Initial Encounter Date:  Flowsheet Row Cardiac Rehab from 07/09/2024 in [facility] Cardiac and Pulmonary Rehab  Date 07/09/24    Visit Diagnosis: NSTEMI (non-ST elevated myocardial infarction) [facility])  Status post coronary artery stent placement  Patient's Home Medications on Admission:  Current Outpatient Medications:    acetaminophen  (TYLENOL ) 650 MG CR tablet, Take 1,300 mg by mouth 2 (two) times daily., Disp: , Rfl:    albuterol  (PROVENTIL ) (2.5 MG/3ML) 0.083% nebulizer solution, Take 3 mLs (2.5 mg total) by nebulization every 6 (six) hours as needed for wheezing or shortness of breath., Disp: 75 mL, Rfl: 2   apixaban  (ELIQUIS ) 5 MG TABS tablet, Take 1 tablet (5 mg total) by mouth 2 (two) times daily., Disp: , Rfl:    atorvastatin  (LIPITOR) 40 MG tablet, Take 1 tablet (40 mg total) by mouth daily., Disp: , Rfl:    bisoprolol  (ZEBETA ) 5 MG tablet, TAKE 1 TABLET BY MOUTH EVERY DAY, Disp: 90 tablet, Rfl: 3   Budeson-Glycopyrrol-Formoterol  (BREZTRI  AEROSPHERE) 160-9-4.8 MCG/ACT AERO, Inhale 2 puffs into the lungs in the morning and at bedtime., Disp: 5.9 g, Rfl: 0   calcitRIOL (ROCALTROL) 0.25 MCG capsule, Take 0.25 mcg by mouth 2 (two) times daily., Disp: , Rfl:    clopidogrel  (PLAVIX ) 75 MG tablet, Take 1 tablet (75 mg total) by mouth daily., Disp: 90 tablet, Rfl: 2   dapagliflozin  propanediol (FARXIGA ) 10 MG TABS tablet, Take 1 tablet (10 mg total) by mouth daily., Disp: 90 tablet, Rfl: 1   diltiazem  (CARDIZEM  CD) 180 MG 24 hr capsule, TAKE 1 CAPSULE(180 MG) BY MOUTH DAILY, Disp: 90 capsule, Rfl: 2   diltiazem  (CARDIZEM ) 30 MG tablet, Take 1 tablet (30 mg total) by mouth as needed (atrial fibrillation)., Disp: 30 tablet, Rfl: 3    Ferrous Sulfate  (IRON ) 325 (65 Fe) MG TABS, Take 1 tablet (325 mg total) by mouth in the morning and at bedtime., Disp: 60 tablet, Rfl: 0   furosemide  (LASIX ) 20 MG tablet, Take 1 tablet (20 mg total) by mouth daily as needed., Disp: 30 tablet, Rfl: 2   gabapentin  (NEURONTIN ) 300 MG capsule, Take 300 mg every am & 600 in the pm, Disp: , Rfl:    levothyroxine (SYNTHROID) 100 MCG tablet, Take 100 mcg by mouth daily before breakfast., Disp: , Rfl:    mirtazapine  (REMERON ) 45 MG tablet, Take 45 mg by mouth at bedtime., Disp: , Rfl:    nitroGLYCERIN  (NITROSTAT ) 0.4 MG SL tablet, Place 1 tablet (0.4 mg total) under the tongue every 5 (five) minutes x 3 doses as needed for chest pain., Disp: 25 tablet, Rfl: 1   OXYGEN , Inhale into the lungs as needed. 2-3 Liters, Disp: , Rfl:    pantoprazole  (PROTONIX ) 40 MG tablet, Take 1 tablet (40 mg total) by mouth 2 (two) times daily before a meal for 30 days, THEN 1 tablet (40 mg total) daily., Disp: 90 tablet, Rfl: 0   potassium chloride  SA (KLOR-CON  M) 20 MEQ tablet, Take 20 mEq by mouth 4 (four) times daily as needed (prn)., Disp: , Rfl:    spironolactone  (ALDACTONE ) 25 MG tablet, Take 1 tablet (25 mg total) by mouth daily., Disp: , Rfl:    triamcinolone ointment (KENALOG) 0.5 %, Apply 1 Application topically 2 (two)  times daily., Disp: , Rfl:   Past Medical History: Past Medical History:  Diagnosis Date   Atrial fibrillation (HCC)    CHF (congestive heart failure) (HCC)    Complication of anesthesia    VERY slow to wake after hysterectomy   COPD (chronic obstructive pulmonary disease) (HCC)    Enlarged thyroid     has been referred to [facility]   Hyperlipidemia    Hypertension    Smokers' cough (HCC)    Tobacco use    Wears dentures    Full upper and lower    Tobacco Use: Social History   Tobacco Use  Smoking Status Every Day   Current packs/day: 0.25   Average packs/day: 0.3 packs/day for 40.0 years (10.0 ttl pk-yrs)   Types: Cigarettes  Smokeless  Tobacco Never  Tobacco Comments   0.5PPD 05/31/2023    Labs: Review Flowsheet       Latest Ref Rng & Units 03/08/2022 06/14/2024 06/15/2024 06/18/2024  Labs for ITP Cardiac and Pulmonary Rehab  Cholestrol 0 - 200 mg/dL - 889  - -  LDL (calc) 0 - 99 mg/dL - 49  - -  HDL-C >59 mg/dL - 49  - -  Trlycerides <150 mg/dL - 60  - -  PH, Arterial 7.35 - 7.45 7.31  - - 7.298  7.291   PCO2 arterial 32 - 48 mmHg 65  - - 72.7  73.7   Bicarbonate 20.0 - 28.0 mmol/L 32.7  - 38.8  34.1  35.6  35.5   TCO2 22 - 32 mmol/L - - - 38  38   O2 Saturation % 100  - 73.6  93.4  78  62     Details       Multiple values from one day are sorted in reverse-chronological order          Exercise Target Goals: Exercise Program Goal: Individual exercise prescription set using results from initial 6 min walk test and THRR while considering  patient's activity barriers and safety.   Exercise Prescription Goal: Initial exercise prescription builds to 30-45 minutes a day of aerobic activity, 2-3 days per week.  Home exercise guidelines will be given to patient during program as part of exercise prescription that the participant will acknowledge.   Education: Aerobic Exercise: - Group verbal and visual presentation on the components of exercise prescription. Introduces F.I.T.T principle from ACSM for exercise prescriptions.  Reviews F.I.T.T. principles of aerobic exercise including progression. Written material given at graduation.   Education: Resistance Exercise: - Group verbal and visual presentation on the components of exercise prescription. Introduces F.I.T.T principle from ACSM for exercise prescriptions  Reviews F.I.T.T. principles of resistance exercise including progression. Written material given at graduation.    Education: Exercise & Equipment Safety: - Individual verbal instruction and demonstration of equipment use and safety with use of the equipment. Flowsheet Row Cardiac Rehab from 07/09/2024 in  [facility] Cardiac and Pulmonary Rehab  Date 07/09/24  Educator [facility]  Instruction Review Code 1- Verbalizes Understanding    Education: Exercise Physiology & General Exercise Guidelines: - Group verbal and written instruction with models to review the exercise physiology of the cardiovascular system and associated critical values. Provides general exercise guidelines with specific guidelines to those with heart or lung disease.    Education: Flexibility, Balance, Mind/Body Relaxation: - Group verbal and visual presentation with interactive activity on the components of exercise prescription. Introduces F.I.T.T principle from ACSM for exercise prescriptions. Reviews F.I.T.T. principles of flexibility and balance exercise training  including progression. Also discusses the mind body connection.  Reviews various relaxation techniques to help reduce and manage stress (i.e. Deep breathing, progressive muscle relaxation, and visualization). Balance handout provided to take home. Written material given at graduation.   Activity Barriers & Risk Stratification:  Activity Barriers & Cardiac Risk Stratification - 07/09/24 1505       Activity Barriers & Cardiac Risk Stratification   Activity Barriers Arthritis;Back Problems;Deconditioning;Shortness of Breath;Assistive Device    Cardiac Risk Stratification Moderate          6 Minute Walk:  6 Minute Walk     Row Name 07/09/24 1503         6 Minute Walk   Phase Initial     Distance 570 feet     Walk Time 6 minutes     # of Rest Breaks 0     MPH 1.08     METS 2     RPE 13     Perceived Dyspnea  2     VO2 Peak 3.6     Symptoms Yes (comment)     Comments Right hip pain, hamstring discomfort     Resting HR 73 bpm     Resting BP 110/58     Resting Oxygen  Saturation  96 %     Exercise Oxygen  Saturation  during 6 min walk 98 %     Max Ex. HR 94 bpm     Max Ex. BP 126/60     2 Minute Post BP 118/58        Oxygen  Initial Assessment:   Oxygen   Re-Evaluation:   Oxygen  Discharge (Final Oxygen  Re-Evaluation):   Initial Exercise Prescription:  Initial Exercise Prescription - 07/09/24 1500       Date of Initial Exercise RX and Referring Provider   Date 07/09/24    Referring Provider Dr. Timothy Gollan      Oxygen    Oxygen  Continuous    Liters 2L    Maintain Oxygen  Saturation 88% or higher      Recumbant Bike   Level 1    RPM 50    Watts 25    Minutes 15    METs 2      NuStep   Level 1    SPM 80    Minutes 15    METs 2      Arm Ergometer   Level 1    Watts 25    RPM 25    Minutes 15    METs 2      Track   Laps 15    Minutes 15    METs 1.82      Prescription Details   Duration Progress to 30 minutes of continuous aerobic without signs/symptoms of physical distress      Intensity   THRR 40-80% of Max Heartrate 101-130    Ratings of Perceived Exertion 11-13    Perceived Dyspnea 0-4      Progression   Progression Continue to progress workloads to maintain intensity without signs/symptoms of physical distress.      Resistance Training   Training Prescription Yes    Weight 3lb    Reps 10-15          Perform Capillary Blood Glucose checks as needed.  Exercise Prescription Changes:   Exercise Prescription Changes     Row Name 07/09/24 1500             Response to Exercise   Blood Pressure (Admit) 110/58  Blood Pressure (Exercise) 126/60       Blood Pressure (Exit) 118/58       Heart Rate (Admit) 73 bpm       Heart Rate (Exercise) 94 bpm       Heart Rate (Exit) 75 bpm       Oxygen  Saturation (Admit) 96 %       Oxygen  Saturation (Exercise) 98 %       Oxygen  Saturation (Exit) 98 %       Rating of Perceived Exertion (Exercise) 13       Perceived Dyspnea (Exercise) 2       Symptoms bilateral hip pain, hamstring discomfort       Comments results          Exercise Comments:   Exercise Goals and Review:   Exercise Goals     Row Name 07/09/24 1509              Exercise Goals   Increase Physical Activity Yes       Intervention Provide advice, education, support and counseling about physical activity/exercise needs.;Develop an individualized exercise prescription for aerobic and resistive training based on initial evaluation findings, risk stratification, comorbidities and participant's personal goals.       Expected Outcomes Short Term: Attend rehab on a regular basis to increase amount of physical activity.;Long Term: Add in home exercise to make exercise part of routine and to increase amount of physical activity.;Long Term: Exercising regularly at least 3-5 days a week.       Increase Strength and Stamina Yes       Intervention Provide advice, education, support and counseling about physical activity/exercise needs.;Develop an individualized exercise prescription for aerobic and resistive training based on initial evaluation findings, risk stratification, comorbidities and participant's personal goals.       Expected Outcomes Short Term: Increase workloads from initial exercise prescription for resistance, speed, and METs.;Short Term: Perform resistance training exercises routinely during rehab and add in resistance training at home;Long Term: Improve cardiorespiratory fitness, muscular endurance and strength as measured by increased METs and functional capacity ( )       Able to understand and use rate of perceived exertion (RPE) scale Yes       Intervention Provide education and explanation on how to use RPE scale       Expected Outcomes Long Term:  Able to use RPE to guide intensity level when exercising independently;Short Term: Able to use RPE daily in rehab to express subjective intensity level       Able to understand and use Dyspnea scale Yes       Intervention Provide education and explanation on how to use Dyspnea scale       Expected Outcomes Long Term: Able to use Dyspnea scale to guide intensity level when exercising independently;Short Term:  Able to use Dyspnea scale daily in rehab to express subjective sense of shortness of breath during exertion       Knowledge and understanding of Target Heart Rate Range (THRR) Yes       Intervention Provide education and explanation of THRR including how the numbers were predicted and where they are located for reference       Expected Outcomes Short Term: Able to state/look up THRR;Short Term: Able to use daily as guideline for intensity in rehab;Long Term: Able to use THRR to govern intensity when exercising independently       Able to check pulse independently Yes  Intervention Provide education and demonstration on how to check pulse in carotid and radial arteries.;Review the importance of being able to check your own pulse for safety during independent exercise       Expected Outcomes Short Term: Able to explain why pulse checking is important during independent exercise;Long Term: Able to check pulse independently and accurately       Understanding of Exercise Prescription Yes       Intervention Provide education, explanation, and written materials on patient's individual exercise prescription       Expected Outcomes Short Term: Able to explain program exercise prescription;Long Term: Able to explain home exercise prescription to exercise independently          Exercise Goals Re-Evaluation :   Discharge Exercise Prescription (Final Exercise Prescription Changes):  Exercise Prescription Changes - 07/09/24 1500       Response to Exercise   Blood Pressure (Admit) 110/58    Blood Pressure (Exercise) 126/60    Blood Pressure (Exit) 118/58    Heart Rate (Admit) 73 bpm    Heart Rate (Exercise) 94 bpm    Heart Rate (Exit) 75 bpm    Oxygen  Saturation (Admit) 96 %    Oxygen  Saturation (Exercise) 98 %    Oxygen  Saturation (Exit) 98 %    Rating of Perceived Exertion (Exercise) 13    Perceived Dyspnea (Exercise) 2    Symptoms bilateral hip pain, hamstring discomfort    Comments  results          Nutrition:  Target Goals: Understanding of nutrition guidelines, daily intake of sodium 1500mg , cholesterol 200mg , calories 30% from fat and 7% or less from saturated fats, daily to have 5 or more servings of fruits and vegetables.  Education: All About Nutrition: -Group instruction provided by verbal, written material, interactive activities, discussions, models, and posters to present general guidelines for heart healthy nutrition including fat, fiber, MyPlate, the role of sodium in heart healthy nutrition, utilization of the nutrition label, and utilization of this knowledge for meal planning. Follow up email sent as well. Written material given at graduation.   Biometrics:  Pre Biometrics - 07/09/24 1509       Pre Biometrics   Height 4' 11.8 (1.519 m)    Weight 149 lb 8 oz (67.8 kg)    Waist Circumference 38 inches    Hip Circumference 40.5 inches    Waist to Hip Ratio 0.94 %    BMI (Calculated) 29.39    Single Leg Stand 6.81 seconds           Nutrition Therapy Plan and Nutrition Goals:   Nutrition Assessments:  MEDIFICTS Score Key: >=70 Need to make dietary changes  40-70 Heart Healthy Diet <= 40 Therapeutic Level Cholesterol Diet   Picture Your Plate Scores: <59 Unhealthy dietary pattern with much room for improvement. 41-50 Dietary pattern unlikely to meet recommendations for good health and room for improvement. 51-60 More healthful dietary pattern, with some room for improvement.  >60 Healthy dietary pattern, although there may be some specific behaviors that could be improved.    Nutrition Goals Re-Evaluation:   Nutrition Goals Discharge (Final Nutrition Goals Re-Evaluation):   Psychosocial: Target Goals: Acknowledge presence or absence of significant depression and/or stress, maximize coping skills, provide positive support system. Participant is able to verbalize types and ability to use techniques and skills needed for reducing  stress and depression.   Education: Stress, Anxiety, and Depression - Group verbal and visual presentation to define topics covered.  Reviews how body is impacted by stress, anxiety, and depression.  Also discusses healthy ways to reduce stress and to treat/manage anxiety and depression.  Written material given at graduation.   Education: Sleep Hygiene -Provides group verbal and written instruction about how sleep can affect your health.  Define sleep hygiene, discuss sleep cycles and impact of sleep habits. Review good sleep hygiene tips.    Initial Review & Psychosocial Screening:  Initial Psych Review & Screening - 07/03/24 1428       Initial Review   Current issues with None Identified      Family Dynamics   Good Support System? Yes    Comments Laruth states she can look to her daughter Luke for support. She denies any mental instability and takes no medications for her mood.      Barriers   Psychosocial barriers to participate in program The patient should benefit from training in stress management and relaxation.;There are no identifiable barriers or psychosocial needs.      Screening Interventions   Interventions Provide feedback about the scores to participant;To provide support and resources with identified psychosocial needs;Encouraged to exercise    Expected Outcomes Short Term goal: Utilizing psychosocial counselor, staff and physician to assist with identification of specific Stressors or current issues interfering with healing process. Setting desired goal for each stressor or current issue identified.;Long Term Goal: Stressors or current issues are controlled or eliminated.;Short Term goal: Identification and review with participant of any Quality of Life or Depression concerns found by scoring the questionnaire.;Long Term goal: The participant improves quality of Life and PHQ9 Scores as seen by post scores and/or verbalization of changes          Quality of Life Scores:    Scores of 19 and below usually indicate a poorer quality of life in these areas.  A difference of  2-3 points is a clinically meaningful difference.  A difference of 2-3 points in the total score of the Quality of Life Index has been associated with significant improvement in overall quality of life, self-image, physical symptoms, and general health in studies assessing change in quality of life.  PHQ-9: Review Flowsheet       07/09/2024  Depression screen PHQ 2/9  Decreased Interest 0  Down, Depressed, Hopeless 1  PHQ - 2 Score 1  Altered sleeping 1  Tired, decreased energy 1  Change in appetite 0  Feeling bad or failure about yourself  0  Trouble concentrating 0  Moving slowly or fidgety/restless 2  Suicidal thoughts 0  PHQ-9 Score 5  Difficult doing work/chores Not difficult at all   Interpretation of Total Score  Total Score Depression Severity:  1-4 = Minimal depression, 5-9 = Mild depression, 10-14 = Moderate depression, 15-19 = Moderately severe depression, 20-27 = Severe depression   Psychosocial Evaluation and Intervention:  Psychosocial Evaluation - 07/03/24 1433       Psychosocial Evaluation & Interventions   Interventions Encouraged to exercise with the program and follow exercise prescription;Relaxation education;Stress management education    Comments Tersea states she can look to her daughter Luke for support. She denies any mental instability and takes no medications for her mood.    Expected Outcomes Short: Start [facility] to help with mood. Long: Maintain a healthy mental state    Continue Psychosocial Services  Follow up required by staff          Psychosocial Re-Evaluation:   Psychosocial Discharge (Final Psychosocial Re-Evaluation):   Vocational Rehabilitation: Provide vocational  rehab assistance to qualifying candidates.   Vocational Rehab Evaluation & Intervention:   Education: Education Goals: Education classes will be provided on a  variety of topics geared toward better understanding of heart health and risk factor modification. Participant will state understanding/return demonstration of topics presented as noted by education test scores.  Learning Barriers/Preferences:  Learning Barriers/Preferences - 07/03/24 1425       Learning Barriers/Preferences   Learning Barriers None    Learning Preferences None          General Cardiac Education Topics:  AED/CPR: - Group verbal and written instruction with the use of models to demonstrate the basic use of the AED with the basic ABC's of resuscitation.   Anatomy and Cardiac Procedures: - Group verbal and visual presentation and models provide information about basic cardiac anatomy and function. Reviews the testing methods done to diagnose heart disease and the outcomes of the test results. Describes the treatment choices: Medical Management, Angioplasty, or Coronary Bypass Surgery for treating various heart conditions including Myocardial Infarction, Angina, Valve Disease, and Cardiac Arrhythmias.  Written material given at graduation.   Medication Safety: - Group verbal and visual instruction to review commonly prescribed medications for heart and lung disease. Reviews the medication, class of the drug, and side effects. Includes the steps to properly store meds and maintain the prescription regimen.  Written material given at graduation.   Intimacy: - Group verbal instruction through game format to discuss how heart and lung disease can affect sexual intimacy. Written material given at graduation..   Know Your Numbers and Heart Failure: - Group verbal and visual instruction to discuss disease risk factors for cardiac and pulmonary disease and treatment options.  Reviews associated critical values for Overweight/Obesity, Hypertension, Cholesterol, and Diabetes.  Discusses basics of heart failure: signs/symptoms and treatments.  Introduces Heart Failure Zone chart  for action plan for heart failure.  Written material given at graduation.   Infection Prevention: - Provides verbal and written material to individual with discussion of infection control including proper hand washing and proper equipment cleaning during exercise session. Flowsheet Row Cardiac Rehab from 07/09/2024 in [facility] Cardiac and Pulmonary Rehab  Date 07/09/24  Educator [facility]  Instruction Review Code 1- Verbalizes Understanding    Falls Prevention: - Provides verbal and written material to individual with discussion of falls prevention and safety. Flowsheet Row Cardiac Rehab from 07/09/2024 in [facility] Cardiac and Pulmonary Rehab  Date 07/09/24  Educator [facility]  Instruction Review Code 1- Verbalizes Understanding    Other: -Provides group and verbal instruction on various topics (see comments)   Knowledge Questionnaire Score:   Core Components/Risk Factors/Patient Goals at Admission:  Personal Goals and Risk Factors at Admission - 07/03/24 1425       Core Components/Risk Factors/Patient Goals on Admission    Weight Management Yes;Weight Loss    Intervention Weight Management: Develop a combined nutrition and exercise program designed to reach desired caloric intake, while maintaining appropriate intake of nutrient and fiber, sodium and fats, and appropriate energy expenditure required for the weight goal.;Weight Management: Provide education and appropriate resources to help participant work on and attain dietary goals.;Weight Management/Obesity: Establish reasonable short term and long term weight goals.    Expected Outcomes Short Term: Continue to assess and modify interventions until short term weight is achieved;Weight Loss: Understanding of general recommendations for a balanced deficit meal plan, which promotes 1-2 lb weight loss per week and includes a negative energy balance of [phone] kcal/d;Understanding recommendations for meals to  include 15-35% energy as protein, 25-35% energy  from fat, 35-60% energy from carbohydrates, less than 200mg  of dietary cholesterol, 20-35 gm of total fiber daily;Understanding of distribution of calorie intake throughout the day with the consumption of 4-5 meals/snacks    Tobacco Cessation Yes    Number of packs per day .20    Intervention Assist the participant in steps to quit. Provide individualized education and counseling about committing to Tobacco Cessation, relapse prevention, and pharmacological support that can be provided by physician.;Education officer, environmental, assist with locating and accessing local/national Quit Smoking programs, and support quit date choice.    Expected Outcomes Short Term: Will quit all tobacco product use, adhering to prevention of relapse plan.;Short Term: Will demonstrate readiness to quit, by selecting a quit date.;Long Term: Complete abstinence from all tobacco products for at least 12 months from quit date.    Heart Failure Yes    Intervention Provide a combined exercise and nutrition program that is supplemented with education, support and counseling about heart failure. Directed toward relieving symptoms such as shortness of breath, decreased exercise tolerance, and extremity edema.    Expected Outcomes Improve functional capacity of life;Short term: Attendance in program 2-3 days a week with increased exercise capacity. Reported lower sodium intake. Reported increased fruit and vegetable intake. Reports medication compliance.;Short term: Daily weights obtained and reported for increase. Utilizing diuretic protocols set by physician.;Long term: Adoption of self-care skills and reduction of barriers for early signs and symptoms recognition and intervention leading to self-care maintenance.    Hypertension Yes    Intervention Provide education on lifestyle modifcations including regular physical activity/exercise, weight management, moderate sodium restriction and increased consumption of fresh fruit,  vegetables, and low fat dairy, alcohol  moderation, and smoking cessation.;Monitor prescription use compliance.    Expected Outcomes Short Term: Continued assessment and intervention until BP is < 140/26mm HG in hypertensive participants. < 130/43mm HG in hypertensive participants with diabetes, heart failure or chronic kidney disease.;Long Term: Maintenance of blood pressure at goal levels.    Lipids Yes    Intervention Provide education and support for participant on nutrition & aerobic/resistive exercise along with prescribed medications to achieve LDL 70mg , HDL >40mg .    Expected Outcomes Short Term: Participant states understanding of desired cholesterol values and is compliant with medications prescribed. Participant is following exercise prescription and nutrition guidelines.;Long Term: Cholesterol controlled with medications as prescribed, with individualized exercise RX and with personalized nutrition plan. Value goals: LDL < 70mg , HDL > 40 mg.          Education:Diabetes - Individual verbal and written instruction to review signs/symptoms of diabetes, desired ranges of glucose level fasting, after meals and with exercise. Acknowledge that pre and post exercise glucose checks will be done for 3 sessions at entry of program.   Core Components/Risk Factors/Patient Goals Review:    Core Components/Risk Factors/Patient Goals at Discharge (Final Review):    ITP Comments:  ITP Comments     Row Name 07/03/24 1432 07/09/24 1503 07/10/24 0739       ITP Comments Virtual Visit completed. Patient informed on EP and RD appointment and 6 Minute walk test. Patient also informed of patient health questionnaires on My Chart. Patient Verbalizes understanding. Visit diagnosis can be found in [facility] 06/13/2024. Completed and gym orientation for cardiac rehab. Initial ITP created and sent for review to Dr. Oneil Pinal, Medical Director. 30 Day review completed. Medical Director ITP review done, changes  made as directed, and signed approval by  Medical Director. New to program.        Comments: 30 day review

## 2024-07-11 ENCOUNTER — Encounter: Payer: Self-pay | Admitting: *Deleted

## 2024-07-12 ENCOUNTER — Ambulatory Visit: Attending: Medical | Admitting: Medical

## 2024-07-12 ENCOUNTER — Ambulatory Visit: Admitting: Medical

## 2024-07-12 ENCOUNTER — Encounter: Payer: Self-pay | Admitting: Medical

## 2024-07-12 VITALS — BP 106/68 | HR 69 | Ht 59.0 in | Wt 148.4 lb

## 2024-07-12 DIAGNOSIS — I2510 Atherosclerotic heart disease of native coronary artery without angina pectoris: Secondary | ICD-10-CM

## 2024-07-12 DIAGNOSIS — Z79899 Other long term (current) drug therapy: Secondary | ICD-10-CM

## 2024-07-12 DIAGNOSIS — I214 Non-ST elevation (NSTEMI) myocardial infarction: Secondary | ICD-10-CM

## 2024-07-12 DIAGNOSIS — I480 Paroxysmal atrial fibrillation: Secondary | ICD-10-CM

## 2024-07-12 DIAGNOSIS — I5032 Chronic diastolic (congestive) heart failure: Secondary | ICD-10-CM

## 2024-07-12 DIAGNOSIS — K922 Gastrointestinal hemorrhage, unspecified: Secondary | ICD-10-CM | POA: Diagnosis not present

## 2024-07-12 NOTE — Patient Instructions (Signed)
 Medication Instructions:  Your physician recommends that you continue on your current medications as directed. Please refer to the Current Medication list given to you today.    *If you need a refill on your cardiac medications before your next appointment, please call your pharmacy*  Lab Work: Your provider would like for you to have following labs drawn today CBC.     Testing/Procedures: No test ordered today   Follow-Up: At [facility], you and your health needs are our priority.  As part of our continuing mission to provide you with exceptional heart care, our providers are all part of one team.  This team includes your primary Cardiologist (physician) and Advanced Practice Providers or APPs (Physician Assistants and Nurse Practitioners) who all work together to provide you with the care you need, when you need it.  Your next appointment:   2 month(s)  Provider:   Timothy Gollan, MD or Cadence Franchester, PA-C

## 2024-07-12 NOTE — Progress Notes (Signed)
 Cardiology Office Note   Date:  07/12/2024  ID:  Stacey Sandoval, DOB November 24, 1951, MRN [id number] PCP: [facility] Providers Cardiologist:  Evalene Lunger, MD    History of Present Illness Stacey Sandoval is a 73 y.o. female with a history of paroxysmal A-fib, COPD, chronic HFpEF, current smoker who is being seen for hospital follow-up.  Patient was admitted to [facility] 06/12/2024 with chest pain.  High-sensitivity troponin trended up to the 400s.  Echocardiogram showed EF of 60 to 65%, no wall motion abnormality, mild to moderately dilated left atrium, mild MR, aortic valve sclerosis.  Left heart cath showed proximal RCA lesion 95% stenosis, heavily calcified focal lesion, proximal RCA to mid RCA lesion of 85% stenosis with 95% stenosis sidebranch in acute margin.  Moderately calcified involving a large RV marginal branch, mid RCA lesion 50% stenosis, proximal circumflex lesion 70% stenosis focal at this point will consider medical management pending completion of RCA intervention, mid LAD and distal LAD lesion 30% stenosis, and LVEDP normal.  There was no aortic valve stenosis.  Recommendations were to load with antiplatelet therapy and restart IV heparin  2 hours post TR band removal.  Was recommended patient be transferred to [facility] with plans for atherectomy based PCI to the RCA.  Patient was continued on aspirin  and Brilinta .  Patient underwent cardiac cath 7/8 with successful OTC guided CSI orbital atherectomy with DES x 2.  Recommendations with aspirin  and Plavix  and Eliquis  for 1 month with plan to drop aspirin  after 30 days.  Patient developed melena on 7 8.  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  She was started on PPI.  GI recommended resumption of Eliquis  7/14.  Today, the patient is diong OK. She is back at home and still trying to get strength back. She is on  2L O2, which is her baseline. She denies chest pain. She is doing PT at home. She will start cardiac rehab next week. She denies heart racing or fluttering. She is smoking 4 cigarettes daily. Cath site, right radial, is stable.   Studies Reviewed EKG Interpretation Date/Time:  Friday July 12 2024 14:31:44 EDT Ventricular Rate:  69 PR Interval:  170 QRS Duration:  62 QT Interval:  384 QTC Calculation: 411 R Axis:   29  Text Interpretation: Normal sinus rhythm Low voltage QRS Septal infarct , age undetermined When compared with ECG of 28-Jun-2024 15:06, Premature ventricular complexes are no longer Present Septal infarct is now Present Confirmed by Franchester Mail ([id number]) on 07/12/2024 2:34:50 PM    Cath: 06/13/2024     Prox RCA lesion is 95% stenosed.  Heavily calcified focal lesion   Prox RCA to Mid RCA lesion is 85% stenosed with 90% stenosed side branch in Acute Mrg.  Moderately calcified involving large RV marginal branch.   Mid RCA lesion is 50% stenosed.   Prox Cx lesion is 70% stenosed.  (Focal) at this point would consider medical management pending completion of the RCA intervention   Mid LAD to Dist LAD lesion is 30% stenosed.   LV end diastolic pressure is normal. There is no aortic valve stenosis.   Dominance: Right body  RECOMMENDATIONS   Plan will be to load with antiplatelet agent (likely Brilinta ), restart IV heparin  2 hours post TR band removal.   Will arrange transfer to [facility] with plans for atherectomy based  PCI to the RCA on Monday, 06/17/2024   Recommend dual antiplatelet therapy with Aspirin  81mg  daily and Ticagrelor  90mg  twice daily long-term (beyond 12 months) because of Will likely require long stent in the RCA.   Alm Clay, MD, MS   Cath: 06/18/2024     Lesion Segment #1 prox RCA to Mid RCA lesion is 85% stenosed with 70% stenosed side branch in Acute Mrg. Mid RCA lesion is 50% stenosed.   Orbital atherectomy followed by OCT performed to ensure  adequate stent coverage and expansion.   A drug-eluting stent was successfully placed covering the significant 85% lesion and the more distal 50% lesion, using a STENT SYNERGY XD 3.0X32.  Stent was postdilated to 3.3 mm. Post intervention, there is a 0% residual stenosis.  TIMI-3 flow maintained. Post intervention, the side branch was reduced to 60% residual stenosis.   Lesion #2: Prox RCA lesion is 95% stenosed.   -------------------------------------------------------------------------   Orbital atherectomy followed by OCT performed to ensure adequate stent coverage and expansion.   A drug-eluting stent was successfully placed using a STENT SYNERGY XD 4.0X28. Stent was postdilated in tapered fashion from 4.6 to 4.1 mm; Post intervention, there is a 0% residual stenosis. TIMI-3 flow maintained   -------------------------------------------------------------------------   Diagnostic               Dominance: Right                                                  Intervention             Successful OCT guided CSI orbital atherectomy based DES PCI of the ostial to distal RCA treated from significant calcified lesions and placing 2 overlapping Synergy XD DES stents: 3.0 mm x 32 mm postdilated to 3.3 mm, with a proximal overlapping 4.0 mm x 28 mm stent deployed to 4.1 mm and postdilated proximally to 4.5 mm based on OCT.  Transient hypotension during intervention requiring Levophed  that was easily weaned off upon completion of the procedure.  Baseline hypoxia and hypercapnia with oxygen  saturations in the high 80s with blood gas PCO2 in the 70s with pH roughly 7.3.    RECOMMENDATIONS   In the absence of any other complications or medical issues, we expect the patient to be ready for discharge from an interventional cardiology perspective on 06/19/2024.   Recommend to resume Apixaban , at currently prescribed dose and frequency on 06/19/2024.   Recommend concurrent antiplatelet therapy of Aspirin  81 mg for 1  month and Clopidogrel  75mg  daily for 12 months .   Will restart IV heparin  overnight tonight and then initiate DOAC on 06/19/2024   Echo 06/2024 1. Left ventricular ejection fraction, by estimation, is 60 to 65%. The  left ventricle has normal function. The left ventricle has no regional  wall motion abnormalities. There is mild left ventricular hypertrophy of  the basal-septal segment. Left  ventricular diastolic parameters are consistent with Grade II diastolic  dysfunction (pseudonormalization).   2. Right ventricular systolic function is normal. The right ventricular  size is normal.   3. Left atrial size was mild to moderately dilated.   4. The mitral valve is normal in structure. Mild mitral valve  regurgitation.   5. The aortic valve was not well visualized. Aortic valve regurgitation  is not visualized. Aortic valve sclerosis is present, with no  evidence of  aortic valve stenosis. Aortic valve mean gradient measures 7.0 mmHg.    Physical Exam VS:  BP 106/68   Pulse 69   Ht 4' 11 (1.499 m)   Wt 148 lb 6.4 oz (67.3 kg)   SpO2 97%   BMI 29.97 kg/m        Wt Readings from Last 3 Encounters:  07/12/24 148 lb 6.4 oz (67.3 kg)  07/09/24 149 lb 8 oz (67.8 kg)  06/28/24 151 lb (68.5 kg)    GEN: Well nourished, well developed in no acute distress NECK: No JVD; No carotid bruits CARDIAC: RRR, no murmurs, rubs, gallops RESPIRATORY:  Clear to auscultation without rales, wheezing or rhonchi  ABDOMEN: Soft, non-tender, non-distended EXTREMITIES:  No edema; No deformity   ASSESSMENT AND PLAN  NSTEMI Patient initially presented to [facility] with chest pain found to have non-STEMI.  Cardiac cath 7/3 showed proximal RCA lesion of 95%, heavily calcified, mid RCA lesion 85% with 95% stenosis of sidebranch and acute margin, moderately calcified large RV marginal branch, mid RCA lesion 50% with proximal circumflex lesion of 70%.  Patient was transferred to [facility] for high risk procedure.   Patient underwent cardiac cath 7/8 with successful OTC guided CSI orbital atherectomy with DES x 2.  Patient was started on triple therapy with aspirin , Plavix , Eliquis  for 1 month, then stop aspirin  after 30 days.  Hospitalization was complicated by GI bleed.  Patient underwent EGD showing single bleeding in June dysplastic lesion of the duodenum treated with APC and clip.  Eliquis  was resumed on 7/14 per GI recs.  Continue Plavix  75 mg daily and Eliquis  5 mg twice daily.  CBC today.  She has appointment with cardiac rehab next week.  Continue Lipitor 40 mg daily, bisoprolol  5 mg daily, sublingual nitro.  Acute GI bleed Patient developed melena with large black tarry bowel movement on 7/8.  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  Patient was started on PPI.  She denies any further bleeding.  CBC as above.  Continue PPI.  Paroxysmal A-fib Patient is in normal sinus rhythm.  Continue bisoprolol  5 mg daily and diltiazem  180 mg daily.  Continue Eliquis  5 mg twice daily for stroke prophylaxis.  Chronic diastolic heart failure Echo showed LVEF of 60 to 65%, no wall motion abnormality, normal RV, moderately enlarged left atrium, mild MR.  Patient was treated with IV Lasix  and discharged on Lasix  20 mg daily.  Continue Farxiga  10 mg daily and spironolactone  12.5 mg daily.  Patient is being followed by advanced heart failure clinic.  Tobacco use COPD Patient is smoking less than 1 pack a day.  She is on baseline 2 to 3 L of oxygen .       Dispo: Follow-up in 2 months  Signed, Zarriah Starkel VEAR Fishman, PA-C

## 2024-07-13 LAB — CBC
Hematocrit: 35.6 % (ref 34.0–46.6)
Hemoglobin: 10.9 g/dL — ABNORMAL LOW (ref 11.1–15.9)
MCH: 29.9 pg (ref 26.6–33.0)
MCHC: 30.6 g/dL — ABNORMAL LOW (ref 31.5–35.7)
MCV: 98 fL — ABNORMAL HIGH (ref 79–97)
Platelets: 307 x10E3/uL (ref 150–450)
RBC: 3.65 x10E6/uL — ABNORMAL LOW (ref 3.77–5.28)
RDW: 15.3 % (ref 11.7–15.4)
WBC: 5.4 x10E3/uL (ref 3.4–10.8)

## 2024-07-15 ENCOUNTER — Ambulatory Visit: Payer: Self-pay | Admitting: Medical

## 2024-07-15 ENCOUNTER — Other Ambulatory Visit: Payer: Self-pay

## 2024-07-15 ENCOUNTER — Emergency Department

## 2024-07-15 ENCOUNTER — Inpatient Hospital Stay
Admission: EM | Admit: 2024-07-15 | Discharge: 2024-07-16 | DRG: 193 | Disposition: A | Discharge status: Home / Self Care | Attending: Internal Medicine | Admitting: Internal Medicine

## 2024-07-15 DIAGNOSIS — I2510 Atherosclerotic heart disease of native coronary artery without angina pectoris: Secondary | ICD-10-CM | POA: Diagnosis present

## 2024-07-15 DIAGNOSIS — R0689 Other abnormalities of breathing: Principal | ICD-10-CM

## 2024-07-15 DIAGNOSIS — Z885 Allergy status to narcotic agent status: Secondary | ICD-10-CM | POA: Diagnosis not present

## 2024-07-15 DIAGNOSIS — Z79899 Other long term (current) drug therapy: Secondary | ICD-10-CM

## 2024-07-15 DIAGNOSIS — Z7951 Long term (current) use of inhaled steroids: Secondary | ICD-10-CM | POA: Diagnosis not present

## 2024-07-15 DIAGNOSIS — E785 Hyperlipidemia, unspecified: Secondary | ICD-10-CM | POA: Diagnosis present

## 2024-07-15 DIAGNOSIS — E039 Hypothyroidism, unspecified: Secondary | ICD-10-CM | POA: Diagnosis present

## 2024-07-15 DIAGNOSIS — Z79890 Hormone replacement therapy (postmenopausal): Secondary | ICD-10-CM | POA: Diagnosis not present

## 2024-07-15 DIAGNOSIS — J9622 Acute and chronic respiratory failure with hypercapnia: Secondary | ICD-10-CM | POA: Diagnosis present

## 2024-07-15 DIAGNOSIS — I4891 Unspecified atrial fibrillation: Secondary | ICD-10-CM | POA: Diagnosis not present

## 2024-07-15 DIAGNOSIS — J9601 Acute respiratory failure with hypoxia: Secondary | ICD-10-CM | POA: Diagnosis not present

## 2024-07-15 DIAGNOSIS — J441 Chronic obstructive pulmonary disease with (acute) exacerbation: Secondary | ICD-10-CM | POA: Diagnosis present

## 2024-07-15 DIAGNOSIS — I252 Old myocardial infarction: Secondary | ICD-10-CM

## 2024-07-15 DIAGNOSIS — Z886 Allergy status to analgesic agent status: Secondary | ICD-10-CM

## 2024-07-15 DIAGNOSIS — F17210 Nicotine dependence, cigarettes, uncomplicated: Secondary | ICD-10-CM | POA: Diagnosis present

## 2024-07-15 DIAGNOSIS — J9690 Respiratory failure, unspecified, unspecified whether with hypoxia or hypercapnia: Secondary | ICD-10-CM | POA: Diagnosis present

## 2024-07-15 DIAGNOSIS — Z888 Allergy status to other drugs, medicaments and biological substances status: Secondary | ICD-10-CM

## 2024-07-15 DIAGNOSIS — R7989 Other specified abnormal findings of blood chemistry: Secondary | ICD-10-CM

## 2024-07-15 DIAGNOSIS — I480 Paroxysmal atrial fibrillation: Secondary | ICD-10-CM | POA: Diagnosis present

## 2024-07-15 DIAGNOSIS — I5030 Unspecified diastolic (congestive) heart failure: Secondary | ICD-10-CM

## 2024-07-15 DIAGNOSIS — Z7902 Long term (current) use of antithrombotics/antiplatelets: Secondary | ICD-10-CM | POA: Diagnosis not present

## 2024-07-15 DIAGNOSIS — F419 Anxiety disorder, unspecified: Secondary | ICD-10-CM | POA: Diagnosis present

## 2024-07-15 DIAGNOSIS — Z8249 Family history of ischemic heart disease and other diseases of the circulatory system: Secondary | ICD-10-CM | POA: Diagnosis not present

## 2024-07-15 DIAGNOSIS — I5032 Chronic diastolic (congestive) heart failure: Secondary | ICD-10-CM | POA: Diagnosis present

## 2024-07-15 DIAGNOSIS — J9621 Acute and chronic respiratory failure with hypoxia: Principal | ICD-10-CM | POA: Diagnosis present

## 2024-07-15 DIAGNOSIS — Z955 Presence of coronary angioplasty implant and graft: Secondary | ICD-10-CM

## 2024-07-15 DIAGNOSIS — J189 Pneumonia, unspecified organism: Secondary | ICD-10-CM | POA: Diagnosis present

## 2024-07-15 DIAGNOSIS — I110 Hypertensive heart disease with heart failure: Secondary | ICD-10-CM | POA: Diagnosis present

## 2024-07-15 DIAGNOSIS — J9612 Chronic respiratory failure with hypercapnia: Secondary | ICD-10-CM | POA: Diagnosis not present

## 2024-07-15 DIAGNOSIS — Z7901 Long term (current) use of anticoagulants: Secondary | ICD-10-CM

## 2024-07-15 DIAGNOSIS — F17200 Nicotine dependence, unspecified, uncomplicated: Secondary | ICD-10-CM | POA: Diagnosis not present

## 2024-07-15 LAB — BLOOD GAS, VENOUS
Acid-Base Excess: 3.2 mmol/L — ABNORMAL HIGH (ref 0.0–2.0)
Acid-Base Excess: 0.1 mmol/L (ref 0.0–2.0)
Bicarbonate: 31.5 mmol/L — ABNORMAL HIGH (ref 20.0–28.0)
Bicarbonate: 28.2 mmol/L — ABNORMAL HIGH (ref 20.0–28.0)
O2 Saturation: 34.6 %
O2 Saturation: 42.1 %
Patient temperature: 37
Patient temperature: 37
pCO2, Ven: 64 mmHg — ABNORMAL HIGH (ref 44–60)
pCO2, Ven: 60 mmHg (ref 44–60)
pH, Ven: 7.3 (ref 7.25–7.43)
pH, Ven: 7.28 (ref 7.25–7.43)
pO2, Ven: 33 mmHg (ref 32–45)

## 2024-07-15 LAB — MRSA NEXT GEN BY PCR, NASAL: MRSA by PCR Next Gen: DETECTED — AB

## 2024-07-15 LAB — COMPREHENSIVE METABOLIC PANEL WITH GFR
ALT: 14 U/L (ref 0–44)
AST: 25 U/L (ref 15–41)
Albumin: 3.3 g/dL — ABNORMAL LOW (ref 3.5–5.0)
Alkaline Phosphatase: 39 U/L (ref 38–126)
Anion gap: 13 (ref 5–15)
BUN: 12 mg/dL (ref 8–23)
CO2: 28 mmol/L (ref 22–32)
Calcium: 8.4 mg/dL — ABNORMAL LOW (ref 8.9–10.3)
Chloride: 98 mmol/L (ref 98–111)
Creatinine, Ser: 0.77 mg/dL (ref 0.44–1.00)
GFR, Estimated: >60 mL/min (ref >60)
Glucose, Bld: 97 mg/dL (ref 70–99)
Potassium: 3.6 mmol/L (ref 3.5–5.1)
Sodium: 139 mmol/L (ref 135–145)
Total Bilirubin: 0.5 mg/dL (ref 0.0–1.2)
Total Protein: 6.2 g/dL — ABNORMAL LOW (ref 6.5–8.1)

## 2024-07-15 LAB — CBC WITH DIFFERENTIAL/PLATELET
Abs Immature Granulocytes: 0.01 K/uL (ref 0.00–0.07)
Basophils Absolute: 0 K/uL (ref 0.0–0.1)
Basophils Relative: 1 %
Eosinophils Absolute: 0.1 K/uL (ref 0.0–0.5)
Eosinophils Relative: 3 %
HCT: 35.5 % — ABNORMAL LOW (ref 36.0–46.0)
Hemoglobin: 10.7 g/dL — ABNORMAL LOW (ref 12.0–15.0)
Immature Granulocytes: 0 %
Lymphocytes Relative: 30 %
Lymphs Abs: 1.1 K/uL (ref 0.7–4.0)
MCH: 30.3 pg (ref 26.0–34.0)
MCHC: 30.1 g/dL (ref 30.0–36.0)
MCV: 100.6 fL — ABNORMAL HIGH (ref 80.0–100.0)
Monocytes Absolute: 0.4 K/uL (ref 0.1–1.0)
Monocytes Relative: 11 %
Neutro Abs: 1.9 K/uL (ref 1.7–7.7)
Neutrophils Relative %: 55 %
Platelets: 230 K/uL (ref 150–400)
RBC: 3.53 MIL/uL — ABNORMAL LOW (ref 3.87–5.11)
RDW: 17 % — ABNORMAL HIGH (ref 11.5–15.5)
WBC: 3.5 K/uL — ABNORMAL LOW (ref 4.0–10.5)
nRBC: 0 % (ref 0.0–0.2)

## 2024-07-15 LAB — RESPIRATORY PANEL BY PCR

## 2024-07-15 LAB — BRAIN NATRIURETIC PEPTIDE
B Natriuretic Peptide: 107.2 pg/mL — ABNORMAL HIGH (ref 0.0–100.0)
B Natriuretic Peptide: 295.5 pg/mL — ABNORMAL HIGH (ref 0.0–100.0)

## 2024-07-15 LAB — TROPONIN I (HIGH SENSITIVITY)
Troponin I (High Sensitivity): 18 ng/L — ABNORMAL HIGH (ref <18)
Troponin I (High Sensitivity): 32 ng/L — ABNORMAL HIGH (ref <18)
Troponin I (High Sensitivity): 45 ng/L — ABNORMAL HIGH (ref <18)
Troponin I (High Sensitivity): 43 ng/L — ABNORMAL HIGH (ref <18)

## 2024-07-15 LAB — PROCALCITONIN: Procalcitonin: <0.1 ng/mL

## 2024-07-15 LAB — STREP PNEUMONIAE URINARY ANTIGEN: Strep Pneumo Urinary Antigen: NEGATIVE

## 2024-07-15 LAB — LACTIC ACID, PLASMA
Lactic Acid, Venous: 0.7 mmol/L (ref 0.5–1.9)
Lactic Acid, Venous: 0.7 mmol/L (ref 0.5–1.9)

## 2024-07-15 LAB — MAGNESIUM: Magnesium: 1.7 mg/dL (ref 1.7–2.4)

## 2024-07-15 LAB — TSH: TSH: 14.679 u[IU]/mL — ABNORMAL HIGH (ref 0.350–4.500)

## 2024-07-15 MED ORDER — ALBUTEROL SULFATE (2.5 MG/3ML) 0.083% IN NEBU: 2.5000 mg | INHALATION_SOLUTION | RESPIRATORY_TRACT | Status: DC | PRN

## 2024-07-15 MED ORDER — BUDESON-GLYCOPYRROL-FORMOTEROL 160-9-4.8 MCG/ACT IN AERO
2.0000 | INHALATION_SPRAY | Freq: Two times a day (BID) | RESPIRATORY_TRACT | Status: DC
  Administered 2024-07-15 – 2024-07-16 (×3): 2 via RESPIRATORY_TRACT
  Filled 2024-07-15: qty 5.9

## 2024-07-15 MED ORDER — SODIUM CHLORIDE 0.9 % IV SOLN
2.0000 g | INTRAVENOUS | Status: DC
  Administered 2024-07-15: 2 g via INTRAVENOUS
  Filled 2024-07-15: qty 20

## 2024-07-15 MED ORDER — PANTOPRAZOLE SODIUM 40 MG PO TBEC
40.0000 mg | DELAYED_RELEASE_TABLET | Freq: Every day | ORAL | Status: DC
  Administered 2024-07-15 – 2024-07-16 (×2): 40 mg via ORAL
  Filled 2024-07-15 (×2): qty 1

## 2024-07-15 MED ORDER — IPRATROPIUM-ALBUTEROL 0.5-2.5 (3) MG/3ML IN SOLN
6.0000 mL | Freq: Once | RESPIRATORY_TRACT | Status: AC
  Administered 2024-07-15: 6 mL via RESPIRATORY_TRACT
  Filled 2024-07-15: qty 3

## 2024-07-15 MED ORDER — METHYLPREDNISOLONE SODIUM SUCC 40 MG IJ SOLR
40.0000 mg | Freq: Two times a day (BID) | INTRAMUSCULAR | Status: DC
  Administered 2024-07-15 – 2024-07-16 (×2): 40 mg via INTRAVENOUS
  Filled 2024-07-15 (×2): qty 1

## 2024-07-15 MED ORDER — SODIUM CHLORIDE 0.9 % IV SOLN: INTRAVENOUS | Status: DC

## 2024-07-15 MED ORDER — APIXABAN 5 MG PO TABS
5.0000 mg | ORAL_TABLET | Freq: Two times a day (BID) | ORAL | Status: DC
  Administered 2024-07-15 – 2024-07-16 (×3): 5 mg via ORAL
  Filled 2024-07-15 (×3): qty 1

## 2024-07-15 MED ORDER — IPRATROPIUM-ALBUTEROL 0.5-2.5 (3) MG/3ML IN SOLN
3.0000 mL | Freq: Four times a day (QID) | RESPIRATORY_TRACT | Status: DC
  Administered 2024-07-15 – 2024-07-16 (×4): 3 mL via RESPIRATORY_TRACT
  Filled 2024-07-15 (×4): qty 3

## 2024-07-15 MED ORDER — ACETAMINOPHEN 325 MG RE SUPP: 650.0000 mg | Freq: Four times a day (QID) | RECTAL | Status: DC | PRN

## 2024-07-15 MED ORDER — SODIUM CHLORIDE 0.9 % IV BOLUS
500.0000 mL | Freq: Once | INTRAVENOUS | Status: AC
  Administered 2024-07-15: 500 mL via INTRAVENOUS

## 2024-07-15 MED ORDER — SODIUM CHLORIDE 0.9 % IV SOLN
500.0000 mg | INTRAVENOUS | Status: DC
  Administered 2024-07-15: 500 mg via INTRAVENOUS
  Filled 2024-07-15: qty 5

## 2024-07-15 MED ORDER — ATORVASTATIN CALCIUM 20 MG PO TABS
40.0000 mg | ORAL_TABLET | Freq: Every day | ORAL | Status: DC
  Administered 2024-07-15: 40 mg via ORAL
  Filled 2024-07-15: qty 2

## 2024-07-15 MED ORDER — SODIUM CHLORIDE 0.9 % IV SOLN
2.0000 g | Freq: Once | INTRAVENOUS | Status: AC
  Administered 2024-07-15: 2 g via INTRAVENOUS
  Filled 2024-07-15: qty 12.5

## 2024-07-15 MED ORDER — PREDNISONE 20 MG PO TABS: 40.0000 mg | ORAL_TABLET | Freq: Every day | ORAL | Status: DC

## 2024-07-15 MED ORDER — METHYLPREDNISOLONE SODIUM SUCC 125 MG IJ SOLR
125.0000 mg | Freq: Once | INTRAMUSCULAR | Status: AC
  Administered 2024-07-15: 125 mg via INTRAVENOUS
  Filled 2024-07-15: qty 2

## 2024-07-15 MED ORDER — ONDANSETRON HCL 4 MG/2ML IJ SOLN: 4.0000 mg | Freq: Four times a day (QID) | INTRAMUSCULAR | Status: DC | PRN

## 2024-07-15 MED ORDER — LACTATED RINGERS IV SOLN: INTRAVENOUS | Status: AC

## 2024-07-15 MED ORDER — HALOPERIDOL LACTATE 5 MG/ML IJ SOLN
2.0000 mg | Freq: Once | INTRAMUSCULAR | Status: AC
  Administered 2024-07-15: 2 mg via INTRAVENOUS
  Filled 2024-07-15: qty 1

## 2024-07-15 MED ORDER — SODIUM CHLORIDE 0.9 % IV SOLN: 2.0000 g | Freq: Two times a day (BID) | INTRAVENOUS | Status: DC

## 2024-07-15 MED ORDER — PANTOPRAZOLE SODIUM 40 MG PO TBEC: 40.0000 mg | DELAYED_RELEASE_TABLET | Freq: Every day | ORAL | 3 refills | Status: AC

## 2024-07-15 MED ORDER — SODIUM CHLORIDE 0.9 % IV SOLN: 500.0000 mg | INTRAVENOUS | Status: DC

## 2024-07-15 MED ORDER — IRON 325 (65 FE) MG PO TABS: 1.0000 | ORAL_TABLET | Freq: Two times a day (BID) | ORAL | Status: DC

## 2024-07-15 MED ORDER — CLOPIDOGREL BISULFATE 75 MG PO TABS
75.0000 mg | ORAL_TABLET | Freq: Every day | ORAL | Status: DC
  Administered 2024-07-15 – 2024-07-16 (×2): 75 mg via ORAL
  Filled 2024-07-15 (×2): qty 1

## 2024-07-15 MED ORDER — ACETAMINOPHEN 325 MG PO TABS: 650.0000 mg | ORAL_TABLET | Freq: Four times a day (QID) | ORAL | Status: DC | PRN

## 2024-07-15 MED ORDER — DAPAGLIFLOZIN PROPANEDIOL 10 MG PO TABS
10.0000 mg | ORAL_TABLET | Freq: Every day | ORAL | Status: DC
  Administered 2024-07-15 – 2024-07-16 (×2): 10 mg via ORAL
  Filled 2024-07-15 (×3): qty 1

## 2024-07-15 MED ORDER — LEVOTHYROXINE SODIUM 50 MCG PO TABS
100.0000 ug | ORAL_TABLET | Freq: Every day | ORAL | Status: DC
  Administered 2024-07-16: 100 ug via ORAL
  Filled 2024-07-15: qty 2

## 2024-07-15 MED ORDER — IOHEXOL 350 MG/ML SOLN
75.0000 mL | Freq: Once | INTRAVENOUS | Status: AC | PRN
  Administered 2024-07-15: 75 mL via INTRAVENOUS

## 2024-07-15 MED ORDER — FERROUS SULFATE 325 (65 FE) MG PO TABS
325.0000 mg | ORAL_TABLET | Freq: Two times a day (BID) | ORAL | Status: DC
  Administered 2024-07-15 – 2024-07-16 (×3): 325 mg via ORAL
  Filled 2024-07-15 (×3): qty 1

## 2024-07-15 MED ORDER — SPIRONOLACTONE 25 MG PO TABS: 25.0000 mg | ORAL_TABLET | Freq: Every day | ORAL | 3 refills | Status: DC

## 2024-07-15 MED ORDER — ONDANSETRON HCL 4 MG PO TABS: 4.0000 mg | ORAL_TABLET | Freq: Four times a day (QID) | ORAL | Status: DC | PRN

## 2024-07-15 NOTE — ED Notes (Signed)
 Called RT about VBG in lab

## 2024-07-15 NOTE — Sepsis Progress Note (Signed)
 Code Sepsis protocol being monitored by eLink.

## 2024-07-15 NOTE — ED Notes (Signed)
 Assisted patient with ambulation to bedside commode. Pt ambulated with minimal assistance.

## 2024-07-15 NOTE — ED Notes (Signed)
 Patient took mask off at this time. Refusing to wear

## 2024-07-15 NOTE — ED Triage Notes (Signed)
 Pt presents to the ED via [facility] from home. Pt has had central CP since 0745 this morning. Pt reported 10/10 pain when HR was afib at145. Pt self converted into NSR with a rate of 90 and reported 2/10 pain after conversersion. Pt had an NSTEMI last month with stent placement. Pt A&Ox4. Denies nausea, dizziness and SHOB.   BP 101/53 after 400mL NaCl.  96% on 2L HR 92 CBG 130 Oral temp 97.7   Pt took 3 nitroglycerin  without pain relief. 324mg  of aspirin  given. Pt takes eliquis  and Plavix . Pt has COPD and is on 2L O2 via [place] at [place].  20g IV left AC placed by EMS.

## 2024-07-15 NOTE — H&P (Addendum)
 History and Physical    Stacey Sandoval FMW:[id number] DOB: 15-Aug-1951 DOA: 07/15/2024  PCP: [facility]  Patient coming from: home  I have personally briefly reviewed patient's old medical records in [facility]  Chief Complaint: chest pain  HPI: Stacey Sandoval is a 73 y.o. female with medical history significant of Atrial fibrillation,CHFpef, HLD, HTN, COPD,Tobacco  with interim history of admission 06/12/2024 at which time patient was found to have NSTEMI for which Stacey Sandoval underwent cath noting severe RCA stenosis as well as mod multivessel disease.  Patient was then transferred to [facility] and under went PCI with DES to RCA . Patient now presents to ED with complaint of chest pain.  Patient in the field was found to have  afib with rvr to 140's but converted to sinus on Stacey own. Patient of note had taken multiple doses of nitroglycerin  prior to EMS arrival.  Patient of note after conversion to sinus noted decrease chest pain. Patient noted no associated fever/chills/ cough/ presyncope/ n/v/d or dysuria or feeling ill prior to onset of symptoms. Patient is alert and oriented to place time and situation. He Sandoval is at bedside.  Patient noted Stacey breathing is much improved  and notes no current chest pain .  Stacey Sandoval also notes Stacey Sandoval does not want to where bipap , Stacey Sandoval also notes Stacey wish of DNI/DNR.  Patient's Sandoval states mother has expressed wishes for DNI/DNR in the past. Stacey Sandoval also states that patient at times will refuse intervention but will eventually be accepting of treatment. Discussed that since patient was able to make he out decisions and was Alert and Oriented and make he wishes and need known with would have to have abide by Stacey wishes.    ED Course:  Vitals: afeb, bp 93/52, hr 90, rr 18 sat 100% on 2L  WBC 3.5, hgb 10.7,  MCV 100.6, plt 230 Na 139, K 3.6, Cl 98, cr 0.77,  Mag 1.7 CE 18,32 BNP 107.2 Rkm:[id number]: New area of atelectasis in the middle lobe. Otherwise  no acute cardiopulmonary abnormality. Vbg: 7.3/64 increase to 69   CTPE IMPRESSION: No evidence of pulmonary embolus.   Diffuse 3 vessel coronary artery disease.   Airway thickening and mucous plugging in the lower lungs. Air bronchograms again noted in the right middle lobe at the right lung base, favor atelectasis although infiltrate cannot be completely excluded.   Aortic Atherosclerosis (ICD10-I70.0).   Tx 500ml NS, tx solumedrol 125, douneb  cefepime    EKG:  nsr 89, nonspecific t wave change in inferior leads  Review of Systems: As per HPI otherwise 10 point review of systems negative.   Past Medical History:  Diagnosis Date   Atrial fibrillation (HCC)    CHF (congestive heart failure) (HCC)    Complication of anesthesia    VERY slow to wake after hysterectomy   COPD (chronic obstructive pulmonary disease) (HCC)    Enlarged thyroid     has been [facility] to [facility]   Hyperlipidemia    Hypertension    Smokers' cough (HCC)    Tobacco use    Wears dentures    Full upper and lower    Past Surgical History:  Procedure Laterality Date   ABDOMINAL HYSTERECTOMY  1990   BREAST LUMPECTOMY Right 1974   CATARACT EXTRACTION W/PHACO Right 06/21/2022   Procedure: CATARACT EXTRACTION PHACO AND INTRAOCULAR LENS PLACEMENT (IOC) RIGHT 9.09 00:51.1;  Surgeon: Jaye Fallow, MD;  Location: [facility];  Service: Ophthalmology;  Laterality: Right;  CATARACT EXTRACTION W/PHACO Left 07/05/2022   Procedure: CATARACT EXTRACTION PHACO AND INTRAOCULAR LENS PLACEMENT (IOC) LEFT 7.05 00:45.9;  Surgeon: Jaye Fallow, MD;  Location: [facility];  Service: Ophthalmology;  Laterality: Left;   CHOLECYSTECTOMY  2013   CORONARY ATHERECTOMY N/A 06/18/2024   Procedure: CORONARY ATHERECTOMY;  Surgeon: Anner Alm ORN, MD;  Location: [facility] INVASIVE CV LAB;  Service: Cardiovascular;  Laterality: N/A;   CORONARY IMAGING/OCT N/A 06/18/2024   Procedure: CORONARY IMAGING/OCT;  Surgeon:  Anner Alm ORN, MD;  Location: [facility] INVASIVE CV LAB;  Service: Cardiovascular;  Laterality: N/A;   CORONARY STENT INTERVENTION N/A 06/18/2024   Procedure: CORONARY STENT INTERVENTION;  Surgeon: Anner Alm ORN, MD;  Location: [facility] INVASIVE CV LAB;  Service: Cardiovascular;  Laterality: N/A;  RCA   ESOPHAGOGASTRODUODENOSCOPY N/A 06/19/2024   Procedure: EGD (ESOPHAGOGASTRODUODENOSCOPY);  Surgeon: Legrand Victory LITTIE DOUGLAS, MD;  Location: [facility];  Service: Gastroenterology;  Laterality: N/A;   LEFT HEART CATH AND CORONARY ANGIOGRAPHY N/A 06/13/2024   Procedure: LEFT HEART CATH AND CORONARY ANGIOGRAPHY;  Surgeon: Anner Alm ORN, MD;  Location: [facility] INVASIVE CV LAB;  Service: Cardiovascular;  Laterality: N/A;     reports that Stacey Sandoval has been smoking cigarettes. Stacey Sandoval has a 10 pack-year smoking history. Stacey Sandoval has never used smokeless tobacco. Stacey Sandoval reports current alcohol  use of about 7.0 standard drinks of alcohol  per week. Stacey Sandoval reports that Stacey Sandoval does not use drugs.  Allergies  Allergen Reactions   Dextromethorphan -Guaifenesin  Shortness Of Breath, Anxiety and Other (See Comments)    Globus pharyngeus   Codeine     Other Reaction(s): sedation   Ibuprofen     Other Reaction(s): GI upset   Trazodone Nausea Only   Zolpidem Nausea Only    Family History  Problem Relation Age of Onset   Hypertension Mother    Heart disease Mother        CABG   Heart disease Father    Heart attack Father     Prior to Admission medications   Medication Sig Start Date End Date Taking? Authorizing Provider  acetaminophen  (TYLENOL ) 650 MG CR tablet Take 1,300 mg by mouth 2 (two) times daily.    [provider]  albuterol  (PROVENTIL ) (2.5 MG/3ML) 0.083% nebulizer solution Take 3 mLs (2.5 mg total) by nebulization every 6 (six) hours as needed for wheezing or shortness of breath. 03/11/22   Awanda City, MD  apixaban  (ELIQUIS ) 5 MG TABS tablet Take 1 tablet (5 mg total) by mouth 2 (two) times daily. 06/21/24   Henry Manuelita NOVAK, NP  atorvastatin  (LIPITOR) 40 MG tablet Take 1 tablet (40 mg total) by mouth daily. 06/14/24   Wouk, Devaughn Sayres, MD  bisoprolol  (ZEBETA ) 5 MG tablet TAKE 1 TABLET BY MOUTH EVERY DAY 05/02/23   Gollan, Timothy J, MD  Budeson-Glycopyrrol-Formoterol  (BREZTRI  AEROSPHERE) 160-9-4.8 MCG/ACT AERO Inhale 2 puffs into the lungs in the morning and at bedtime. 09/01/22   Tamea Dedra LITTIE, MD  calcitRIOL (ROCALTROL) 0.25 MCG capsule Take 0.25 mcg by mouth 2 (two) times daily. 09/04/23   [provider]  clopidogrel  (PLAVIX ) 75 MG tablet Take 1 tablet (75 mg total) by mouth daily. 06/22/24   Henry Manuelita NOVAK, NP  dapagliflozin  propanediol (FARXIGA ) 10 MG TABS tablet Take 1 tablet (10 mg total) by mouth daily. 06/22/24   Henry Manuelita NOVAK, NP  diltiazem  (CARDIZEM  CD) 180 MG 24 hr capsule TAKE 1 CAPSULE(180 MG) BY MOUTH DAILY 04/17/23   Gollan, Timothy J, MD  diltiazem  (CARDIZEM ) 30 MG tablet  Take 1 tablet (30 mg total) by mouth as needed (atrial fibrillation). 04/18/22   Gollan, Timothy J, MD  Ferrous Sulfate  (IRON ) 325 (65 Fe) MG TABS Take 1 tablet (325 mg total) by mouth in the morning and at bedtime. 06/21/24   Henry Manuelita NOVAK, NP  furosemide  (LASIX ) 20 MG tablet Take 1 tablet (20 mg total) by mouth daily as needed. 06/21/24   Henry Manuelita NOVAK, NP  gabapentin  (NEURONTIN ) 300 MG capsule Take 300 mg every am & 600 in the pm 02/23/22   [provider]  levothyroxine  (SYNTHROID ) 100 MCG tablet Take 100 mcg by mouth daily before breakfast.    [provider]  mirtazapine  (REMERON ) 45 MG tablet Take 45 mg by mouth at bedtime. 02/01/22   [provider]  nitroGLYCERIN  (NITROSTAT ) 0.4 MG SL tablet Place 1 tablet (0.4 mg total) under the tongue every 5 (five) minutes x 3 doses as needed for chest pain. 06/21/24   Henry Manuelita NOVAK, NP  OXYGEN  Inhale into the lungs as needed. 2-3 Liters    [provider]  pantoprazole  (PROTONIX ) 40 MG tablet Take 1 tablet (40 mg total) by  mouth daily. 07/15/24   Gollan, Timothy J, MD  potassium chloride  SA (KLOR-CON  M) 20 MEQ tablet Take 20 mEq by mouth 4 (four) times daily as needed (prn).    [provider]  spironolactone  (ALDACTONE ) 25 MG tablet Take 1 tablet (25 mg total) by mouth daily. 07/15/24   Gollan, Timothy J, MD  triamcinolone ointment (KENALOG) 0.5 % Apply 1 Application topically 2 (two) times daily. 05/20/24   [provider]    Physical Exam: Vitals:   07/15/24 1023 07/15/24 1045 07/15/24 1130 07/15/24 1230  BP:  (!) 92/54 (!) 92/47 (!) 100/57  Pulse: 81 78 71 79  Resp: 19 18 (!) 21 20  Temp:      TempSrc:      SpO2: 97% 100% 100% 100%  Weight:      Height:        Constitutional: NAD, calm, comfortable Vitals:   07/15/24 1023 07/15/24 1045 07/15/24 1130 07/15/24 1230  BP:  (!) 92/54 (!) 92/47 (!) 100/57  Pulse: 81 78 71 79  Resp: 19 18 (!) 21 20  Temp:      TempSrc:      SpO2: 97% 100% 100% 100%  Weight:      Height:       Eyes: pupils equal, lids and conjunctivae normal ENMT: Mucous membranes are moist. Posterior pharynx clear of any exudate or lesions.Normal dentition.  Neck: normal, supple, no masses, no thyromegaly Respiratory: clear to auscultation bilaterally, no wheezing, no crackles. Normal respiratory effort. No accessory muscle use.  Cardiovascular: Regular rate and rhythm, no murmurs / rubs / gallops. No extremity edema. 2+ pedal pulses.  Abdomen: no tenderness, no masses palpated. No hepatosplenomegaly. Bowel sounds positive.  Musculoskeletal: no clubbing / cyanosis. No joint deformity upper and lower extremities. Good ROM, no contractures. Normal muscle tone.  Skin: no rashes, lesions, ulcers. No induration Neurologic: CN grossly intact. Sensation intact, Strength 5/5 in all 4.  Psychiatric:. Alert and oriented x 3. Normal mood.    Labs on Admission: I have personally reviewed following labs and imaging studies  CBC: Recent Labs  Lab 07/12/24 1449 07/15/24 0929   WBC 5.4 3.5*  NEUTROABS  --  1.9  HGB 10.9* 10.7*  HCT 35.6 35.5*  MCV 98* 100.6*  PLT 307 230   Basic Metabolic Panel: Recent Labs  Lab 07/15/24 0929  NA 139  K 3.6  CL 98  CO2 28  GLUCOSE 97  BUN 12  CREATININE 0.77  CALCIUM  8.4*  MG 1.7   GFR: Estimated Creatinine Clearance: 50.9 mL/min (by C-G formula based on SCr of 0.77 mg/dL). Liver Function Tests: Recent Labs  Lab 07/15/24 0929  AST 25  ALT 14  ALKPHOS 39  BILITOT 0.5  PROT 6.2*  ALBUMIN  3.3*   No results for input(s): LIPASE, AMYLASE in the last 168 hours. No results for input(s): AMMONIA in the last 168 hours. Coagulation Profile: No results for input(s): INR, PROTIME in the last 168 hours. Cardiac Enzymes: No results for input(s): CKTOTAL, CKMB, CKMBINDEX, TROPONINI in the last 168 hours. BNP (last 3 results) No results for input(s): PROBNP in the last 8760 hours. HbA1C: No results for input(s): HGBA1C in the last 72 hours. CBG: No results for input(s): GLUCAP in the last 168 hours. Lipid Profile: No results for input(s): CHOL, HDL, LDLCALC, TRIG, CHOLHDL, LDLDIRECT in the last 72 hours. Thyroid  Function Tests: No results for input(s): TSH, T4TOTAL, FREET4, T3FREE, THYROIDAB in the last 72 hours. Anemia Panel: No results for input(s): VITAMINB12, FOLATE, FERRITIN, TIBC, IRON , RETICCTPCT in the last 72 hours. Urine analysis:    Component Value Date/Time   COLORURINE COLORLESS (A) 06/15/2024 1451   APPEARANCEUR CLEAR 06/15/2024 1451   LABSPEC 1.004 (L) 06/15/2024 1451   PHURINE 6.0 06/15/2024 1451   GLUCOSEU NEGATIVE 06/15/2024 1451   HGBUR NEGATIVE 06/15/2024 1451   BILIRUBINUR NEGATIVE 06/15/2024 1451   KETONESUR NEGATIVE 06/15/2024 1451   PROTEINUR NEGATIVE 06/15/2024 1451   NITRITE NEGATIVE 06/15/2024 1451   LEUKOCYTESUR NEGATIVE 06/15/2024 1451    Radiological Exams on Admission: CT Angio Chest Pulmonary Embolism (PE) W or WO  Contrast Result Date: 07/15/2024 CLINICAL DATA:  Pulmonary embolism (PE) suspected, high prob. Chest pain. EXAM: CT ANGIOGRAPHY CHEST WITH CONTRAST TECHNIQUE: Multidetector CT imaging of the chest was performed using the standard protocol during bolus administration of intravenous contrast. Multiplanar CT image reconstructions and MIPs were obtained to evaluate the vascular anatomy. RADIATION DOSE REDUCTION: This exam was performed according to the departmental dose-optimization program which includes automated exposure control, adjustment of the mA and/or kV according to patient size and/or use of iterative reconstruction technique. CONTRAST:  75mL OMNIPAQUE  IOHEXOL  350 MG/ML SOLN COMPARISON:  06/12/2024 FINDINGS: Cardiovascular: No filling defects in the pulmonary arteries to suggest pulmonary emboli. Heart is mildly enlarged. Diffuse 3 vessel coronary artery disease and moderate aortic atherosclerosis. No evidence of aortic aneurysm. Mediastinum/Nodes: No mediastinal, hilar, or axillary adenopathy. Trachea and esophagus are unremarkable. Thyroid  unremarkable. Lungs/Pleura: Stable scarring in the medial left apex. Scarring in the lingula. Dependent atelectasis in the lower lobes and lung bases. Bilateral lower lobe airway thickening. Mucous plugging within the right lower lobe and right middle lobe. Air bronchograms noted again in the right middle lobe at the lung base, most likely atelectasis. No effusions. Upper Abdomen: No acute findings. Calcifications throughout the spleen compatible with old granulomatous disease. Musculoskeletal: Chest wall soft tissues are unremarkable. No acute bony abnormality. Review of the MIP images confirms the above findings. IMPRESSION: No evidence of pulmonary embolus. Diffuse 3 vessel coronary artery disease. Airway thickening and mucous plugging in the lower lungs. Air bronchograms again noted in the right middle lobe at the right lung base, favor atelectasis although infiltrate  cannot be completely excluded. Aortic Atherosclerosis (ICD10-I70.0). Electronically Signed   By: Franky Crease M.D.   On: 07/15/2024 12:34  DG Chest 2 View Result Date: 07/15/2024 CLINICAL DATA:  chest pain. EXAM: CHEST - 2 VIEW COMPARISON:  06/20/2024. FINDINGS: Since the prior study, there is new area of atelectasis in the middle lobe. Bilateral lung fields are otherwise clear. No acute consolidation or lung collapse. Note is made of elevated right hemidiaphragm. Bilateral costophrenic angles are clear. Stable cardio-mediastinal silhouette. No acute osseous abnormalities. The soft tissues are within normal limits. IMPRESSION: New area of atelectasis in the middle lobe. Otherwise no acute cardiopulmonary abnormality. Electronically Signed   By: Ree Molt M.D.   On: 07/15/2024 10:24    EKG: Independently reviewed.  Assessment/Plan  AECOPD due to CAP with hypercarbic on chronic hypoxic respiratory failure  -placed on bipap for increasing CO2  ph 7.3/69,however patient not able to tolerate and refused further treatment - solumedrol iv , bid taper to po prednisone  daily - broad spectrum antibiotics -f/u on sputum cultures  - nebs standing and prn  -resume chronic inhalers  -pulmonary toilet  -wean O2 as able   -monitor vbg - ICU consult appreciated   CAP presumed bacterial  -patient with mild leukopenia and  Opacities on chest imaging  -start broad spectrum abx -de-escalate as able  -pulmonary toilet  -urine ag, sputum, f/u on culture data  -check full RVP -trend inflammatory markers   Afib RVR with associated Chest pain -presumed due to infection /AECOPD -converted on Stacey own  -continue on apixaban   -resume cardizem  as bp tolerates   Hx of CAD s/p NSTEMI s/DES -Now with Chest pain  and abnormal CE -Chest pain presumed due to Afib with rvr - Abn CE  -presumed due to demand  -continue plavix /apixaban  -noted no significant delta  -will continue to monitor ce over night to be  complete due to pt recent hx of nstemi requiring DES 1 mo ago  -repeat echo  -cardiology consult if echo notes change or CE trend upward  -resume cardiotonics once med rec completed  as bp tolerates  CHFpef -appears well compensated  - resume medications as bp tolerates   HLD -continue statin  Hypothyroidism  -continue with synthroid   -check tsh  Tobacco abuse -will with copd/cad recent nstemi on home O2  - tobacco counseling when patient more stable and alert     DVT prophylaxis:  OAC Code Status: full/ as discussed per patient wishes in event of cardiac arrest  Family Communication: none at bedside Disposition Plan: patient  expected to be admitted greater than 2 midnights  Consults called: consider cardiology base on trend of CE and echo results Admission status:  step down    Camila DELENA Ned MD [facility]   If 7PM-7AM, please contact night-coverage [email or web address] Password [email or web address]  07/15/2024, 1:46 PM

## 2024-07-15 NOTE — ED Notes (Signed)
 Pt to CT at this time.

## 2024-07-15 NOTE — Consult Note (Signed)
 NAME:  Stacey Sandoval, MRN:  [id number], DOB:  1951/01/04, LOS: 0 ADMISSION DATE:  07/15/2024, CONSULTATION DATE:  07/15/2024 REFERRING MD:  Camila Ned, MD, CHIEF COMPLAINT:  AECOPD   History of Present Illness:   Patient is a 73 year old female with a history of CAD and COPD presenting to the hospital with chest tightness.  Patient experienced increased shortness of breath and chest tightness today, akin to previous symptoms around the time of CAD diagnosis, prompting presentation to the ED. She denies any fevers, chills, increased sputum production, worsening cough, or chest pain. She has not had any report of wheezing.  On presentation to the ED, she was noted to be somnolent. Her initial blood gas was noted with a pH of 7.3 (CO2 of 64), and BiPAP was ordered. Patient has been unable to tolerate the BiPAP. There was consideration for initiation of dexmedetomidine given anxiety/agitation with BiPAP and PCCM is consulted for comment.  Review of the medical record is noted for admission 7/3 with chest pain and NSTEMI, found to have multi-vessel disease. She underwent atherectomy on 7/8 at [facility] with DES placement to RCA (x2). She also has a history of COPD, follwed by Dr. Tamea in our clinic, last seen 05/2023, and maintained on Breztri .  Patient reports a history of smoking, and continues to actively smoke. Last cigarette was last night.  Pertinent  Medical History   -CAD (DES to RCA x2 06/2024) -COPD -Afib on Apixaban  -HFpEF -HFpEF -Smoking   Objective    Blood pressure (!) 100/57, pulse 79, temperature 98.1 F (36.7 C), temperature source Oral, resp. rate 20, height 4' 11 (1.499 m), weight 68 kg, SpO2 100%.    FiO2 (%):  [24 %] 24 %   Intake/Output Summary (Last 24 hours) at 07/15/2024 1520 Last data filed at 07/15/2024 1148 Gross per 24 hour  Intake 500 ml  Output --  Net 500 ml   Filed Weights   07/15/24 0924  Weight: 68 kg    Examination: Physical  Exam Constitutional:      Appearance: Normal appearance.  Cardiovascular:     Rate and Rhythm: Normal rate and regular rhythm.     Pulses: Normal pulses.     Heart sounds: Normal heart sounds.  Pulmonary:     Effort: Pulmonary effort is normal.     Breath sounds: Wheezing present. No rhonchi or rales.  Neurological:     General: No focal deficit present.     Mental Status: She is alert and oriented to person, place, and time. Mental status is at baseline.     Motor: No weakness.     Comments: anxious     Assessment and Plan   73 year old female with a history of CAD and COPD presenting to the hospital with shortness of breath consistent with a COPD exacerbation.  #Acute Hypoxic and Hypercapnic Respiratory Failure. #Acute Exacerbation of COPD #CO2 Narcosis #CAD s/p DES to RCA #Afib on Apixaban  #Severe Anxiety #HFpEF  Neuro - patient is anxious and unable to tolerate BiPAP mask being on. Given concern that her hypercapnia is contributing to encephalopathy, would avoid sedatives such as dexmedetomidine and would encourage the patient to be compliant with BiPAP. GCS 15 and she is able to protect her airway. CV - history of CAD and Afib, on Apixaban  and Clopidogril. Minimal elevation in troponin, would trend and obtain periodic EKGs to assess for any dynamic changes. Given chest tightness, would also get TTE to assess for pericarditis. Pulmonary -  COPD exacerbation in the setting of active smoking resulting in acute hypoxic and hypercapnic respiratory failure. Continue to encourage patient to wear her BiPAP while hospitalized and treat COPD exacerbation with standing duo-nebs, steroids, and antibiotics. Would repeat venous blood gas in the PM and encourage goals of care conversation with patient and daughter. Strict SpO2 goals of 88 to 92%. Renal - kidney function at baseline GI - hold diet while on BiPAP Endo - on steroids for COPD exacerbation, agree with taper Hem/Onc - continue  clopidogrel  and apixaban  ID - on broad spectrum antibiotics for CAP coverage, will narrow to CTX/Azithro. Viral panel pending, will add respiratory cultures.  PCCM will continue to follow. Please reach back out if the patient's venous blood gas shows worsening acidosis or her clinical status/mental status deteriorates.  Best Practice (right click and Reselect all SmartList Selections daily)   Diet/type: NPO DVT prophylaxis DOAC Pressure ulcer(s): N/A GI prophylaxis: PPI Lines: N/A Foley:  N/A Code Status:  full code Last date of multidisciplinary goals of care discussion [07/15/2024]  Labs   CBC: Recent Labs  Lab 07/12/24 1449 07/15/24 0929  WBC 5.4 3.5*  NEUTROABS  --  1.9  HGB 10.9* 10.7*  HCT 35.6 35.5*  MCV 98* 100.6*  PLT 307 230    Basic Metabolic Panel: Recent Labs  Lab 07/15/24 0929  NA 139  K 3.6  CL 98  CO2 28  GLUCOSE 97  BUN 12  CREATININE 0.77  CALCIUM  8.4*  MG 1.7   GFR: Estimated Creatinine Clearance: 50.9 mL/min (by C-G formula based on SCr of 0.77 mg/dL). Recent Labs  Lab 07/12/24 1449 07/15/24 0929 07/15/24 1346  WBC 5.4 3.5*  --   LATICACIDVEN  --   --  0.7    Liver Function Tests: Recent Labs  Lab 07/15/24 0929  AST 25  ALT 14  ALKPHOS 39  BILITOT 0.5  PROT 6.2*  ALBUMIN  3.3*   No results for input(s): LIPASE, AMYLASE in the last 168 hours. No results for input(s): AMMONIA in the last 168 hours.  ABG    Component Value Date/Time   PHART 7.298 (L) 06/18/2024 1528   PCO2ART 72.7 (HH) 06/18/2024 1528   PO2ART 49 (L) 06/18/2024 1528   HCO3 34.7 (H) 07/15/2024 1348   TCO2 38 (H) 06/18/2024 1528   O2SAT 25.2 07/15/2024 1348     Coagulation Profile: No results for input(s): INR, PROTIME in the last 168 hours.  Cardiac Enzymes: No results for input(s): CKTOTAL, CKMB, CKMBINDEX, TROPONINI in the last 168 hours.  HbA1C: No results found for: HGBA1C  CBG: No results for input(s): GLUCAP in the  last 168 hours.  Review of Systems:   Review of Systems  Constitutional:  Negative for chills and fever.  Respiratory:  Positive for cough, shortness of breath and wheezing. Negative for hemoptysis and sputum production.   Cardiovascular:  Negative for chest pain.     Past Medical History:  She,  has a past medical history of Atrial fibrillation (HCC), CHF (congestive heart failure) (HCC), Complication of anesthesia, COPD (chronic obstructive pulmonary disease) (HCC), Enlarged thyroid , Hyperlipidemia, Hypertension, Smokers' cough (HCC), Tobacco use, and Wears dentures.   Surgical History:   Past Surgical History:  Procedure Laterality Date   ABDOMINAL HYSTERECTOMY  1990   BREAST LUMPECTOMY Right 1974   CATARACT EXTRACTION W/PHACO Right 06/21/2022   Procedure: CATARACT EXTRACTION PHACO AND INTRAOCULAR LENS PLACEMENT (IOC) RIGHT 9.09 00:51.1;  Surgeon: Jaye Fallow, MD;  Location: [facility];  Service:  Ophthalmology;  Laterality: Right;   CATARACT EXTRACTION W/PHACO Left 07/05/2022   Procedure: CATARACT EXTRACTION PHACO AND INTRAOCULAR LENS PLACEMENT (IOC) LEFT 7.05 00:45.9;  Surgeon: Jaye Fallow, MD;  Location: [facility];  Service: Ophthalmology;  Laterality: Left;   CHOLECYSTECTOMY  2013   CORONARY ATHERECTOMY N/A 06/18/2024   Procedure: CORONARY ATHERECTOMY;  Surgeon: Anner Alm ORN, MD;  Location: [facility] INVASIVE CV LAB;  Service: Cardiovascular;  Laterality: N/A;   CORONARY IMAGING/OCT N/A 06/18/2024   Procedure: CORONARY IMAGING/OCT;  Surgeon: Anner Alm ORN, MD;  Location: [facility] INVASIVE CV LAB;  Service: Cardiovascular;  Laterality: N/A;   CORONARY STENT INTERVENTION N/A 06/18/2024   Procedure: CORONARY STENT INTERVENTION;  Surgeon: Anner Alm ORN, MD;  Location: [facility] INVASIVE CV LAB;  Service: Cardiovascular;  Laterality: N/A;  RCA   ESOPHAGOGASTRODUODENOSCOPY N/A 06/19/2024   Procedure: EGD (ESOPHAGOGASTRODUODENOSCOPY);  Surgeon: Legrand Victory LITTIE DOUGLAS, MD;   Location: [facility] ENDOSCOPY;  Service: Gastroenterology;  Laterality: N/A;   LEFT HEART CATH AND CORONARY ANGIOGRAPHY N/A 06/13/2024   Procedure: LEFT HEART CATH AND CORONARY ANGIOGRAPHY;  Surgeon: Anner Alm ORN, MD;  Location: [facility] INVASIVE CV LAB;  Service: Cardiovascular;  Laterality: N/A;     Social History:   reports that she has been smoking cigarettes. She has a 10 pack-year smoking history. She has never used smokeless tobacco. She reports current alcohol  use of about 7.0 standard drinks of alcohol  per week. She reports that she does not use drugs.   Family History:  Her family history includes Heart attack in her father; Heart disease in her father and mother; Hypertension in her mother.   Allergies Allergies  Allergen Reactions   Dextromethorphan -Guaifenesin  Shortness Of Breath, Anxiety and Other (See Comments)    Globus pharyngeus   Codeine     Other Reaction(s): sedation   Ibuprofen     Other Reaction(s): GI upset   Trazodone Nausea Only   Zolpidem Nausea Only     Home Medications  Prior to Admission medications   Medication Sig Start Date End Date Taking? Authorizing Provider  acetaminophen  (TYLENOL ) 650 MG CR tablet Take 1,300 mg by mouth 2 (two) times daily.   Yes [provider]  albuterol  (PROVENTIL ) (2.5 MG/3ML) 0.083% nebulizer solution Take 3 mLs (2.5 mg total) by nebulization every 6 (six) hours as needed for wheezing or shortness of breath. 03/11/22  Yes Awanda City, MD  apixaban  (ELIQUIS ) 5 MG TABS tablet Take 1 tablet (5 mg total) by mouth 2 (two) times daily. 06/21/24  Yes Henry Manuelita NOVAK, NP  atorvastatin  (LIPITOR) 40 MG tablet Take 1 tablet (40 mg total) by mouth daily. 06/14/24  Yes Wouk, Devaughn Sayres, MD  bisoprolol  (ZEBETA ) 5 MG tablet TAKE 1 TABLET BY MOUTH EVERY DAY 05/02/23  Yes Gollan, Timothy J, MD  Budeson-Glycopyrrol-Formoterol  (BREZTRI  AEROSPHERE) 160-9-4.8 MCG/ACT AERO Inhale 2 puffs into the lungs in the morning and at bedtime. 09/01/22  Yes  Tamea Dedra LITTIE, MD  calcitRIOL (ROCALTROL) 0.25 MCG capsule Take 0.25 mcg by mouth 2 (two) times daily. 09/04/23  Yes [provider]  clopidogrel  (PLAVIX ) 75 MG tablet Take 1 tablet (75 mg total) by mouth daily. 06/22/24  Yes Henry Manuelita B, NP  dapagliflozin  propanediol (FARXIGA ) 10 MG TABS tablet Take 1 tablet (10 mg total) by mouth daily. 06/22/24  Yes Henry Manuelita B, NP  diltiazem  (CARDIZEM  CD) 180 MG 24 hr capsule TAKE 1 CAPSULE(180 MG) BY MOUTH DAILY 04/17/23  Yes Gollan, Timothy J, MD  diltiazem  (CARDIZEM ) 30 MG tablet  Take 1 tablet (30 mg total) by mouth as needed (atrial fibrillation). 04/18/22  Yes Gollan, Timothy J, MD  Ferrous Sulfate  (IRON ) 325 (65 Fe) MG TABS Take 1 tablet (325 mg total) by mouth in the morning and at bedtime. 06/21/24  Yes Henry Manuelita NOVAK, NP  furosemide  (LASIX ) 20 MG tablet Take 1 tablet (20 mg total) by mouth daily as needed. 06/21/24  Yes Henry Manuelita NOVAK, NP  gabapentin  (NEURONTIN ) 300 MG capsule Take 300 mg every am & 600 in the pm 02/23/22  Yes [provider]  levothyroxine  (SYNTHROID ) 100 MCG tablet Take 100 mcg by mouth daily before breakfast.   Yes [provider]  mirtazapine  (REMERON ) 45 MG tablet Take 45 mg by mouth at bedtime. 02/01/22  Yes [provider]  nitroGLYCERIN  (NITROSTAT ) 0.4 MG SL tablet Place 1 tablet (0.4 mg total) under the tongue every 5 (five) minutes x 3 doses as needed for chest pain. 06/21/24  Yes Henry Manuelita B, NP  pantoprazole  (PROTONIX ) 40 MG tablet Take 1 tablet (40 mg total) by mouth daily. 07/15/24  Yes Gollan, Timothy J, MD  potassium chloride  SA (KLOR-CON  M) 20 MEQ tablet Take 20 mEq by mouth 4 (four) times daily as needed (prn).   Yes [provider]  spironolactone  (ALDACTONE ) 25 MG tablet Take 1 tablet (25 mg total) by mouth daily. 07/15/24  Yes Gollan, Timothy J, MD  triamcinolone ointment (KENALOG) 0.5 % Apply 1 Application topically 2 (two) times daily. 05/20/24  Yes  [provider]  OXYGEN  Inhale into the lungs as needed. 2-3 Liters    [provider]     The patient is critically ill due to acute hypoxic and hypercapnic respiratory failure.  Critical care was necessary to treat or prevent imminent or life-threatening deterioration. Critical care time was spent by me on the following activities: development of a treatment plan with the patient and/or surrogate as well as nursing, discussions with consultants, evaluation of the patient's response to treatment, examination of the patient, obtaining a history from the patient or surrogate, ordering and performing treatments and interventions, ordering and review of laboratory studies, ordering and review of radiographic studies, review of telemetry data including pulse oximetry, re-evaluation of patient's condition and participation in multidisciplinary rounds.   I personally spent 42 minutes providing critical care not including any separately billable procedures.   Belva November, MD [facility] 07/15/2024 4:05 PM

## 2024-07-15 NOTE — ED Provider Notes (Addendum)
 [facility] Provider Note    Event Date/Time   First MD Initiated Contact with Patient 07/15/24 [phone]     (approximate)   History   Chest Pain   HPI  [name] is a 73 y.o. female past medical history significant for paroxysmal atrial fibrillation on Eliquis , COPD on chronic 2 L of home oxygen , HFpEF, tobacco use, recent NSTEMI, who presents to the emergency department with chest pain and shortness of breath.  Patient initially called out for chest pain shortness of breath that she rated as a 10/10.  When EMS arrived patient was tachycardic and found to be in atrial fibrillation with a rapid rate.  Patient self converted back to normal sinus rhythm with a heart rate of 90 and resolution of her chest pain and shortness of breath.  Denies history of DVT or PE.  Patient took 3 nitroglycerin  without significant improvement of her pain.  Given 324 mg of aspirin .  Compliant with her Eliquis .  On chart review patient had an admission to [facility] 06/12/2024 with chest pain, troponin in the 400s, left heart cath showed proximal RCA lesion at 95% stenosis, ultimately treated with medical management.  No aortic valve stenosis.  Recommended to load with antiplatelet therapy -underwent cardiac catheterization on 7/8 with successful drug-eluting stent x 2.  Complicated by melena, evaluated by GI and had an EGD with single bleeding lesion that was treated with a clip.  Was started on a PPI and Eliquis  was resumed on 7/14.     Physical Exam   Triage Vital Signs: ED Triage Vitals  Encounter Vitals Group     BP 07/15/24 0927 (!) 93/52     Girls Systolic BP Percentile --      Girls Diastolic BP Percentile --      Boys Systolic BP Percentile --      Boys Diastolic BP Percentile --      Pulse Rate 07/15/24 0927 90     Resp 07/15/24 0927 18     Temp 07/15/24 0927 98.1 F (36.7 C)     Temp Source 07/15/24 0927 Oral     SpO2 07/15/24 0927 100 %     Weight 07/15/24 0924 150 lb  (68 kg)     Height 07/15/24 0924 4' 11 (1.499 m)     Head Circumference --      Peak Flow --      Pain Score 07/15/24 0924 2     Pain Loc --      Pain Education --      Exclude from Growth Chart --     Most recent vital signs: Vitals:   07/15/24 1130 07/15/24 1230  BP: (!) 92/47 (!) 100/57  Pulse: 71 79  Resp: (!) 21 20  Temp:    SpO2: 100% 100%    Physical Exam Constitutional:      Appearance: She is well-developed.  HENT:     Head: Atraumatic.  Eyes:     Conjunctiva/sclera: Conjunctivae normal.  Cardiovascular:     Rate and Rhythm: Regular rhythm.  Pulmonary:     Effort: No respiratory distress.     Breath sounds: Examination of the right-middle field reveals rhonchi. Examination of the left-middle field reveals rhonchi. Examination of the right-lower field reveals rhonchi. Examination of the left-lower field reveals rhonchi. Rhonchi present. No wheezing.     Comments: Chronic 2 L nasal cannula Abdominal:     General: There is no distension.     Palpations:  Abdomen is soft.     Tenderness: There is no abdominal tenderness.  Musculoskeletal:        General: Normal range of motion.     Cervical back: Normal range of motion.     Right lower leg: No edema.     Left lower leg: No edema.  Skin:    General: Skin is warm.     Capillary Refill: Capillary refill takes less than 2 seconds.  Neurological:     General: No focal deficit present.     Mental Status: She is alert. Mental status is at baseline.     Comments: Somnolent but arousable     IMPRESSION / MDM / ASSESSMENT AND PLAN / ED COURSE  I reviewed the triage vital signs and the nursing notes.  Differential diagnosis including paroxysmal atrial fibrillation, ACS, pneumonia, pneumothorax, heart failure, COPD exacerbation  EKG  I, Clotilda Punter, the attending physician, personally viewed and interpreted this ECG.  EKG with normal sinus rhythm.  T wave inverted to lead III and aVF, inverted laterally.  This  appeared new when compared to prior EKG, last EKG was August 1.  No significant ST ovation.  No tachycardic or bradycardic dysrhythmias while on cardiac telemetry.  RADIOLOGY I independently reviewed imaging, my interpretation of imaging: Chest x-ray with area read as atelectasis to the middle lobe.  CTA with no signs of pulmonary embolism.  Questionable area of mucous plugging versus infiltrate  LABS (all labs ordered are listed, but only abnormal results are displayed) Labs interpreted as -    Labs Reviewed  CBC WITH DIFFERENTIAL/PLATELET - Abnormal; Notable for the following components:      Result Value   WBC 3.5 (*)    RBC 3.53 (*)    Hemoglobin 10.7 (*)    HCT 35.5 (*)    MCV 100.6 (*)    RDW 17.0 (*)    All other components within normal limits  COMPREHENSIVE METABOLIC PANEL WITH GFR - Abnormal; Notable for the following components:   Calcium  8.4 (*)    Total Protein 6.2 (*)    Albumin  3.3 (*)    All other components within normal limits  BRAIN NATRIURETIC PEPTIDE - Abnormal; Notable for the following components:   B Natriuretic Peptide 107.2 (*)    All other components within normal limits  BLOOD GAS, VENOUS - Abnormal; Notable for the following components:   pCO2, Ven 64 (*)    Bicarbonate 31.5 (*)    Acid-Base Excess 3.2 (*)    All other components within normal limits  BLOOD GAS, VENOUS - Abnormal; Notable for the following components:   pCO2, Ven 69 (*)    Bicarbonate 34.7 (*)    Acid-Base Excess 6.0 (*)    All other components within normal limits  TROPONIN I (HIGH SENSITIVITY) - Abnormal; Notable for the following components:   Troponin I (High Sensitivity) 18 (*)    All other components within normal limits  TROPONIN I (HIGH SENSITIVITY) - Abnormal; Notable for the following components:   Troponin I (High Sensitivity) 32 (*)    All other components within normal limits  CULTURE, BLOOD (ROUTINE X 2)  CULTURE, BLOOD (ROUTINE X 2)  MAGNESIUM   LACTIC  ACID, PLASMA  LACTIC ACID, PLASMA     MDM  Patient with an episode of atrial fibrillation with a rapid rate which self converted back to normal sinus rhythm.  Currently chest pain-free and with no shortness of breath.  On arrival afebrile, soft blood pressure.  Patient did take multiple doses of nitroglycerin  prior to arrival.  Likely the etiology of her low blood pressure.  Will give a 500 bolus and relax evaluate given her history of heart failure.  BNP 100 and lower than previous, no pulmonary edema on chest x-ray.  No significant anemia, hemoglobin 10.7 and had previously been 10.9.  Platelets within normal limits.  Leukopenia with WBCs of 3.5.   Clinical Course as of 07/15/24 1447  Mon Jul 15, 2024  1152 Patient with significant hypercarbia with a CO2 of 64 and a pH of 7.3.  Continues to have soft blood pressure.  Will give another 500 bolus of IV fluids.  Felt that 30 cc/kg of IV fluids would be detrimental to the patient given her history of heart failure.  Will place on BiPAP and give a DuoNeb treatment.  Plan for CTA to further evaluate for pneumonia, pulmonary embolism or other etiology of her chest pain. [SM]    Clinical Course User Index [SM] Suzanne Kirsch, MD   Patient not tolerating BiPAP and continues to take it off.  Given DuoNeb treatments and IV Solu-Medrol .  Given her questionable finding of pneumonia on CT scan and soft blood pressure we will do a second bolus of fluids of 500.  Started on antibiotics to cover for community-acquired pneumonia with Pseudomonas risk factors.  Plan to repeat VBG.  Delay in antibiotics secondary to difficult IV stick.  Consulted hospitalist for admission for COPD exacerbation, pneumonia and hypercarbia.  Repeating VBG now.  Repeat VBG continued to be hypercarbic.  Able to talk the patient into doing a trial of BiPAP and patient maintaining on BiPAP.  PROCEDURES:  Critical Care performed: yes  .Critical Care  Performed by: Suzanne Kirsch, MD Authorized by: Suzanne Kirsch, MD   Critical care provider statement:    Critical care time (minutes):  45   Critical care time was exclusive of:  Separately billable procedures and treating other patients   Critical care was time spent personally by me on the following activities:  Development of treatment plan with patient or surrogate, discussions with consultants, evaluation of patient's response to treatment, examination of patient, ordering and review of laboratory studies, ordering and review of radiographic studies, ordering and performing treatments and interventions, pulse oximetry, re-evaluation of patient's condition and review of old charts   Patient's presentation is most consistent with acute presentation with potential threat to life or bodily function.   MEDICATIONS ORDERED IN ED: Medications  lactated ringers  infusion (has no administration in time range)  azithromycin  (ZITHROMAX ) 500 mg in sodium chloride  0.9 % 250 mL IVPB (has no administration in time range)  sodium chloride  0.9 % bolus 500 mL (has no administration in time range)  sodium chloride  0.9 % bolus 500 mL (0 mLs Intravenous Stopped 07/15/24 1148)  ipratropium-albuterol  (DUONEB) 0.5-2.5 (3) MG/3ML nebulizer solution 6 mL (6 mLs Nebulization Given 07/15/24 1231)  methylPREDNISolone  sodium succinate (SOLU-MEDROL ) 125 mg/2 mL injection 125 mg (125 mg Intravenous Given 07/15/24 1149)  sodium chloride  0.9 % bolus 500 mL (500 mLs Intravenous New Bag/Given 07/15/24 1148)  iohexol  (OMNIPAQUE ) 350 MG/ML injection 75 mL (75 mLs Intravenous Contrast Given 07/15/24 1218)  ceFEPIme  (MAXIPIME ) 2 g in sodium chloride  0.9 % 100 mL IVPB (2 g Intravenous New Bag/Given 07/15/24 1353)    FINAL CLINICAL IMPRESSION(S) / ED DIAGNOSES   Final diagnoses:  Hypercarbia  COPD exacerbation (HCC)  Paroxysmal atrial fibrillation (HCC)  Elevated troponin     Rx / DC Orders  ED Discharge Orders     None        Note:  This  document was prepared using Dragon voice recognition software and may include unintentional dictation errors.   Suzanne Kirsch, MD 07/15/24 1345    Suzanne Kirsch, MD 07/15/24 1447

## 2024-07-15 NOTE — Progress Notes (Signed)
 Placed on bipap for only , pt stated to take it off and she could not wear. RN and MD notified.

## 2024-07-15 NOTE — ED Notes (Signed)
 Called CCMD for central monitoring at this time

## 2024-07-15 NOTE — Consult Note (Signed)
 CODE SEPSIS - PHARMACY COMMUNICATION  **Broad Spectrum Antibiotics should be administered within 1 hour of Sepsis diagnosis**  Time Code Sepsis Called/Page Received: 1324  Antibiotics Ordered: cefepime   Time of 1st antibiotic administration: 1353  Additional action taken by pharmacy: none      Cathaleen GORMAN Blanch ,PharmD Clinical Pharmacist  07/15/2024  2:48 PM

## 2024-07-15 NOTE — ED Notes (Signed)
 Patient attempting bi pap mask at this time. Hospitalist at bedside at this time

## 2024-07-16 ENCOUNTER — Other Ambulatory Visit: Payer: Self-pay

## 2024-07-16 ENCOUNTER — Other Ambulatory Visit (HOSPITAL_COMMUNITY): Payer: Self-pay

## 2024-07-16 ENCOUNTER — Encounter: Payer: Self-pay | Admitting: Internal Medicine

## 2024-07-16 ENCOUNTER — Other Ambulatory Visit

## 2024-07-16 DIAGNOSIS — J9612 Chronic respiratory failure with hypercapnia: Secondary | ICD-10-CM | POA: Diagnosis not present

## 2024-07-16 DIAGNOSIS — J9601 Acute respiratory failure with hypoxia: Secondary | ICD-10-CM

## 2024-07-16 DIAGNOSIS — J441 Chronic obstructive pulmonary disease with (acute) exacerbation: Secondary | ICD-10-CM | POA: Diagnosis not present

## 2024-07-16 DIAGNOSIS — I4891 Unspecified atrial fibrillation: Secondary | ICD-10-CM | POA: Diagnosis not present

## 2024-07-16 DIAGNOSIS — F17200 Nicotine dependence, unspecified, uncomplicated: Secondary | ICD-10-CM | POA: Diagnosis not present

## 2024-07-16 DIAGNOSIS — J189 Pneumonia, unspecified organism: Secondary | ICD-10-CM | POA: Diagnosis not present

## 2024-07-16 LAB — COMPREHENSIVE METABOLIC PANEL WITH GFR
ALT: 13 U/L (ref 0–44)
AST: 23 U/L (ref 15–41)
Albumin: 2.8 g/dL — ABNORMAL LOW (ref 3.5–5.0)
Alkaline Phosphatase: 35 U/L — ABNORMAL LOW (ref 38–126)
Anion gap: 8 (ref 5–15)
BUN: 11 mg/dL (ref 8–23)
CO2: 28 mmol/L (ref 22–32)
Calcium: 7.8 mg/dL — ABNORMAL LOW (ref 8.9–10.3)
Chloride: 105 mmol/L (ref 98–111)
Creatinine, Ser: 0.7 mg/dL (ref 0.44–1.00)
GFR, Estimated: >60 mL/min (ref >60)
Glucose, Bld: 146 mg/dL — ABNORMAL HIGH (ref 70–99)
Potassium: 4.3 mmol/L (ref 3.5–5.1)
Sodium: 141 mmol/L (ref 135–145)
Total Bilirubin: 0.4 mg/dL (ref 0.0–1.2)
Total Protein: 5.7 g/dL — ABNORMAL LOW (ref 6.5–8.1)

## 2024-07-16 LAB — BLOOD GAS, VENOUS
Bicarbonate: 34.7 mmol/L — ABNORMAL HIGH (ref 20.0–28.0)
O2 Saturation: 25.2 mmol/L — AB (ref 0.0–2.0)
Patient temperature: 37
Patient temperature: 37
pCO2, Ven: 69 mmHg — ABNORMAL HIGH (ref 44–60)
pH, Ven: 7.31 (ref 7.25–7.43)
pO2, Ven: 34.7 mmol/L — AB (ref 32–45)

## 2024-07-16 LAB — CBC
HCT: 30.8 % — ABNORMAL LOW (ref 36.0–46.0)
Hemoglobin: 9.3 g/dL — ABNORMAL LOW (ref 12.0–15.0)
MCH: 30.3 pg (ref 26.0–34.0)
MCHC: 30.2 g/dL (ref 30.0–36.0)
MCV: 100.3 fL — ABNORMAL HIGH (ref 80.0–100.0)
Platelets: 227 K/uL (ref 150–400)
RBC: 3.07 MIL/uL — ABNORMAL LOW (ref 3.87–5.11)
RDW: 17.2 % — ABNORMAL HIGH (ref 11.5–15.5)
WBC: 3.6 K/uL — ABNORMAL LOW (ref 4.0–10.5)
nRBC: 0 % (ref 0.0–0.2)

## 2024-07-16 MED ORDER — PREDNISONE 10 MG PO TABS
ORAL_TABLET | ORAL | 0 refills | Status: AC
  Filled 2024-07-16: qty 30, 12d supply, fill #0

## 2024-07-16 MED ORDER — POLYVINYL ALCOHOL 1.4 % OP SOLN
1.0000 [drp] | OPHTHALMIC | Status: DC | PRN
  Administered 2024-07-16: 1 [drp] via OPHTHALMIC
  Filled 2024-07-16: qty 15

## 2024-07-16 MED ORDER — SALINE SPRAY 0.65 % NA SOLN
1.0000 | Freq: Once | NASAL | Status: AC
  Administered 2024-07-16: 1 via NASAL
  Filled 2024-07-16: qty 44

## 2024-07-16 MED ORDER — AZITHROMYCIN 500 MG PO TABS
500.0000 mg | ORAL_TABLET | Freq: Every day | ORAL | 0 refills | Status: AC
  Filled 2024-07-16: qty 3, 3d supply, fill #0

## 2024-07-16 MED ORDER — IPRATROPIUM-ALBUTEROL 0.5-2.5 (3) MG/3ML IN SOLN: 3.0000 mL | RESPIRATORY_TRACT | Status: DC

## 2024-07-16 MED ORDER — AMOXICILLIN-POT CLAVULANATE 875-125 MG PO TABS
1.0000 | ORAL_TABLET | Freq: Two times a day (BID) | ORAL | 0 refills | Status: DC
  Filled 2024-07-16: qty 10, 5d supply, fill #0

## 2024-07-16 MED ORDER — BUDESONIDE 0.5 MG/2ML IN SUSP
0.5000 mg | Freq: Two times a day (BID) | RESPIRATORY_TRACT | Status: DC
  Administered 2024-07-16: 0.5 mg via RESPIRATORY_TRACT
  Filled 2024-07-16: qty 2

## 2024-07-16 NOTE — Plan of Care (Signed)
 Continues to improve  Will sign off at this time. No further recommendations at this time.   Nickolas Alm Cellar, M.D.  [facility]  Medical Director [facility] Medical Director [facility] Cardio-Pulmonary Department

## 2024-07-16 NOTE — ED Notes (Signed)
 Dr Arlon at bedside to speak with pt/family.

## 2024-07-16 NOTE — ED Notes (Signed)
Pt given phone to make a call 

## 2024-07-16 NOTE — Plan of Care (Signed)
                                                     Palliative Care Progress Note   Patient Name: Stacey Sandoval       Date: 07/16/2024 DOB: 1951/01/01  Age: 73 y.o. MRN#: [id number] Attending Physician: Arlon Carliss ORN, DO Primary Care Physician: [facility] Admit Date: 07/15/2024  Extensive chart review completed including labs, vital signs, imaging, progress notes, orders, and available advanced directive documents from current and previous encounters.   Consult received at 0926. Pt discharged home at 1006. PMT was unable to meet with or initiate GOC discussions prior to discharge given short time frame.   Thank you for allowing the Palliative Medicine Team to assist in the care of Stacey Sandoval.  Lamarr L. Arvid, DNP, FNP-BC Palliative Medicine Team  No charge

## 2024-07-16 NOTE — ED Notes (Signed)
 Pt up to bathroom, steady gait, one person assist for safety. Pt returned to bed, connected to monitor. Pt left of RA at this time, O2 at 98%.

## 2024-07-16 NOTE — Discharge Summary (Signed)
 Physician Discharge Summary   Patient: Stacey Sandoval MRN: [id number] DOB: 08-01-1951  Admit date:     07/15/2024  Discharge date: 07/16/24  Discharge Physician: Carliss LELON Canales   PCP: [facility]   Recommendations at discharge:    Pt to be discharged home.   If you experience worsening fever, chills, chest pain, shortness of breath, or other concerning symptoms, please call your PCP or go to the emergency department immediately.  Discharge Diagnoses: Principal Problem:   Respiratory failure (HCC)  Resolved Problems:   * No resolved hospital problems. *   Hospital Course:   73 y.o. female with medical history significant of Atrial fibrillation,CHFpef, HLD, HTN, COPD,Tobacco  with interim history of admission 06/12/2024 at which time patient was found to have NSTEMI for which she underwent cath noting severe RCA stenosis as well as mod multivessel disease.  Patient was then transferred to [facility] and under went PCI with DES to RCA . Patient now presents to ED with complaint of chest pain.  Patient in the field was found to have  afib with rvr to 140's but converted to sinus on her own. Patient of note had taken multiple doses of nitroglycerin  prior to EMS arrival.  Patient of note after conversion to sinus noted decrease chest pain. Patient noted no associated fever/chills/ cough/ presyncope/ n/v/d or dysuria or feeling ill prior to onset of symptoms. Patient is alert and oriented to place time and situation. He daughter is at bedside.  Patient noted her breathing is much improved  and notes no current chest pain .  She also notes she does not want to where bipap , she also notes her wish of DNI/DNR.  Patient's daughter states mother has expressed wishes for DNI/DNR in the past. She also states that patient at times will refuse intervention but will eventually be accepting of treatment. Discussed that since patient was able to make he out decisions and was Alert and Oriented and  make he wishes and need known with would have to have abide by her wishes.   Assessment and Plan:  Acute on chronic hypoxic hypercapnic respiratory failure - Mildly elevated pCO2 69 on presentation with pH 7.3.  Was initially placed on BiPAP however patient refused and was unable to tolerate further treatment.  Per the course hospital stay hypoxia and hypercapnia resolved.  Patient breathing comfortably on room air, O2 sats in the low 90s (patient baseline 88-92).  Patient ambulatory and eager for discharge home.  Patient has home O2 to be used within exertion.  Recommend follow-up with PCP in 1-2 week.  Acute exacerbation of chronic COPD - Likely contributing to above.  Responded well to supplemental O2, IV Solu-Medrol , nebulizers.  Will transition patient to p.o. prednisone  taper to take as directed upon discharge.  Possible community-acquired pneumonia - Mild right lower lobe opacities on chest CT.  Empiric antibiotics on board.  Will transition patient to p.o. Augmentin  plus azithromycin  to take as directed upon discharge.  Atrial fibrillation with RVR - Exacerbated by above.  Spontaneously converted to NSR without intervention.  Continue home medication regimen including Eliquis , Cardizem .  History of CAD s/p NSTEMI and drug-eluting stent - Resume Plavix , Eliquis .  HFpEF - Does not appear to be in acute exacerbation.  Resume home medication regimen.  Tobacco abuse - Encourage cessation.    Consultants: PCCM Procedures performed: None Disposition: Home Diet recommendation:  Discharge Diet Orders (From admission, onward)     Start     Ordered  07/16/24 0000  Diet - low sodium heart healthy        07/16/24 1005           Cardiac diet  DISCHARGE MEDICATION: Allergies as of 07/16/2024       Reactions   Dextromethorphan -guaifenesin  Shortness Of Breath, Anxiety, Other (See Comments)   Globus pharyngeus   Codeine    Other Reaction(s): sedation   Ibuprofen    Other  Reaction(s): GI upset   Trazodone Nausea Only   Zolpidem Nausea Only        Medication List     TAKE these medications    acetaminophen  650 MG CR tablet Commonly known as: TYLENOL  Take 1,300 mg by mouth 2 (two) times daily.   albuterol  (2.5 MG/3ML) 0.083% nebulizer solution Commonly known as: PROVENTIL  Take 3 mLs (2.5 mg total) by nebulization every 6 (six) hours as needed for wheezing or shortness of breath.   amoxicillin -clavulanate 875-125 MG tablet Commonly known as: AUGMENTIN  Take 1 tablet by mouth 2 (two) times daily.   apixaban  5 MG Tabs tablet Commonly known as: Eliquis  Take 1 tablet (5 mg total) by mouth 2 (two) times daily.   atorvastatin  40 MG tablet Commonly known as: LIPITOR Take 1 tablet (40 mg total) by mouth daily.   azithromycin  500 MG tablet Commonly known as: Zithromax  Take 1 tablet (500 mg total) by mouth daily for 3 days.   bisoprolol  5 MG tablet Commonly known as: ZEBETA  TAKE 1 TABLET BY MOUTH EVERY DAY   Breztri  Aerosphere 160-9-4.8 MCG/ACT Aero inhaler Generic drug: budesonide -glycopyrrolate -formoterol  Inhale 2 puffs into the lungs in the morning and at bedtime.   calcitRIOL 0.25 MCG capsule Commonly known as: ROCALTROL Take 0.25 mcg by mouth 2 (two) times daily.   clopidogrel  75 MG tablet Commonly known as: PLAVIX  Take 1 tablet (75 mg total) by mouth daily.   diltiazem  180 MG 24 hr capsule Commonly known as: CARDIZEM  CD TAKE 1 CAPSULE(180 MG) BY MOUTH DAILY   diltiazem  30 MG tablet Commonly known as: Cardizem  Take 1 tablet (30 mg total) by mouth as needed (atrial fibrillation).   Farxiga  10 MG Tabs tablet Generic drug: dapagliflozin  propanediol Take 1 tablet (10 mg total) by mouth daily.   FeroSul 325 (65 Fe) MG tablet Generic drug: ferrous sulfate  Take 1 tablet (325 mg total) by mouth in the morning and at bedtime.   furosemide  20 MG tablet Commonly known as: Lasix  Take 1 tablet (20 mg total) by mouth daily as needed.    gabapentin  300 MG capsule Commonly known as: NEURONTIN  Take 300 mg every am & 600 in the pm   levothyroxine  100 MCG tablet Commonly known as: SYNTHROID  Take 100 mcg by mouth daily before breakfast.   mirtazapine  45 MG tablet Commonly known as: REMERON  Take 45 mg by mouth at bedtime.   nitroGLYCERIN  0.4 MG SL tablet Commonly known as: NITROSTAT  Place 1 tablet (0.4 mg total) under the tongue every 5 (five) minutes x 3 doses as needed for chest pain.   OXYGEN  Inhale into the lungs as needed. 2-3 Liters   pantoprazole  40 MG tablet Commonly known as: PROTONIX  Take 1 tablet (40 mg total) by mouth daily.   potassium chloride  SA 20 MEQ tablet Commonly known as: KLOR-CON  M Take 20 mEq by mouth 4 (four) times daily as needed (prn).   predniSONE  10 MG tablet Commonly known as: DELTASONE  Take 4 tablets (40 mg total) by mouth daily for 3 days, THEN 3 tablets (30 mg total) daily for  3 days, THEN 2 tablets (20 mg total) daily for 3 days, THEN 1 tablet (10 mg total) daily for 3 days. Start taking on: July 16, 2024   spironolactone  25 MG tablet Commonly known as: ALDACTONE  Take 1 tablet (25 mg total) by mouth daily.   triamcinolone ointment 0.5 % Commonly known as: KENALOG Apply 1 Application topically 2 (two) times daily.         Discharge Exam: Filed Weights   07/15/24 0924  Weight: 68 kg    GENERAL:  Alert, pleasant, no acute distress  HEENT:  EOMI CARDIOVASCULAR:  RRR, no murmurs appreciated RESPIRATORY:  C poor air movement bilaterally GASTROINTESTINAL:  Soft, nontender, nondistended EXTREMITIES:  No LE edema bilaterally NEURO:  No new focal deficits appreciated SKIN:  No rashes noted PSYCH:  Appropriate mood and affect     Condition at discharge: improving  The results of significant diagnostics from this hospitalization (including imaging, microbiology, ancillary and laboratory) are listed below for reference.   Imaging Studies: CT Angio Chest Pulmonary  Embolism (PE) W or WO Contrast Result Date: 07/15/2024 CLINICAL DATA:  Pulmonary embolism (PE) suspected, high prob. Chest pain. EXAM: CT ANGIOGRAPHY CHEST WITH CONTRAST TECHNIQUE: Multidetector CT imaging of the chest was performed using the standard protocol during bolus administration of intravenous contrast. Multiplanar CT image reconstructions and MIPs were obtained to evaluate the vascular anatomy. RADIATION DOSE REDUCTION: This exam was performed according to the departmental dose-optimization program which includes automated exposure control, adjustment of the mA and/or kV according to patient size and/or use of iterative reconstruction technique. CONTRAST:  75mL OMNIPAQUE  IOHEXOL  350 MG/ML SOLN COMPARISON:  06/12/2024 FINDINGS: Cardiovascular: No filling defects in the pulmonary arteries to suggest pulmonary emboli. Heart is mildly enlarged. Diffuse 3 vessel coronary artery disease and moderate aortic atherosclerosis. No evidence of aortic aneurysm. Mediastinum/Nodes: No mediastinal, hilar, or axillary adenopathy. Trachea and esophagus are unremarkable. Thyroid  unremarkable. Lungs/Pleura: Stable scarring in the medial left apex. Scarring in the lingula. Dependent atelectasis in the lower lobes and lung bases. Bilateral lower lobe airway thickening. Mucous plugging within the right lower lobe and right middle lobe. Air bronchograms noted again in the right middle lobe at the lung base, most likely atelectasis. No effusions. Upper Abdomen: No acute findings. Calcifications throughout the spleen compatible with old granulomatous disease. Musculoskeletal: Chest wall soft tissues are unremarkable. No acute bony abnormality. Review of the MIP images confirms the above findings. IMPRESSION: No evidence of pulmonary embolus. Diffuse 3 vessel coronary artery disease. Airway thickening and mucous plugging in the lower lungs. Air bronchograms again noted in the right middle lobe at the right lung base, favor  atelectasis although infiltrate cannot be completely excluded. Aortic Atherosclerosis (ICD10-I70.0). Electronically Signed   By: Franky Crease M.D.   On: 07/15/2024 12:34   DG Chest 2 View Result Date: 07/15/2024 CLINICAL DATA:  chest pain. EXAM: CHEST - 2 VIEW COMPARISON:  06/20/2024. FINDINGS: Since the prior study, there is new area of atelectasis in the middle lobe. Bilateral lung fields are otherwise clear. No acute consolidation or lung collapse. Note is made of elevated right hemidiaphragm. Bilateral costophrenic angles are clear. Stable cardio-mediastinal silhouette. No acute osseous abnormalities. The soft tissues are within normal limits. IMPRESSION: New area of atelectasis in the middle lobe. Otherwise no acute cardiopulmonary abnormality. Electronically Signed   By: Ree Molt M.D.   On: 07/15/2024 10:24   DG Chest Port 1 View Result Date: 06/20/2024 CLINICAL DATA:  Acute respiratory distress. EXAM: PORTABLE CHEST 1  VIEW COMPARISON:  Chest CT 06/12/2024 and chest x-ray 06/18/1999. FINDINGS: The cardiac silhouette, mediastinal and contours are within normal limits and stable. Stable tortuosity and calcification of the thoracic aorta. Bilateral pleural effusions, slightly enlarged since the prior chest x-ray. The vascular congestion and mild interstitial edema has largely resolved. Bibasilar scarring and atelectasis but no definite infiltrates. No pneumothorax. IMPRESSION: 1. Bilateral pleural effusions, slightly enlarged since the prior chest x-ray. 2. Resolution of vascular congestion and interstitial edema. 3. Bibasilar scarring and atelectasis. Electronically Signed   By: MYRTIS Stammer M.D.   On: 06/20/2024 22:55   CARDIAC CATHETERIZATION Result Date: 06/18/2024 Images from the original result were not included.   Lesion Segment #1 prox RCA to Mid RCA lesion is 85% stenosed with 70% stenosed side branch in Acute Mrg. Mid RCA lesion is 50% stenosed.   Orbital atherectomy followed by OCT  performed to ensure adequate stent coverage and expansion.   A drug-eluting stent was successfully placed covering the significant 85% lesion and the more distal 50% lesion, using a STENT SYNERGY XD 3.0X32.  Stent was postdilated to 3.3 mm. Post intervention, there is a 0% residual stenosis.  TIMI-3 flow maintained. Post intervention, the side branch was reduced to 60% residual stenosis.   Lesion #2: Prox RCA lesion is 95% stenosed.   -------------------------------------------------------------------------   Orbital atherectomy followed by OCT performed to ensure adequate stent coverage and expansion.   A drug-eluting stent was successfully placed using a STENT SYNERGY XD 4.0X28. Stent was postdilated in tapered fashion from 4.6 to 4.1 mm; Post intervention, there is a 0% residual stenosis. TIMI-3 flow maintained   ------------------------------------------------------------------------- Diagnostic  Dominance: Right     Intervention  Successful OCT guided CSI orbital atherectomy based DES PCI of the ostial to distal RCA treated from significant calcified lesions and placing 2 overlapping Synergy XD DES stents: 3.0 mm x 32 mm postdilated to 3.3 mm, with a proximal overlapping 4.0 mm x 28 mm stent deployed to 4.1 mm and postdilated proximally to 4.5 mm based on OCT. Transient hypotension during intervention requiring Levophed  that was easily weaned off upon completion of the procedure. Baseline hypoxia and hypercapnia with oxygen  saturations in the high 80s with blood gas PCO2 in the 70s with pH roughly 7.3. RECOMMENDATIONS   In the absence of any other complications or medical issues, we expect the patient to be ready for discharge from an interventional cardiology perspective on 06/19/2024.   Recommend to resume Apixaban , at currently prescribed dose and frequency on 06/19/2024.   Recommend concurrent antiplatelet therapy of Aspirin  81 mg for 1 month and Clopidogrel  75mg  daily for 12 months .   Will restart IV heparin   overnight tonight and then initiate DOAC on 06/19/2024 Alm Clay, MD     DG CHEST PORT 1 VIEW Result Date: 06/17/2024 CLINICAL DATA:  Dyspnea. EXAM: PORTABLE CHEST 1 VIEW COMPARISON:  06/15/2024. FINDINGS: Stable cardiomediastinal contours. Aortic atherosclerosis. Persistent vascular congestion with similar bilateral diffuse interstitial prominence, suggestive of pulmonary interstitial edema. Similar small bilateral pleural effusions. No pneumothorax. Visualized osseous structures are unchanged. IMPRESSION: 1. Persistent vascular congestion with similar bilateral diffuse interstitial prominence, suggestive of pulmonary interstitial edema. 2. Similar small bilateral pleural effusions. Electronically Signed   By: Harrietta Sherry M.D.   On: 06/17/2024 09:00    Microbiology: Results for orders placed or performed during the hospital encounter of 07/15/24  Blood culture (routine x 2)     Status: None (Preliminary result)   Collection Time: 07/15/24  1:46  PM   Specimen: BLOOD  Result Value Ref Range Status   Specimen Description BLOOD RIGHT ARM  Final   Special Requests   Final    BOTTLES DRAWN AEROBIC AND ANAEROBIC Blood Culture adequate volume   Culture   Final    NO GROWTH < 24 HOURS Performed at [facility], [address] [place], [place] [address]    Report Status PENDING  Incomplete  Blood culture (routine x 2)     Status: None (Preliminary result)   Collection Time: 07/15/24  1:46 PM   Specimen: BLOOD  Result Value Ref Range Status   Specimen Description BLOOD LEFT ARM  Final   Special Requests   Final    BOTTLES DRAWN AEROBIC AND ANAEROBIC Blood Culture results may not be optimal due to an inadequate volume of blood received in culture bottles   Culture   Final    NO GROWTH < 24 HOURS Performed at [facility], [address] [place], [place] [address]    Report Status PENDING  Incomplete  Respiratory (~20 pathogens) panel by PCR     Status: None    Collection Time: 07/15/24  3:30 PM   Specimen: Peripheral; Respiratory  Result Value Ref Range Status   Adenovirus NOT DETECTED NOT DETECTED Final   Coronavirus 229E NOT DETECTED NOT DETECTED Final    Comment: (NOTE) The Coronavirus on the Respiratory Panel, DOES NOT test for the novel  Coronavirus (2019 nCoV)    Coronavirus HKU1 NOT DETECTED NOT DETECTED Final   Coronavirus NL63 NOT DETECTED NOT DETECTED Final   Coronavirus OC43 NOT DETECTED NOT DETECTED Final   Metapneumovirus NOT DETECTED NOT DETECTED Final   Rhinovirus / Enterovirus NOT DETECTED NOT DETECTED Final   Influenza A NOT DETECTED NOT DETECTED Final   Influenza B NOT DETECTED NOT DETECTED Final   Parainfluenza Virus 1 NOT DETECTED NOT DETECTED Final   Parainfluenza Virus 2 NOT DETECTED NOT DETECTED Final   Parainfluenza Virus 3 NOT DETECTED NOT DETECTED Final   Parainfluenza Virus 4 NOT DETECTED NOT DETECTED Final   Respiratory Syncytial Virus NOT DETECTED NOT DETECTED Final   Bordetella pertussis NOT DETECTED NOT DETECTED Final   Bordetella Parapertussis NOT DETECTED NOT DETECTED Final   Chlamydophila pneumoniae NOT DETECTED NOT DETECTED Final   Mycoplasma pneumoniae NOT DETECTED NOT DETECTED Final    Comment: Performed at [facility], [address] [place], [place] [address]  MRSA Next Gen by PCR, Nasal     Status: Abnormal   Collection Time: 07/15/24  3:52 PM  Result Value Ref Range Status   MRSA by PCR Next Gen DETECTED (A) NOT DETECTED Final    Comment: RESULT CALLED TO, READ BACK BY AND VERIFIED WITH: BETHANY SIMPSON 2000 07/15/24 MU (NOTE) The GeneXpert MRSA Assay (FDA approved for NASAL specimens only), is one component of a comprehensive MRSA colonization surveillance program. It is not intended to diagnose MRSA infection nor to guide or monitor treatment for MRSA infections. Test performance is not FDA approved in patients less than 83 years old. Performed at [facility], [address] [place], [place] [address]     Labs: CBC: Recent Labs  Lab 07/12/24 1449 07/15/24 0929 07/16/24 0448  WBC 5.4 3.5* 3.6*  NEUTROABS  --  1.9  --   HGB 10.9* 10.7* 9.3*  HCT 35.6 35.5* 30.8*  MCV 98* 100.6* 100.3*  PLT 307 230 227   Basic Metabolic Panel: Recent Labs  Lab 07/15/24  9070 07/16/24 0448  NA 139 141  K 3.6 4.3  CL 98 105  CO2 28 28  GLUCOSE 97 146*  BUN 12 11  CREATININE 0.77 0.70  CALCIUM  8.4* 7.8*  MG 1.7  --    Liver Function Tests: Recent Labs  Lab 07/15/24 0929 07/16/24 0448  AST 25 23  ALT 14 13  ALKPHOS 39 35*  BILITOT 0.5 0.4  PROT 6.2* 5.7*  ALBUMIN  3.3* 2.8*   CBG: No results for input(s): GLUCAP in the last 168 hours.  Discharge time spent: 31 minutes.  Length of inpatient stay: 1 days  Signed: Carliss LELON Canales, DO [facility] 07/16/2024

## 2024-07-17 ENCOUNTER — Other Ambulatory Visit (HOSPITAL_COMMUNITY): Payer: Self-pay

## 2024-07-17 ENCOUNTER — Other Ambulatory Visit: Payer: Self-pay

## 2024-07-17 ENCOUNTER — Ambulatory Visit

## 2024-07-17 LAB — T4: T4, Total: 5.8 ug/dL (ref 4.5–12.0)

## 2024-07-17 LAB — T3: T3, Total: 66 ng/dL — ABNORMAL LOW (ref 71–180)

## 2024-07-17 LAB — LEGIONELLA PNEUMOPHILA SEROGP 1 UR AG: L. pneumophila Serogp 1 Ur Ag: NEGATIVE

## 2024-07-17 MED ORDER — SPIRONOLACTONE 25 MG PO TABS: 25.0000 mg | ORAL_TABLET | Freq: Every day | ORAL | 3 refills | Status: AC

## 2024-07-20 LAB — CULTURE, BLOOD (ROUTINE X 2)
Culture: NO GROWTH
Culture: NO GROWTH
Special Requests: ADEQUATE

## 2024-07-22 ENCOUNTER — Ambulatory Visit

## 2024-07-24 ENCOUNTER — Ambulatory Visit

## 2024-07-24 ENCOUNTER — Telehealth: Payer: Self-pay | Admitting: *Deleted

## 2024-07-24 NOTE — Telephone Encounter (Signed)
 Catalaya had come for her orientation but has not returned to start her first day of class. A call was made to follow up. I spoke with Twilla Booker daughter who informed us  that she was in the hospital and now is home recovering from her hospital stay. She stated she would call back when her mother was ready to re-start the program. Suanne was put on a medical hold. Follow up again if we do not hear from them in 3 weeks.

## 2024-07-25 ENCOUNTER — Telehealth: Payer: Self-pay | Admitting: Family

## 2024-07-25 NOTE — Telephone Encounter (Signed)
 Called to confirm/remind patient of [facility] on 07/26/[facility].   Appointment:   [] Confirmed  [x] Left mess   [] No answer/No voice mail  [] VM Full/unable to leave message  [] Phone not in service  Patient reminded to bring all medications and/or complete list.  Confirmed patient has transportation. Gave directions, instructed to utilize valet parking.

## 2024-07-25 NOTE — Progress Notes (Deleted)
 Advanced Heart Failure Clinic Note   Referring Physician: admission PCP: [facility] Cardiologist: Evalene Lunger, MD   Chief Complaint: fatigue   HPI:  Stacey Sandoval is a 73 y/o female with a history of paroxysmal atrial fibrillation, COPD, HFpEF, current smoker x 40+ years, NSTEMI (07/25), enlarged thyroid , hyperlipidemia, AVM (arteriovenous malformation) of small bowel, anemia, COPD and HTN.   Admitted 06/12/24 with acute chest pain that woke her from sleep. Radiation to left side of neck and arm. Troponin up trended from 25 -->479->707 and the ER initiated a heparin  drip. CTA of the chest was negative for PE. Found to have NSTEMI. Echo 06/12/24: EF 60-65%, mild LVH, G2DD, normal RV, mild MR. Taken for [facility] 06/13/24 revealing widespread stenosis in the RCA. Load with antiplatelets therapy restart IV heparin  2 hours post TR band removal. Cardiology has arranged transfer to [facility] with plan for atherectomy-based PCI to the RCA.   Admitted 06/13/24 from OSH for atherectomy and PCI. Initially planned for 7/7 but was significantly dyspneic and tachycardic on exam having difficulty lying flat. Underwent cardiac catheterization 7/8 with successful OTC guided CSI orbital atherectomy with DES x2. Recommendations initially for DAPT with aspirin /Plavix  for 1 month, given the need for Eliquis . Then plan to drop aspirin  after 30 days. With GI bleed, will plan to stop ASA once Eliquis  resumed and continue plavix . Developed episode of melena with large black tarry bowel movement evening of 7/8. 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 IV diuresed.   She presents today, with her daughter, for her initial HF visit with a chief complaint of fatigue (improving). Has associated pedal edema. Taking lasix  as needed and has taken it twice this week. Sleeping well on 2 pillows. Wearing oxygen  at 2L around the clock.   Review  of Systems: [y] = yes, [ ]  = no   General: Weight gain [ ] ; Weight loss [ ] ; Anorexia [ ] ; Fatigue Davis.Dad ]; Fever [ ] ; Chills [ ] ; Weakness [ ]   Cardiac: Chest pain/pressure [ ] ; Resting SOB [ ] ; Exertional SOB [ ] ; Orthopnea [ ] ; Pedal Edema Davis.Dad ]; Palpitations [ ] ; Syncope [ ] ; Presyncope [ ] ; Paroxysmal nocturnal dyspnea[ ]   Pulmonary: Cough [ ] ; Wheezing[ ] ; Hemoptysis[ ] ; Sputum [ ] ; Snoring [ ]   GI: Vomiting[ ] ; Dysphagia[ ] ; Melena[ ] ; Hematochezia [ ] ; Heartburn[ ] ; Abdominal pain [ ] ; Constipation [ ] ; Diarrhea [ ] ; BRBPR [ ]   GU: Hematuria[ ] ; Dysuria [ ] ; Nocturia[ ]   Vascular: Pain in legs with walking [ ] ; Pain in feet with lying flat [ ] ; Non-healing sores [ ] ; Stroke [ ] ; TIA [ ] ; Slurred speech [ ] ;  Neuro: Headaches[ ] ; Vertigo[ ] ; Seizures[ ] ; Paresthesias[ ] ;Blurred vision [ ] ; Diplopia [ ] ; Vision changes [ ]   Ortho/Skin: Arthritis [ ] ; Joint pain [ ] ; Muscle pain [ ] ; Joint swelling [ ] ; Back Pain [ ] ; Rash [ ]   Psych: Depression[ ] ; Anxiety[ ]   Heme: Bleeding problems [ ] ; Clotting disorders [ ] ; Anemia Davis.Dad ]  Endocrine: Diabetes [ ] ; Thyroid  dysfunction[ ]    Past Medical History:  Diagnosis Date   Atrial fibrillation (HCC)    CHF (congestive heart failure) (HCC)    Complication of anesthesia    VERY slow to wake after hysterectomy   COPD (chronic obstructive pulmonary disease) (HCC)    Enlarged thyroid     has been [facility]  Hyperlipidemia    Hypertension    Smokers' cough (HCC)    Tobacco use    Wears dentures    Full upper and lower    Current Outpatient Medications  Medication Sig Dispense Refill   acetaminophen  (TYLENOL ) 650 MG CR tablet Take 1,300 mg by mouth 2 (two) times daily.     albuterol  (PROVENTIL ) (2.5 MG/3ML) 0.083% nebulizer solution Take 3 mLs (2.5 mg total) by nebulization every 6 (six) hours as needed for wheezing or shortness of breath. 75 mL 2   amoxicillin -clavulanate (AUGMENTIN ) 875-125 MG tablet Take 1 tablet by mouth 2 (two) times  daily. 10 tablet 0   apixaban  (ELIQUIS ) 5 MG TABS tablet Take 1 tablet (5 mg total) by mouth 2 (two) times daily.     atorvastatin  (LIPITOR) 40 MG tablet Take 1 tablet (40 mg total) by mouth daily.     bisoprolol  (ZEBETA ) 5 MG tablet TAKE 1 TABLET BY MOUTH EVERY DAY 90 tablet 3   Budeson-Glycopyrrol-Formoterol  (BREZTRI  AEROSPHERE) 160-9-4.8 MCG/ACT AERO Inhale 2 puffs into the lungs in the morning and at bedtime. 5.9 g 0   calcitRIOL (ROCALTROL) 0.25 MCG capsule Take 0.25 mcg by mouth 2 (two) times daily.     clopidogrel  (PLAVIX ) 75 MG tablet Take 1 tablet (75 mg total) by mouth daily. 90 tablet 2   dapagliflozin  propanediol (FARXIGA ) 10 MG TABS tablet Take 1 tablet (10 mg total) by mouth daily. 90 tablet 1   diltiazem  (CARDIZEM  CD) 180 MG 24 hr capsule TAKE 1 CAPSULE(180 MG) BY MOUTH DAILY 90 capsule 2   diltiazem  (CARDIZEM ) 30 MG tablet Take 1 tablet (30 mg total) by mouth as needed (atrial fibrillation). 30 tablet 3   Ferrous Sulfate  (IRON ) 325 (65 Fe) MG TABS Take 1 tablet (325 mg total) by mouth in the morning and at bedtime. 60 tablet 0   furosemide  (LASIX ) 20 MG tablet Take 1 tablet (20 mg total) by mouth daily as needed. 30 tablet 2   gabapentin  (NEURONTIN ) 300 MG capsule Take 300 mg every am & 600 in the pm     levothyroxine  (SYNTHROID ) 100 MCG tablet Take 100 mcg by mouth daily before breakfast.     mirtazapine  (REMERON ) 45 MG tablet Take 45 mg by mouth at bedtime.     nitroGLYCERIN  (NITROSTAT ) 0.4 MG SL tablet Place 1 tablet (0.4 mg total) under the tongue every 5 (five) minutes x 3 doses as needed for chest pain. 25 tablet 1   OXYGEN  Inhale into the lungs as needed. 2-3 Liters     pantoprazole  (PROTONIX ) 40 MG tablet Take 1 tablet (40 mg total) by mouth daily. 90 tablet 3   potassium chloride  SA (KLOR-CON  M) 20 MEQ tablet Take 20 mEq by mouth 4 (four) times daily as needed (prn).     predniSONE  (DELTASONE ) 10 MG tablet Take 4 tablets (40 mg total) by mouth daily for 3 days, THEN 3  tablets (30 mg total) daily for 3 days, THEN 2 tablets (20 mg total) daily for 3 days, THEN 1 tablet (10 mg total) daily for 3 days. 30 tablet 0   spironolactone  (ALDACTONE ) 25 MG tablet Take 1 tablet (25 mg total) by mouth daily. 90 tablet 3   triamcinolone ointment (KENALOG) 0.5 % Apply 1 Application topically 2 (two) times daily.     No current facility-administered medications for this visit.    Allergies  Allergen Reactions   Dextromethorphan -Guaifenesin  Shortness Of Breath, Anxiety and Other (See Comments)    Globus pharyngeus  Codeine     Other Reaction(s): sedation   Ibuprofen     Other Reaction(s): GI upset   Trazodone Nausea Only   Zolpidem Nausea Only      Social History   Socioeconomic History   Marital status: Widowed    Spouse name: Not on file   Number of children: 6   Years of education: Not on file   Highest education level: Not on file  Occupational History   Occupation: retired  Tobacco Use   Smoking status: Every Day    Current packs/day: 0.25    Average packs/day: 0.3 packs/day for 40.0 years (10.0 ttl pk-yrs)    Types: Cigarettes   Smokeless tobacco: Never   Tobacco comments:    0.5PPD 05/31/2023  Vaping Use   Vaping status: Never Used  Substance and Sexual Activity   Alcohol  use: Yes    Alcohol /week: 7.0 standard drinks of alcohol     Types: 7 Glasses of wine per week    Comment: one glass of wine daily after dinner   Drug use: Never   Sexual activity: Not Currently  Other Topics Concern   Not on file  Social History Narrative   Not on file   Social Drivers of Health   Financial Resource Strain: Low Risk  (06/21/2024)   Overall Financial Resource Strain (CARDIA)    Difficulty of Paying Living Expenses: Not very hard  Food Insecurity: No Food Insecurity (07/16/2024)   Hunger Vital Sign    Worried About Running Out of Food in the Last Year: Never true    Ran Out of Food in the Last Year: Never true  Transportation Needs: No Transportation  Needs (07/16/2024)   PRAPARE - Administrator, Civil Service (Medical): No    Lack of Transportation (Non-Medical): No  Physical Activity: Not on file  Stress: Not on file  Social Connections: Moderately Isolated (07/16/2024)   Social Connection and Isolation Panel    Frequency of Communication with Friends and Family: More than three times a week    Frequency of Social Gatherings with Friends and Family: More than three times a week    Attends Religious Services: More than 4 times per year    Active Member of [employer] or Organizations: No    Attends Banker Meetings: Never    Marital Status: Widowed  Intimate Partner Violence: Not At Risk (07/16/2024)   Humiliation, Afraid, Rape, and Kick questionnaire    Fear of Current or Ex-Partner: No    Emotionally Abused: No    Physically Abused: No    Sexually Abused: No      Family History  Problem Relation Age of Onset   Hypertension Mother    Heart disease Mother        CABG   Heart disease Father    Heart attack Father    There were no vitals filed for this visit.  Wt Readings from Last 3 Encounters:  07/15/24 150 lb (68 kg)  07/12/24 148 lb 6.4 oz (67.3 kg)  07/09/24 149 lb 8 oz (67.8 kg)   Lab Results  Component Value Date   CREATININE 0.70 07/16/2024   CREATININE 0.77 07/15/2024   CREATININE 0.96 06/28/2024    PHYSICAL EXAM:  General: Frail appearing in wheelchair with oxygen  on.  Cor: No JVD. Regular rhythm, rate.  Lungs: rhonchi scattered throughout Abdomen: soft, nontender, nondistended. Extremities: 1+ pitting edema bilateral lower legs. Varicosities bilateral lower legs/ feet Neuro:. Affect pleasant   ECG: NSR  with PVC's, HR 75 (personally reviewed)  ReDs reading: 37 %, abnormal (see plan below)    ASSESSMENT & PLAN:  1: ICM with preserved ejection fraction- - recent NSTEMI 07/25 with DES X 2 - NYHA Sandoval II - minimally fluid up with elevated ReDs reading & symptoms - weighing  daily; parameters to call about discussed - ReDs today is 37% - Echo 06/12/24: EF 60-65%, mild LVH, G2DD, normal RV, mild MR. - continue bisoprolol  5mg  BID - continue farxiga  10mg  daily - take furosemide  20mg  daily for the next 3 days and then reduce back down to PRN - continue spironolactone  25mg  daily - BMET today - resume wearing compression socks daily with removal at bedtime - BNP 06/20/24 was 303.6  2: CAD/ NSTEMI- - Underwent cardiac catheterization 06/18/24 with successful OTC guided CSI orbital atherectomy with DES x2.  - to start cardiac rehab - saw cardiology [place]) 06/24; returns 08/25 - continue clopidogrel  75mg  daily  3: HTN- - BP 118/64 - saw PCP @ [facility] 03/25. Returns 07/08/24 - BMP 06/21/24 reviewed: sodium 138, potassium 3.1, creatinine 0.69 & GFR >60  4: HLD-  - LDL 06/14/24 was 49 - lipo (a) 06/14/24 was <8.4 - continue atorvastatin  40mg  daily  5: PAF-  - EKG today is NSR, HR 75 - continue apixaban  5mg  BID - continue diltiazem  180mg  daily and 30mg  PRN   Return in 1 month, sooner if needed.   Stacey DELENA Class, FNP 07/25/24

## 2024-07-26 ENCOUNTER — Encounter: Admitting: Family

## 2024-07-29 ENCOUNTER — Ambulatory Visit

## 2024-07-31 ENCOUNTER — Ambulatory Visit

## 2024-08-05 ENCOUNTER — Ambulatory Visit

## 2024-08-07 ENCOUNTER — Ambulatory Visit

## 2024-08-07 DIAGNOSIS — I214 Non-ST elevation (NSTEMI) myocardial infarction: Secondary | ICD-10-CM

## 2024-08-07 DIAGNOSIS — Z955 Presence of coronary angioplasty implant and graft: Secondary | ICD-10-CM

## 2024-08-07 NOTE — Progress Notes (Signed)
 Cardiac Individual Treatment Plan  Patient Details  Name: Stacey Sandoval MRN: [id number] Date of Birth: [birth year] Referring Provider:   Flowsheet Row Cardiac Rehab from 07/09/2024 in [facility] Cardiac and Pulmonary Rehab  Referring Provider Dr. Evalene Lunger    Initial Encounter Date:  Flowsheet Row Cardiac Rehab from 07/09/2024 in [facility] Cardiac and Pulmonary Rehab  Date 07/09/24    Visit Diagnosis: NSTEMI (non-ST elevated myocardial infarction) [facility])  Status post coronary artery stent placement  Patient's Home Medications on Admission:  Current Outpatient Medications:    acetaminophen  (TYLENOL ) 650 MG CR tablet, Take 1,300 mg by mouth 2 (two) times daily., Disp: , Rfl:    albuterol  (PROVENTIL ) (2.5 MG/3ML) 0.083% nebulizer solution, Take 3 mLs (2.5 mg total) by nebulization every 6 (six) hours as needed for wheezing or shortness of breath., Disp: 75 mL, Rfl: 2   amoxicillin -clavulanate (AUGMENTIN ) 875-125 MG tablet, Take 1 tablet by mouth 2 (two) times daily., Disp: 10 tablet, Rfl: 0   apixaban  (ELIQUIS ) 5 MG TABS tablet, Take 1 tablet (5 mg total) by mouth 2 (two) times daily., Disp: , Rfl:    atorvastatin  (LIPITOR) 40 MG tablet, Take 1 tablet (40 mg total) by mouth daily., Disp: , Rfl:    bisoprolol  (ZEBETA ) 5 MG tablet, TAKE 1 TABLET BY MOUTH EVERY DAY, Disp: 90 tablet, Rfl: 3   Budeson-Glycopyrrol-Formoterol  (BREZTRI  AEROSPHERE) 160-9-4.8 MCG/ACT AERO, Inhale 2 puffs into the lungs in the morning and at bedtime., Disp: 5.9 g, Rfl: 0   calcitRIOL (ROCALTROL) 0.25 MCG capsule, Take 0.25 mcg by mouth 2 (two) times daily., Disp: , Rfl:    clopidogrel  (PLAVIX ) 75 MG tablet, Take 1 tablet (75 mg total) by mouth daily., Disp: 90 tablet, Rfl: 2   dapagliflozin  propanediol (FARXIGA ) 10 MG TABS tablet, Take 1 tablet (10 mg total) by mouth daily., Disp: 90 tablet, Rfl: 1   diltiazem  (CARDIZEM  CD) 180 MG 24 hr capsule, TAKE 1 CAPSULE(180 MG) BY MOUTH DAILY, Disp: 90 capsule, Rfl: 2   diltiazem   (CARDIZEM ) 30 MG tablet, Take 1 tablet (30 mg total) by mouth as needed (atrial fibrillation)., Disp: 30 tablet, Rfl: 3   Ferrous Sulfate  (IRON ) 325 (65 Fe) MG TABS, Take 1 tablet (325 mg total) by mouth in the morning and at bedtime., Disp: 60 tablet, Rfl: 0   furosemide  (LASIX ) 20 MG tablet, Take 1 tablet (20 mg total) by mouth daily as needed., Disp: 30 tablet, Rfl: 2   gabapentin  (NEURONTIN ) 300 MG capsule, Take 300 mg every am & 600 in the pm, Disp: , Rfl:    levothyroxine  (SYNTHROID ) 100 MCG tablet, Take 100 mcg by mouth daily before breakfast., Disp: , Rfl:    mirtazapine  (REMERON ) 45 MG tablet, Take 45 mg by mouth at bedtime., Disp: , Rfl:    nitroGLYCERIN  (NITROSTAT ) 0.4 MG SL tablet, Place 1 tablet (0.4 mg total) under the tongue every 5 (five) minutes x 3 doses as needed for chest pain., Disp: 25 tablet, Rfl: 1   OXYGEN , Inhale into the lungs as needed. 2-3 Liters, Disp: , Rfl:    pantoprazole  (PROTONIX ) 40 MG tablet, Take 1 tablet (40 mg total) by mouth daily., Disp: 90 tablet, Rfl: 3   potassium chloride  SA (KLOR-CON  M) 20 MEQ tablet, Take 20 mEq by mouth 4 (four) times daily as needed (prn)., Disp: , Rfl:    spironolactone  (ALDACTONE ) 25 MG tablet, Take 1 tablet (25 mg total) by mouth daily., Disp: 90 tablet, Rfl: 3   triamcinolone ointment (KENALOG) 0.5 %,  Apply 1 Application topically 2 (two) times daily., Disp: , Rfl:   Past Medical History: Past Medical History:  Diagnosis Date   Atrial fibrillation (HCC)    CHF (congestive heart failure) (HCC)    Complication of anesthesia    VERY slow to wake after hysterectomy   COPD (chronic obstructive pulmonary disease) (HCC)    Enlarged thyroid     has been referred to [facility]   Hyperlipidemia    Hypertension    Smokers' cough (HCC)    Tobacco use    Wears dentures    Full upper and lower    Tobacco Use: Social History   Tobacco Use  Smoking Status Every Day   Current packs/day: 0.25   Average packs/day: 0.3 packs/day for 40.0  years (10.0 ttl pk-yrs)   Types: Cigarettes  Smokeless Tobacco Never  Tobacco Comments   0.5PPD 05/31/2023    Labs: Review Flowsheet  More data may exist      Latest Ref Rng & Units 03/08/2022 06/14/2024 06/15/2024 06/18/2024 07/15/2024  Labs for ITP Cardiac and Pulmonary Rehab  Cholestrol 0 - 200 mg/dL - 889  - - -  LDL (calc) 0 - 99 mg/dL - 49  - - -  HDL-C >59 mg/dL - 49  - - -  Trlycerides <150 mg/dL - 60  - - -  PH, Arterial 7.35 - 7.45 7.31  - - 7.298  7.291  -  PCO2 arterial 32 - 48 mmHg 65  - - 72.7  73.7  -  Bicarbonate 20.0 - 28.0 mmol/L 32.7  - 38.8  34.1  35.6  35.5  28.2  34.7  31.5   TCO2 22 - 32 mmol/L - - - 38  38  -  O2 Saturation % 100  - 73.6  93.4  78  62  42.1  25.2  34.6     Details       Multiple values from one day are sorted in reverse-chronological order          Exercise Target Goals: Exercise Program Goal: Individual exercise prescription set using results from initial 6 min walk test and THRR while considering  patient's activity barriers and safety.   Exercise Prescription Goal: Initial exercise prescription builds to 30-45 minutes a day of aerobic activity, 2-3 days per week.  Home exercise guidelines will be given to patient during program as part of exercise prescription that the participant will acknowledge.   Education: Aerobic Exercise: - Group verbal and visual presentation on the components of exercise prescription. Introduces F.I.T.T principle from ACSM for exercise prescriptions.  Reviews F.I.T.T. principles of aerobic exercise including progression. Written material provided at class time.   Education: Resistance Exercise: - Group verbal and visual presentation on the components of exercise prescription. Introduces F.I.T.T principle from ACSM for exercise prescriptions  Reviews F.I.T.T. principles of resistance exercise including progression. Written material provided at class time.    Education: Exercise & Equipment Safety: -  Individual verbal instruction and demonstration of equipment use and safety with use of the equipment. Flowsheet Row Cardiac Rehab from 07/09/2024 in [facility] Cardiac and Pulmonary Rehab  Date 07/09/24  Educator [facility]  Instruction Review Code 1- Verbalizes Understanding    Education: Exercise Physiology & General Exercise Guidelines: - Group verbal and written instruction with models to review the exercise physiology of the cardiovascular system and associated critical values. Provides general exercise guidelines with specific guidelines to those with heart or lung disease. Written material provided at class time.  Education: Flexibility, Balance, Mind/Body Relaxation: - Group verbal and visual presentation with interactive activity on the components of exercise prescription. Introduces F.I.T.T principle from ACSM for exercise prescriptions. Reviews F.I.T.T. principles of flexibility and balance exercise training including progression. Also discusses the mind body connection.  Reviews various relaxation techniques to help reduce and manage stress (i.e. Deep breathing, progressive muscle relaxation, and visualization). Balance handout provided to take home. Written material provided at class time.   Activity Barriers & Risk Stratification:  Activity Barriers & Cardiac Risk Stratification - 07/09/24 1505       Activity Barriers & Cardiac Risk Stratification   Activity Barriers Arthritis;Back Problems;Deconditioning;Shortness of Breath;Assistive Device    Cardiac Risk Stratification Moderate          6 Minute Walk:  6 Minute Walk     Row Name 07/09/24 1503         6 Minute Walk   Phase Initial     Distance 570 feet     Walk Time 6 minutes     # of Rest Breaks 0     MPH 1.08     METS 2     RPE 13     Perceived Dyspnea  2     VO2 Peak 3.6     Symptoms Yes (comment)     Comments Right hip pain, hamstring discomfort     Resting HR 73 bpm     Resting BP 110/58     Resting Oxygen   Saturation  96 %     Exercise Oxygen  Saturation  during 6 min walk 98 %     Max Ex. HR 94 bpm     Max Ex. BP 126/60     2 Minute Post BP 118/58        Oxygen  Initial Assessment:   Oxygen  Re-Evaluation:   Oxygen  Discharge (Final Oxygen  Re-Evaluation):   Initial Exercise Prescription:  Initial Exercise Prescription - 07/09/24 1500       Date of Initial Exercise RX and Referring Provider   Date 07/09/24    Referring Provider Dr. Timothy Gollan      Oxygen    Oxygen  Continuous    Liters 2L    Maintain Oxygen  Saturation 88% or higher      Recumbant Bike   Level 1    RPM 50    Watts 25    Minutes 15    METs 2      NuStep   Level 1    SPM 80    Minutes 15    METs 2      Arm Ergometer   Level 1    Watts 25    RPM 25    Minutes 15    METs 2      Track   Laps 15    Minutes 15    METs 1.82      Prescription Details   Duration Progress to 30 minutes of continuous aerobic without signs/symptoms of physical distress      Intensity   THRR 40-80% of Max Heartrate 101-130    Ratings of Perceived Exertion 11-13    Perceived Dyspnea 0-4      Progression   Progression Continue to progress workloads to maintain intensity without signs/symptoms of physical distress.      Resistance Training   Training Prescription Yes    Weight 3lb    Reps 10-15          Perform Capillary Blood Glucose checks as needed.  Exercise Prescription Changes:   Exercise Prescription Changes     Row Name 07/09/24 1500             Response to Exercise   Blood Pressure (Admit) 110/58       Blood Pressure (Exercise) 126/60       Blood Pressure (Exit) 118/58       Heart Rate (Admit) 73 bpm       Heart Rate (Exercise) 94 bpm       Heart Rate (Exit) 75 bpm       Oxygen  Saturation (Admit) 96 %       Oxygen  Saturation (Exercise) 98 %       Oxygen  Saturation (Exit) 98 %       Rating of Perceived Exertion (Exercise) 13       Perceived Dyspnea (Exercise) 2       Symptoms  bilateral hip pain, hamstring discomfort       Comments results          Exercise Comments:   Exercise Goals and Review:   Exercise Goals     Row Name 07/09/24 1509             Exercise Goals   Increase Physical Activity Yes       Intervention Provide advice, education, support and counseling about physical activity/exercise needs.;Develop an individualized exercise prescription for aerobic and resistive training based on initial evaluation findings, risk stratification, comorbidities and participant's personal goals.       Expected Outcomes Short Term: Attend rehab on a regular basis to increase amount of physical activity.;Long Term: Add in home exercise to make exercise part of routine and to increase amount of physical activity.;Long Term: Exercising regularly at least 3-5 days a week.       Increase Strength and Stamina Yes       Intervention Provide advice, education, support and counseling about physical activity/exercise needs.;Develop an individualized exercise prescription for aerobic and resistive training based on initial evaluation findings, risk stratification, comorbidities and participant's personal goals.       Expected Outcomes Short Term: Increase workloads from initial exercise prescription for resistance, speed, and METs.;Short Term: Perform resistance training exercises routinely during rehab and add in resistance training at home;Long Term: Improve cardiorespiratory fitness, muscular endurance and strength as measured by increased METs and functional capacity ( )       Able to understand and use rate of perceived exertion (RPE) scale Yes       Intervention Provide education and explanation on how to use RPE scale       Expected Outcomes Long Term:  Able to use RPE to guide intensity level when exercising independently;Short Term: Able to use RPE daily in rehab to express subjective intensity level       Able to understand and use Dyspnea scale Yes        Intervention Provide education and explanation on how to use Dyspnea scale       Expected Outcomes Long Term: Able to use Dyspnea scale to guide intensity level when exercising independently;Short Term: Able to use Dyspnea scale daily in rehab to express subjective sense of shortness of breath during exertion       Knowledge and understanding of Target Heart Rate Range (THRR) Yes       Intervention Provide education and explanation of THRR including how the numbers were predicted and where they are located for reference       Expected Outcomes Short Term:  Able to state/look up THRR;Short Term: Able to use daily as guideline for intensity in rehab;Long Term: Able to use THRR to govern intensity when exercising independently       Able to check pulse independently Yes       Intervention Provide education and demonstration on how to check pulse in carotid and radial arteries.;Review the importance of being able to check your own pulse for safety during independent exercise       Expected Outcomes Short Term: Able to explain why pulse checking is important during independent exercise;Long Term: Able to check pulse independently and accurately       Understanding of Exercise Prescription Yes       Intervention Provide education, explanation, and written materials on patient's individual exercise prescription       Expected Outcomes Short Term: Able to explain program exercise prescription;Long Term: Able to explain home exercise prescription to exercise independently          Exercise Goals Re-Evaluation :   Discharge Exercise Prescription (Final Exercise Prescription Changes):  Exercise Prescription Changes - 07/09/24 1500       Response to Exercise   Blood Pressure (Admit) 110/58    Blood Pressure (Exercise) 126/60    Blood Pressure (Exit) 118/58    Heart Rate (Admit) 73 bpm    Heart Rate (Exercise) 94 bpm    Heart Rate (Exit) 75 bpm    Oxygen  Saturation (Admit) 96 %    Oxygen  Saturation  (Exercise) 98 %    Oxygen  Saturation (Exit) 98 %    Rating of Perceived Exertion (Exercise) 13    Perceived Dyspnea (Exercise) 2    Symptoms bilateral hip pain, hamstring discomfort    Comments results          Nutrition:  Target Goals: Understanding of nutrition guidelines, daily intake of sodium 1500mg , cholesterol 200mg , calories 30% from fat and 7% or less from saturated fats, daily to have 5 or more servings of fruits and vegetables.  Education: Nutrition 1 -Group instruction provided by verbal, written material, interactive activities, discussions, models, and posters to present general guidelines for heart healthy nutrition including macronutrients, label reading, and promoting whole foods over processed counterparts. Education serves as Pensions consultant of discussion of heart healthy eating for all. Written material provided at class time.    Education: Nutrition 2 -Group instruction provided by verbal, written material, interactive activities, discussions, models, and posters to present general guidelines for heart healthy nutrition including sodium, cholesterol, and saturated fat. Providing guidance of habit forming to improve blood pressure, cholesterol, and body weight. Written material provided at class time.     Biometrics:  Pre Biometrics - 07/09/24 1509       Pre Biometrics   Height 4' 11.8 (1.519 m)    Weight 149 lb 8 oz (67.8 kg)    Waist Circumference 38 inches    Hip Circumference 40.5 inches    Waist to Hip Ratio 0.94 %    BMI (Calculated) 29.39    Single Leg Stand 6.81 seconds           Nutrition Therapy Plan and Nutrition Goals:   Nutrition Assessments:  MEDIFICTS Score Key: >=70 Need to make dietary changes  40-70 Heart Healthy Diet <= 40 Therapeutic Level Cholesterol Diet   Picture Your Plate Scores: <59 Unhealthy dietary pattern with much room for improvement. 41-50 Dietary pattern unlikely to meet recommendations for good health and  room for improvement. 51-60 More healthful dietary pattern, with some  room for improvement.  >60 Healthy dietary pattern, although there may be some specific behaviors that could be improved.    Nutrition Goals Re-Evaluation:   Nutrition Goals Discharge (Final Nutrition Goals Re-Evaluation):   Psychosocial: Target Goals: Acknowledge presence or absence of significant depression and/or stress, maximize coping skills, provide positive support system. Participant is able to verbalize types and ability to use techniques and skills needed for reducing stress and depression.   Education: Stress, Anxiety, and Depression - Group verbal and visual presentation to define topics covered.  Reviews how body is impacted by stress, anxiety, and depression.  Also discusses healthy ways to reduce stress and to treat/manage anxiety and depression. Written material provided at class time.   Education: Sleep Hygiene -Provides group verbal and written instruction about how sleep can affect your health.  Define sleep hygiene, discuss sleep cycles and impact of sleep habits. Review good sleep hygiene tips.   Initial Review & Psychosocial Screening:  Initial Psych Review & Screening - 07/03/24 1428       Initial Review   Current issues with None Identified      Family Dynamics   Good Support System? Yes    Comments Kamayah states she can look to her daughter Luke for support. She denies any mental instability and takes no medications for her mood.      Barriers   Psychosocial barriers to participate in program The patient should benefit from training in stress management and relaxation.;There are no identifiable barriers or psychosocial needs.      Screening Interventions   Interventions Provide feedback about the scores to participant;To provide support and resources with identified psychosocial needs;Encouraged to exercise    Expected Outcomes Short Term goal: Utilizing psychosocial counselor, staff and  physician to assist with identification of specific Stressors or current issues interfering with healing process. Setting desired goal for each stressor or current issue identified.;Long Term Goal: Stressors or current issues are controlled or eliminated.;Short Term goal: Identification and review with participant of any Quality of Life or Depression concerns found by scoring the questionnaire.;Long Term goal: The participant improves quality of Life and PHQ9 Scores as seen by post scores and/or verbalization of changes          Quality of Life Scores:   Scores of 19 and below usually indicate a poorer quality of life in these areas.  A difference of  2-3 points is a clinically meaningful difference.  A difference of 2-3 points in the total score of the Quality of Life Index has been associated with significant improvement in overall quality of life, self-image, physical symptoms, and general health in studies assessing change in quality of life.  PHQ-9: Review Flowsheet       07/09/2024  Depression screen PHQ 2/9  Decreased Interest 0  Down, Depressed, Hopeless 1  PHQ - 2 Score 1  Altered sleeping 1  Tired, decreased energy 1  Change in appetite 0  Feeling bad or failure about yourself  0  Trouble concentrating 0  Moving slowly or fidgety/restless 2  Suicidal thoughts 0  PHQ-9 Score 5  Difficult doing work/chores Not difficult at all   Interpretation of Total Score  Total Score Depression Severity:  1-4 = Minimal depression, 5-9 = Mild depression, 10-14 = Moderate depression, 15-19 = Moderately severe depression, 20-27 = Severe depression   Psychosocial Evaluation and Intervention:  Psychosocial Evaluation - 07/03/24 1433       Psychosocial Evaluation & Interventions   Interventions Encouraged to exercise  with the program and follow exercise prescription;Relaxation education;Stress management education    Comments Charlynn states she can look to her daughter Luke for support. She  denies any mental instability and takes no medications for her mood.    Expected Outcomes Short: Start [facility] to help with mood. Long: Maintain a healthy mental state    Continue Psychosocial Services  Follow up required by staff          Psychosocial Re-Evaluation:   Psychosocial Discharge (Final Psychosocial Re-Evaluation):   Vocational Rehabilitation: Provide vocational rehab assistance to qualifying candidates.   Vocational Rehab Evaluation & Intervention:   Education: Education Goals: Education classes will be provided on a variety of topics geared toward better understanding of heart health and risk factor modification. Participant will state understanding/return demonstration of topics presented as noted by education test scores.  Learning Barriers/Preferences:  Learning Barriers/Preferences - 07/03/24 1425       Learning Barriers/Preferences   Learning Barriers None    Learning Preferences None          General Cardiac Education Topics:  AED/CPR: - Group verbal and written instruction with the use of models to demonstrate the basic use of the AED with the basic ABC's of resuscitation.   Test and Procedures: - Group verbal and visual presentation and models provide information about basic cardiac anatomy and function. Reviews the testing methods done to diagnose heart disease and the outcomes of the test results. Describes the treatment choices: Medical Management, Angioplasty, or Coronary Bypass Surgery for treating various heart conditions including Myocardial Infarction, Angina, Valve Disease, and Cardiac Arrhythmias. Written material provided at class time.   Medication Safety: - Group verbal and visual instruction to review commonly prescribed medications for heart and lung disease. Reviews the medication, class of the drug, and side effects. Includes the steps to properly store meds and maintain the prescription regimen. Written material provided at  class time.   Intimacy: - Group verbal instruction through game format to discuss how heart and lung disease can affect sexual intimacy. Written material provided at class time.   Know Your Numbers and Heart Failure: - Group verbal and visual instruction to discuss disease risk factors for cardiac and pulmonary disease and treatment options.  Reviews associated critical values for Overweight/Obesity, Hypertension, Cholesterol, and Diabetes.  Discusses basics of heart failure: signs/symptoms and treatments.  Introduces Heart Failure Zone chart for action plan for heart failure. Written material provided at class time.   Infection Prevention: - Provides verbal and written material to individual with discussion of infection control including proper hand washing and proper equipment cleaning during exercise session. Flowsheet Row Cardiac Rehab from 07/09/2024 in [facility] Cardiac and Pulmonary Rehab  Date 07/09/24  Educator [facility]  Instruction Review Code 1- Verbalizes Understanding    Falls Prevention: - Provides verbal and written material to individual with discussion of falls prevention and safety. Flowsheet Row Cardiac Rehab from 07/09/2024 in [facility] Cardiac and Pulmonary Rehab  Date 07/09/24  Educator [facility]  Instruction Review Code 1- Verbalizes Understanding    Other: -Provides group and verbal instruction on various topics (see comments)   Knowledge Questionnaire Score:   Core Components/Risk Factors/Patient Goals at Admission:  Personal Goals and Risk Factors at Admission - 07/03/24 1425       Core Components/Risk Factors/Patient Goals on Admission    Weight Management Yes;Weight Loss    Intervention Weight Management: Develop a combined nutrition and exercise program designed to reach desired caloric intake, while maintaining appropriate intake of  nutrient and fiber, sodium and fats, and appropriate energy expenditure required for the weight goal.;Weight Management: Provide education  and appropriate resources to help participant work on and attain dietary goals.;Weight Management/Obesity: Establish reasonable short term and long term weight goals.    Expected Outcomes Short Term: Continue to assess and modify interventions until short term weight is achieved;Weight Loss: Understanding of general recommendations for a balanced deficit meal plan, which promotes 1-2 lb weight loss per week and includes a negative energy balance of [phone] kcal/d;Understanding recommendations for meals to include 15-35% energy as protein, 25-35% energy from fat, 35-60% energy from carbohydrates, less than 200mg  of dietary cholesterol, 20-35 gm of total fiber daily;Understanding of distribution of calorie intake throughout the day with the consumption of 4-5 meals/snacks    Tobacco Cessation Yes    Number of packs per day .20    Intervention Assist the participant in steps to quit. Provide individualized education and counseling about committing to Tobacco Cessation, relapse prevention, and pharmacological support that can be provided by physician.;Education officer, environmental, assist with locating and accessing local/national Quit Smoking programs, and support quit date choice.    Expected Outcomes Short Term: Will quit all tobacco product use, adhering to prevention of relapse plan.;Short Term: Will demonstrate readiness to quit, by selecting a quit date.;Long Term: Complete abstinence from all tobacco products for at least 12 months from quit date.    Heart Failure Yes    Intervention Provide a combined exercise and nutrition program that is supplemented with education, support and counseling about heart failure. Directed toward relieving symptoms such as shortness of breath, decreased exercise tolerance, and extremity edema.    Expected Outcomes Improve functional capacity of life;Short term: Attendance in program 2-3 days a week with increased exercise capacity. Reported lower sodium intake. Reported  increased fruit and vegetable intake. Reports medication compliance.;Short term: Daily weights obtained and reported for increase. Utilizing diuretic protocols set by physician.;Long term: Adoption of self-care skills and reduction of barriers for early signs and symptoms recognition and intervention leading to self-care maintenance.    Hypertension Yes    Intervention Provide education on lifestyle modifcations including regular physical activity/exercise, weight management, moderate sodium restriction and increased consumption of fresh fruit, vegetables, and low fat dairy, alcohol  moderation, and smoking cessation.;Monitor prescription use compliance.    Expected Outcomes Short Term: Continued assessment and intervention until BP is < 140/71mm HG in hypertensive participants. < 130/21mm HG in hypertensive participants with diabetes, heart failure or chronic kidney disease.;Long Term: Maintenance of blood pressure at goal levels.    Lipids Yes    Intervention Provide education and support for participant on nutrition & aerobic/resistive exercise along with prescribed medications to achieve LDL 70mg , HDL >40mg .    Expected Outcomes Short Term: Participant states understanding of desired cholesterol values and is compliant with medications prescribed. Participant is following exercise prescription and nutrition guidelines.;Long Term: Cholesterol controlled with medications as prescribed, with individualized exercise RX and with personalized nutrition plan. Value goals: LDL < 70mg , HDL > 40 mg.          Education:Diabetes - Individual verbal and written instruction to review signs/symptoms of diabetes, desired ranges of glucose level fasting, after meals and with exercise. Acknowledge that pre and post exercise glucose checks will be done for 3 sessions at entry of program.   Core Components/Risk Factors/Patient Goals Review:    Core Components/Risk Factors/Patient Goals at Discharge (Final  Review):    ITP Comments:  ITP Comments  Row Name 07/03/24 1432 07/09/24 1503 07/10/24 0739 08/07/24 0757     ITP Comments Virtual Visit completed. Patient informed on EP and RD appointment and 6 Minute walk test. Patient also informed of patient health questionnaires on My Chart. Patient Verbalizes understanding. Visit diagnosis can be found in [facility] 06/13/2024. Completed and gym orientation for cardiac rehab. Initial ITP created and sent for review to Dr. Oneil Pinal, Medical Director. 30 Day review completed. Medical Director ITP review done, changes made as directed, and signed approval by Medical Director. New to program. 30 Day review completed. Medical Director ITP review done; changes made as directed and signed approval by Medical Director.       Comments: 30 day review

## 2024-08-14 ENCOUNTER — Ambulatory Visit

## 2024-08-19 ENCOUNTER — Ambulatory Visit

## 2024-08-21 ENCOUNTER — Ambulatory Visit

## 2024-08-26 ENCOUNTER — Ambulatory Visit

## 2024-08-27 ENCOUNTER — Other Ambulatory Visit: Payer: Self-pay | Admitting: Cardiovascular Disease

## 2024-08-27 MED ORDER — FUROSEMIDE 20 MG PO TABS: 20.0000 mg | ORAL_TABLET | Freq: Every day | ORAL | 3 refills | Status: AC | PRN

## 2024-08-28 ENCOUNTER — Ambulatory Visit

## 2024-08-28 ENCOUNTER — Telehealth: Payer: Self-pay

## 2024-08-28 NOTE — Telephone Encounter (Signed)
 Spoke with Carols daughter Suzen. She reports that Brieana is meeting Cardiologist next week to see if she can return to Rehab. Provided call back number.

## 2024-09-02 ENCOUNTER — Ambulatory Visit

## 2024-09-04 ENCOUNTER — Encounter: Admitting: Family

## 2024-09-04 ENCOUNTER — Ambulatory Visit

## 2024-09-04 DIAGNOSIS — I214 Non-ST elevation (NSTEMI) myocardial infarction: Secondary | ICD-10-CM

## 2024-09-04 DIAGNOSIS — Z955 Presence of coronary angioplasty implant and graft: Secondary | ICD-10-CM

## 2024-09-04 NOTE — Progress Notes (Signed)
 Cardiac Individual Treatment Plan  Patient Details  Name: Stacey Sandoval MRN: [id number] Date of Birth: 19-Mar-1951 Referring Provider:   Flowsheet Row Cardiac Rehab from 07/09/2024 in [facility] Cardiac and Pulmonary Rehab  Referring Provider Dr. Evalene Lunger    Initial Encounter Date:  Flowsheet Row Cardiac Rehab from 07/09/2024 in [facility] Cardiac and Pulmonary Rehab  Date 07/09/24    Visit Diagnosis: NSTEMI (non-ST elevated myocardial infarction) [facility])  Status post coronary artery stent placement  Patient's Home Medications on Admission:  Current Outpatient Medications:    acetaminophen  (TYLENOL ) 650 MG CR tablet, Take 1,300 mg by mouth 2 (two) times daily., Disp: , Rfl:    albuterol  (PROVENTIL ) (2.5 MG/3ML) 0.083% nebulizer solution, Take 3 mLs (2.5 mg total) by nebulization every 6 (six) hours as needed for wheezing or shortness of breath., Disp: 75 mL, Rfl: 2   amoxicillin -clavulanate (AUGMENTIN ) 875-125 MG tablet, Take 1 tablet by mouth 2 (two) times daily., Disp: 10 tablet, Rfl: 0   apixaban  (ELIQUIS ) 5 MG TABS tablet, Take 1 tablet (5 mg total) by mouth 2 (two) times daily., Disp: , Rfl:    atorvastatin  (LIPITOR) 40 MG tablet, Take 1 tablet (40 mg total) by mouth daily., Disp: , Rfl:    bisoprolol  (ZEBETA ) 5 MG tablet, TAKE 1 TABLET BY MOUTH EVERY DAY, Disp: 90 tablet, Rfl: 3   Budeson-Glycopyrrol-Formoterol  (BREZTRI  AEROSPHERE) 160-9-4.8 MCG/ACT AERO, Inhale 2 puffs into the lungs in the morning and at bedtime., Disp: 5.9 g, Rfl: 0   calcitRIOL (ROCALTROL) 0.25 MCG capsule, Take 0.25 mcg by mouth 2 (two) times daily., Disp: , Rfl:    clopidogrel  (PLAVIX ) 75 MG tablet, Take 1 tablet (75 mg total) by mouth daily., Disp: 90 tablet, Rfl: 2   dapagliflozin  propanediol (FARXIGA ) 10 MG TABS tablet, Take 1 tablet (10 mg total) by mouth daily., Disp: 90 tablet, Rfl: 1   diltiazem  (CARDIZEM  CD) 180 MG 24 hr capsule, TAKE 1 CAPSULE(180 MG) BY MOUTH DAILY, Disp: 90 capsule, Rfl: 2   diltiazem   (CARDIZEM ) 30 MG tablet, Take 1 tablet (30 mg total) by mouth as needed (atrial fibrillation)., Disp: 30 tablet, Rfl: 3   Ferrous Sulfate  (IRON ) 325 (65 Fe) MG TABS, Take 1 tablet (325 mg total) by mouth in the morning and at bedtime., Disp: 60 tablet, Rfl: 0   furosemide  (LASIX ) 20 MG tablet, Take 1 tablet (20 mg total) by mouth daily as needed., Disp: 90 tablet, Rfl: 3   gabapentin  (NEURONTIN ) 300 MG capsule, Take 300 mg every am & 600 in the pm, Disp: , Rfl:    levothyroxine  (SYNTHROID ) 100 MCG tablet, Take 100 mcg by mouth daily before breakfast., Disp: , Rfl:    mirtazapine  (REMERON ) 45 MG tablet, Take 45 mg by mouth at bedtime., Disp: , Rfl:    nitroGLYCERIN  (NITROSTAT ) 0.4 MG SL tablet, Place 1 tablet (0.4 mg total) under the tongue every 5 (five) minutes x 3 doses as needed for chest pain., Disp: 25 tablet, Rfl: 1   OXYGEN , Inhale into the lungs as needed. 2-3 Liters, Disp: , Rfl:    pantoprazole  (PROTONIX ) 40 MG tablet, Take 1 tablet (40 mg total) by mouth daily., Disp: 90 tablet, Rfl: 3   potassium chloride  SA (KLOR-CON  M) 20 MEQ tablet, Take 20 mEq by mouth 4 (four) times daily as needed (prn)., Disp: , Rfl:    spironolactone  (ALDACTONE ) 25 MG tablet, Take 1 tablet (25 mg total) by mouth daily., Disp: 90 tablet, Rfl: 3   triamcinolone ointment (KENALOG) 0.5 %,  Apply 1 Application topically 2 (two) times daily., Disp: , Rfl:   Past Medical History: Past Medical History:  Diagnosis Date   Atrial fibrillation (HCC)    CHF (congestive heart failure) (HCC)    Complication of anesthesia    VERY slow to wake after hysterectomy   COPD (chronic obstructive pulmonary disease) (HCC)    Enlarged thyroid     has been [facility] to [facility]   Hyperlipidemia    Hypertension    Smokers' cough (HCC)    Tobacco use    Wears dentures    Full upper and lower    Tobacco Use: Social History   Tobacco Use  Smoking Status Every Day   Current packs/day: 0.25   Average packs/day: 0.3 packs/day for 40.0  years (10.0 ttl pk-yrs)   Types: Cigarettes  Smokeless Tobacco Never  Tobacco Comments   0.5PPD 05/31/2023    Labs: Review Flowsheet  More data may exist      Latest Ref Rng & Units 03/08/2022 06/14/2024 06/15/2024 06/18/2024 07/15/2024  Labs for ITP Cardiac and Pulmonary Rehab  Cholestrol 0 - 200 mg/dL - 889  - - -  LDL (calc) 0 - 99 mg/dL - 49  - - -  HDL-C >59 mg/dL - 49  - - -  Trlycerides <150 mg/dL - 60  - - -  PH, Arterial 7.35 - 7.45 7.31  - - 7.298  7.291  -  PCO2 arterial 32 - 48 mmHg 65  - - 72.7  73.7  -  Bicarbonate 20.0 - 28.0 mmol/L 32.7  - 38.8  34.1  35.6  35.5  28.2  34.7  31.5   TCO2 22 - 32 mmol/L - - - 38  38  -  O2 Saturation % 100  - 73.6  93.4  78  62  42.1  25.2  34.6     Details       Multiple values from one day are sorted in reverse-chronological order          Exercise Target Goals: Exercise Program Goal: Individual exercise prescription set using results from initial 6 min walk test and THRR while considering  patient's activity barriers and safety.   Exercise Prescription Goal: Initial exercise prescription builds to 30-45 minutes a day of aerobic activity, 2-3 days per week.  Home exercise guidelines will be given to patient during program as part of exercise prescription that the participant will acknowledge.   Education: Aerobic Exercise: - Group verbal and visual presentation on the components of exercise prescription. Introduces F.I.T.T principle from ACSM for exercise prescriptions.  Reviews F.I.T.T. principles of aerobic exercise including progression. Written material provided at class time.   Education: Resistance Exercise: - Group verbal and visual presentation on the components of exercise prescription. Introduces F.I.T.T principle from ACSM for exercise prescriptions  Reviews F.I.T.T. principles of resistance exercise including progression. Written material provided at class time.    Education: Exercise & Equipment Safety: -  Individual verbal instruction and demonstration of equipment use and safety with use of the equipment. Flowsheet Row Cardiac Rehab from 07/09/2024 in [facility] Cardiac and Pulmonary Rehab  Date 07/09/24  Educator [facility]  Instruction Review Code 1- Verbalizes Understanding    Education: Exercise Physiology & General Exercise Guidelines: - Group verbal and written instruction with models to review the exercise physiology of the cardiovascular system and associated critical values. Provides general exercise guidelines with specific guidelines to those with heart or lung disease. Written material provided at class time.  Education: Flexibility, Balance, Mind/Body Relaxation: - Group verbal and visual presentation with interactive activity on the components of exercise prescription. Introduces F.I.T.T principle from ACSM for exercise prescriptions. Reviews F.I.T.T. principles of flexibility and balance exercise training including progression. Also discusses the mind body connection.  Reviews various relaxation techniques to help reduce and manage stress (i.e. Deep breathing, progressive muscle relaxation, and visualization). Balance handout provided to take home. Written material provided at class time.   Activity Barriers & Risk Stratification:  Activity Barriers & Cardiac Risk Stratification - 07/09/24 1505       Activity Barriers & Cardiac Risk Stratification   Activity Barriers Arthritis;Back Problems;Deconditioning;Shortness of Breath;Assistive Device    Cardiac Risk Stratification Moderate          6 Minute Walk:  6 Minute Walk     Row Name 07/09/24 1503         6 Minute Walk   Phase Initial     Distance 570 feet     Walk Time 6 minutes     # of Rest Breaks 0     MPH 1.08     METS 2     RPE 13     Perceived Dyspnea  2     VO2 Peak 3.6     Symptoms Yes (comment)     Comments Right hip pain, hamstring discomfort     Resting HR 73 bpm     Resting BP 110/58     Resting Oxygen   Saturation  96 %     Exercise Oxygen  Saturation  during 6 min walk 98 %     Max Ex. HR 94 bpm     Max Ex. BP 126/60     2 Minute Post BP 118/58        Oxygen  Initial Assessment:   Oxygen  Re-Evaluation:   Oxygen  Discharge (Final Oxygen  Re-Evaluation):   Initial Exercise Prescription:  Initial Exercise Prescription - 07/09/24 1500       Date of Initial Exercise RX and Referring Provider   Date 07/09/24    Referring Provider Dr. Timothy Gollan      Oxygen    Oxygen  Continuous    Liters 2L    Maintain Oxygen  Saturation 88% or higher      Recumbant Bike   Level 1    RPM 50    Watts 25    Minutes 15    METs 2      NuStep   Level 1    SPM 80    Minutes 15    METs 2      Arm Ergometer   Level 1    Watts 25    RPM 25    Minutes 15    METs 2      Track   Laps 15    Minutes 15    METs 1.82      Prescription Details   Duration Progress to 30 minutes of continuous aerobic without signs/symptoms of physical distress      Intensity   THRR 40-80% of Max Heartrate 101-130    Ratings of Perceived Exertion 11-13    Perceived Dyspnea 0-4      Progression   Progression Continue to progress workloads to maintain intensity without signs/symptoms of physical distress.      Resistance Training   Training Prescription Yes    Weight 3lb    Reps 10-15          Perform Capillary Blood Glucose checks as needed.  Exercise Prescription Changes:   Exercise Prescription Changes     Row Name 07/09/24 1500             Response to Exercise   Blood Pressure (Admit) 110/58       Blood Pressure (Exercise) 126/60       Blood Pressure (Exit) 118/58       Heart Rate (Admit) 73 bpm       Heart Rate (Exercise) 94 bpm       Heart Rate (Exit) 75 bpm       Oxygen  Saturation (Admit) 96 %       Oxygen  Saturation (Exercise) 98 %       Oxygen  Saturation (Exit) 98 %       Rating of Perceived Exertion (Exercise) 13       Perceived Dyspnea (Exercise) 2       Symptoms  bilateral hip pain, hamstring discomfort       Comments results          Exercise Comments:   Exercise Goals and Review:   Exercise Goals     Row Name 07/09/24 1509             Exercise Goals   Increase Physical Activity Yes       Intervention Provide advice, education, support and counseling about physical activity/exercise needs.;Develop an individualized exercise prescription for aerobic and resistive training based on initial evaluation findings, risk stratification, comorbidities and participant's personal goals.       Expected Outcomes Short Term: Attend rehab on a regular basis to increase amount of physical activity.;Long Term: Add in home exercise to make exercise part of routine and to increase amount of physical activity.;Long Term: Exercising regularly at least 3-5 days a week.       Increase Strength and Stamina Yes       Intervention Provide advice, education, support and counseling about physical activity/exercise needs.;Develop an individualized exercise prescription for aerobic and resistive training based on initial evaluation findings, risk stratification, comorbidities and participant's personal goals.       Expected Outcomes Short Term: Increase workloads from initial exercise prescription for resistance, speed, and METs.;Short Term: Perform resistance training exercises routinely during rehab and add in resistance training at home;Long Term: Improve cardiorespiratory fitness, muscular endurance and strength as measured by increased METs and functional capacity ( )       Able to understand and use rate of perceived exertion (RPE) scale Yes       Intervention Provide education and explanation on how to use RPE scale       Expected Outcomes Long Term:  Able to use RPE to guide intensity level when exercising independently;Short Term: Able to use RPE daily in rehab to express subjective intensity level       Able to understand and use Dyspnea scale Yes        Intervention Provide education and explanation on how to use Dyspnea scale       Expected Outcomes Long Term: Able to use Dyspnea scale to guide intensity level when exercising independently;Short Term: Able to use Dyspnea scale daily in rehab to express subjective sense of shortness of breath during exertion       Knowledge and understanding of Target Heart Rate Range (THRR) Yes       Intervention Provide education and explanation of THRR including how the numbers were predicted and where they are located for reference       Expected Outcomes Short Term:  Able to state/look up THRR;Short Term: Able to use daily as guideline for intensity in rehab;Long Term: Able to use THRR to govern intensity when exercising independently       Able to check pulse independently Yes       Intervention Provide education and demonstration on how to check pulse in carotid and radial arteries.;Review the importance of being able to check your own pulse for safety during independent exercise       Expected Outcomes Short Term: Able to explain why pulse checking is important during independent exercise;Long Term: Able to check pulse independently and accurately       Understanding of Exercise Prescription Yes       Intervention Provide education, explanation, and written materials on patient's individual exercise prescription       Expected Outcomes Short Term: Able to explain program exercise prescription;Long Term: Able to explain home exercise prescription to exercise independently          Exercise Goals Re-Evaluation :   Discharge Exercise Prescription (Final Exercise Prescription Changes):  Exercise Prescription Changes - 07/09/24 1500       Response to Exercise   Blood Pressure (Admit) 110/58    Blood Pressure (Exercise) 126/60    Blood Pressure (Exit) 118/58    Heart Rate (Admit) 73 bpm    Heart Rate (Exercise) 94 bpm    Heart Rate (Exit) 75 bpm    Oxygen  Saturation (Admit) 96 %    Oxygen  Saturation  (Exercise) 98 %    Oxygen  Saturation (Exit) 98 %    Rating of Perceived Exertion (Exercise) 13    Perceived Dyspnea (Exercise) 2    Symptoms bilateral hip pain, hamstring discomfort    Comments results          Nutrition:  Target Goals: Understanding of nutrition guidelines, daily intake of sodium 1500mg , cholesterol 200mg , calories 30% from fat and 7% or less from saturated fats, daily to have 5 or more servings of fruits and vegetables.  Education: Nutrition 1 -Group instruction provided by verbal, written material, interactive activities, discussions, models, and posters to present general guidelines for heart healthy nutrition including macronutrients, label reading, and promoting whole foods over processed counterparts. Education serves as Pensions consultant of discussion of heart healthy eating for all. Written material provided at class time.    Education: Nutrition 2 -Group instruction provided by verbal, written material, interactive activities, discussions, models, and posters to present general guidelines for heart healthy nutrition including sodium, cholesterol, and saturated fat. Providing guidance of habit forming to improve blood pressure, cholesterol, and body weight. Written material provided at class time.     Biometrics:  Pre Biometrics - 07/09/24 1509       Pre Biometrics   Height 4' 11.8 (1.519 m)    Weight 149 lb 8 oz (67.8 kg)    Waist Circumference 38 inches    Hip Circumference 40.5 inches    Waist to Hip Ratio 0.94 %    BMI (Calculated) 29.39    Single Leg Stand 6.81 seconds           Nutrition Therapy Plan and Nutrition Goals:   Nutrition Assessments:  MEDIFICTS Score Key: >=70 Need to make dietary changes  40-70 Heart Healthy Diet <= 40 Therapeutic Level Cholesterol Diet   Picture Your Plate Scores: <59 Unhealthy dietary pattern with much room for improvement. 41-50 Dietary pattern unlikely to meet recommendations for good health and  room for improvement. 51-60 More healthful dietary pattern, with some  room for improvement.  >60 Healthy dietary pattern, although there may be some specific behaviors that could be improved.    Nutrition Goals Re-Evaluation:   Nutrition Goals Discharge (Final Nutrition Goals Re-Evaluation):   Psychosocial: Target Goals: Acknowledge presence or absence of significant depression and/or stress, maximize coping skills, provide positive support system. Participant is able to verbalize types and ability to use techniques and skills needed for reducing stress and depression.   Education: Stress, Anxiety, and Depression - Group verbal and visual presentation to define topics covered.  Reviews how body is impacted by stress, anxiety, and depression.  Also discusses healthy ways to reduce stress and to treat/manage anxiety and depression. Written material provided at class time.   Education: Sleep Hygiene -Provides group verbal and written instruction about how sleep can affect your health.  Define sleep hygiene, discuss sleep cycles and impact of sleep habits. Review good sleep hygiene tips.   Initial Review & Psychosocial Screening:  Initial Psych Review & Screening - 07/03/24 1428       Initial Review   Current issues with None Identified      Family Dynamics   Good Support System? Yes    Comments Iline states she can look to her daughter Luke for support. She denies any mental instability and takes no medications for her mood.      Barriers   Psychosocial barriers to participate in program The patient should benefit from training in stress management and relaxation.;There are no identifiable barriers or psychosocial needs.      Screening Interventions   Interventions Provide feedback about the scores to participant;To provide support and resources with identified psychosocial needs;Encouraged to exercise    Expected Outcomes Short Term goal: Utilizing psychosocial counselor, staff and  physician to assist with identification of specific Stressors or current issues interfering with healing process. Setting desired goal for each stressor or current issue identified.;Long Term Goal: Stressors or current issues are controlled or eliminated.;Short Term goal: Identification and review with participant of any Quality of Life or Depression concerns found by scoring the questionnaire.;Long Term goal: The participant improves quality of Life and PHQ9 Scores as seen by post scores and/or verbalization of changes          Quality of Life Scores:   Scores of 19 and below usually indicate a poorer quality of life in these areas.  A difference of  2-3 points is a clinically meaningful difference.  A difference of 2-3 points in the total score of the Quality of Life Index has been associated with significant improvement in overall quality of life, self-image, physical symptoms, and general health in studies assessing change in quality of life.  PHQ-9: Review Flowsheet       07/09/2024  Depression screen PHQ 2/9  Decreased Interest 0  Down, Depressed, Hopeless 1  PHQ - 2 Score 1  Altered sleeping 1  Tired, decreased energy 1  Change in appetite 0  Feeling bad or failure about yourself  0  Trouble concentrating 0  Moving slowly or fidgety/restless 2  Suicidal thoughts 0  PHQ-9 Score 5  Difficult doing work/chores Not difficult at all   Interpretation of Total Score  Total Score Depression Severity:  1-4 = Minimal depression, 5-9 = Mild depression, 10-14 = Moderate depression, 15-19 = Moderately severe depression, 20-27 = Severe depression   Psychosocial Evaluation and Intervention:  Psychosocial Evaluation - 07/03/24 1433       Psychosocial Evaluation & Interventions   Interventions Encouraged to exercise  with the program and follow exercise prescription;Relaxation education;Stress management education    Comments Brynley states she can look to her daughter Luke for support. She  denies any mental instability and takes no medications for her mood.    Expected Outcomes Short: Start [facility] to help with mood. Long: Maintain a healthy mental state    Continue Psychosocial Services  Follow up required by staff          Psychosocial Re-Evaluation:   Psychosocial Discharge (Final Psychosocial Re-Evaluation):   Vocational Rehabilitation: Provide vocational rehab assistance to qualifying candidates.   Vocational Rehab Evaluation & Intervention:   Education: Education Goals: Education classes will be provided on a variety of topics geared toward better understanding of heart health and risk factor modification. Participant will state understanding/return demonstration of topics presented as noted by education test scores.  Learning Barriers/Preferences:  Learning Barriers/Preferences - 07/03/24 1425       Learning Barriers/Preferences   Learning Barriers None    Learning Preferences None          General Cardiac Education Topics:  AED/CPR: - Group verbal and written instruction with the use of models to demonstrate the basic use of the AED with the basic ABC's of resuscitation.   Test and Procedures: - Group verbal and visual presentation and models provide information about basic cardiac anatomy and function. Reviews the testing methods done to diagnose heart disease and the outcomes of the test results. Describes the treatment choices: Medical Management, Angioplasty, or Coronary Bypass Surgery for treating various heart conditions including Myocardial Infarction, Angina, Valve Disease, and Cardiac Arrhythmias. Written material provided at class time.   Medication Safety: - Group verbal and visual instruction to review commonly prescribed medications for heart and lung disease. Reviews the medication, class of the drug, and side effects. Includes the steps to properly store meds and maintain the prescription regimen. Written material provided at  class time.   Intimacy: - Group verbal instruction through game format to discuss how heart and lung disease can affect sexual intimacy. Written material provided at class time.   Know Your Numbers and Heart Failure: - Group verbal and visual instruction to discuss disease risk factors for cardiac and pulmonary disease and treatment options.  Reviews associated critical values for Overweight/Obesity, Hypertension, Cholesterol, and Diabetes.  Discusses basics of heart failure: signs/symptoms and treatments.  Introduces Heart Failure Zone chart for action plan for heart failure. Written material provided at class time.   Infection Prevention: - Provides verbal and written material to individual with discussion of infection control including proper hand washing and proper equipment cleaning during exercise session. Flowsheet Row Cardiac Rehab from 07/09/2024 in [facility] Cardiac and Pulmonary Rehab  Date 07/09/24  Educator [facility]  Instruction Review Code 1- Verbalizes Understanding    Falls Prevention: - Provides verbal and written material to individual with discussion of falls prevention and safety. Flowsheet Row Cardiac Rehab from 07/09/2024 in [facility] Cardiac and Pulmonary Rehab  Date 07/09/24  Educator [facility]  Instruction Review Code 1- Verbalizes Understanding    Other: -Provides group and verbal instruction on various topics (see comments)   Knowledge Questionnaire Score:   Core Components/Risk Factors/Patient Goals at Admission:  Personal Goals and Risk Factors at Admission - 07/03/24 1425       Core Components/Risk Factors/Patient Goals on Admission    Weight Management Yes;Weight Loss    Intervention Weight Management: Develop a combined nutrition and exercise program designed to reach desired caloric intake, while maintaining appropriate intake of  nutrient and fiber, sodium and fats, and appropriate energy expenditure required for the weight goal.;Weight Management: Provide education  and appropriate resources to help participant work on and attain dietary goals.;Weight Management/Obesity: Establish reasonable short term and long term weight goals.    Expected Outcomes Short Term: Continue to assess and modify interventions until short term weight is achieved;Weight Loss: Understanding of general recommendations for a balanced deficit meal plan, which promotes 1-2 lb weight loss per week and includes a negative energy balance of [phone] kcal/d;Understanding recommendations for meals to include 15-35% energy as protein, 25-35% energy from fat, 35-60% energy from carbohydrates, less than 200mg  of dietary cholesterol, 20-35 gm of total fiber daily;Understanding of distribution of calorie intake throughout the day with the consumption of 4-5 meals/snacks    Tobacco Cessation Yes    Number of packs per day .20    Intervention Assist the participant in steps to quit. Provide individualized education and counseling about committing to Tobacco Cessation, relapse prevention, and pharmacological support that can be provided by physician.;Education officer, environmental, assist with locating and accessing local/national Quit Smoking programs, and support quit date choice.    Expected Outcomes Short Term: Will quit all tobacco product use, adhering to prevention of relapse plan.;Short Term: Will demonstrate readiness to quit, by selecting a quit date.;Long Term: Complete abstinence from all tobacco products for at least 12 months from quit date.    Heart Failure Yes    Intervention Provide a combined exercise and nutrition program that is supplemented with education, support and counseling about heart failure. Directed toward relieving symptoms such as shortness of breath, decreased exercise tolerance, and extremity edema.    Expected Outcomes Improve functional capacity of life;Short term: Attendance in program 2-3 days a week with increased exercise capacity. Reported lower sodium intake. Reported  increased fruit and vegetable intake. Reports medication compliance.;Short term: Daily weights obtained and reported for increase. Utilizing diuretic protocols set by physician.;Long term: Adoption of self-care skills and reduction of barriers for early signs and symptoms recognition and intervention leading to self-care maintenance.    Hypertension Yes    Intervention Provide education on lifestyle modifcations including regular physical activity/exercise, weight management, moderate sodium restriction and increased consumption of fresh fruit, vegetables, and low fat dairy, alcohol  moderation, and smoking cessation.;Monitor prescription use compliance.    Expected Outcomes Short Term: Continued assessment and intervention until BP is < 140/38mm HG in hypertensive participants. < 130/64mm HG in hypertensive participants with diabetes, heart failure or chronic kidney disease.;Long Term: Maintenance of blood pressure at goal levels.    Lipids Yes    Intervention Provide education and support for participant on nutrition & aerobic/resistive exercise along with prescribed medications to achieve LDL 70mg , HDL >40mg .    Expected Outcomes Short Term: Participant states understanding of desired cholesterol values and is compliant with medications prescribed. Participant is following exercise prescription and nutrition guidelines.;Long Term: Cholesterol controlled with medications as prescribed, with individualized exercise RX and with personalized nutrition plan. Value goals: LDL < 70mg , HDL > 40 mg.          Education:Diabetes - Individual verbal and written instruction to review signs/symptoms of diabetes, desired ranges of glucose level fasting, after meals and with exercise. Acknowledge that pre and post exercise glucose checks will be done for 3 sessions at entry of program.   Core Components/Risk Factors/Patient Goals Review:    Core Components/Risk Factors/Patient Goals at Discharge (Final  Review):    ITP Comments:  ITP Comments  Row Name 07/03/24 1432 07/09/24 1503 07/10/24 0739 08/07/24 0757 09/04/24 0954   ITP Comments Virtual Visit completed. Patient informed on EP and RD appointment and 6 Minute walk test. Patient also informed of patient health questionnaires on My Chart. Patient Verbalizes understanding. Visit diagnosis can be found in [facility] 06/13/2024. Completed and gym orientation for cardiac rehab. Initial ITP created and sent for review to Dr. Oneil Pinal, Medical Director. 30 Day review completed. Medical Director ITP review done, changes made as directed, and signed approval by Medical Director. New to program. 30 Day review completed. Medical Director ITP review done; changes made as directed and signed approval by Medical Director. 30 Day review completed. Medical Director ITP review done; changes made as directed and signed approval by Medical Director.      Comments: 30 day review

## 2024-09-05 ENCOUNTER — Telehealth: Payer: Self-pay | Admitting: Family

## 2024-09-05 NOTE — Progress Notes (Unsigned)
 Advanced Heart Failure Clinic Note   Referring Physician: admission PCP: [facility] Cardiologist: Evalene Lunger, MD   Chief Complaint: shortness of breath   HPI:  Stacey Sandoval is a 73 y/o female with a history of paroxysmal atrial fibrillation, COPD, HFpEF, current smoker x 40+ years, NSTEMI (07/25), enlarged thyroid , hyperlipidemia, AVM (arteriovenous malformation) of small bowel, anemia, COPD and HTN.   Admitted 06/12/24 with acute chest pain that woke her from sleep. Radiation to left side of neck and arm. Troponin up trended from 25 -->479->707 and the ER initiated a heparin  drip. CTA of the chest was negative for PE. Found to have NSTEMI. Echo 06/12/24: EF 60-65%, mild LVH, G2DD, normal RV, mild MR. Taken for [facility] 06/13/24 revealing widespread stenosis in the RCA. Load with antiplatelets therapy restart IV heparin  2 hours post TR band removal. Cardiology has arranged transfer to [facility] with plan for atherectomy-based PCI to the RCA.   Admitted 06/13/24 from OSH for atherectomy and PCI. Initially planned for 7/7 but was significantly dyspneic and tachycardic on exam having difficulty lying flat. Underwent cardiac catheterization 7/8 with successful OTC guided CSI orbital atherectomy with DES x2. Recommendations initially for DAPT with aspirin /Plavix  for 1 month, given the need for Eliquis . Then plan to drop aspirin  after 30 days. With GI bleed, will plan to stop ASA once Eliquis  resumed and continue plavix . Developed episode of melena with large black tarry bowel movement evening of 7/8. 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 IV diuresed.   Admitted 07/15/24 with chest pain/ SOB. Found to be in AF RVR. Patient self-converted with improvement of symptoms. IVF given due to hypotension. Placed on bipap and given nebulizer. Unable to tolerate bipap. Given antibiotics for possible pneumonia. Hypercarbic  so she tried bipap again. Discharged the next day.    She presents today, with her daughter, for a HF follow-up visit with a chief complaint of shortness of breath. Has associated fatigue. Has lasix  that she takes PRN and has taken it a couple of times since she was discharged back in August. Overall she says that she's feeling quite well. Appetite is good and weight has been stable.   ROS: All systems negative except what is listed in HPI, PMH and Problem List  Past Medical History:  Diagnosis Date   Atrial fibrillation (HCC)    CHF (congestive heart failure) (HCC)    Complication of anesthesia    VERY slow to wake after hysterectomy   COPD (chronic obstructive pulmonary disease) (HCC)    Enlarged thyroid     has been referred to [facility]   Hyperlipidemia    Hypertension    Smokers' cough (HCC)    Tobacco use    Wears dentures    Full upper and lower    Current Outpatient Medications  Medication Sig Dispense Refill   acetaminophen  (TYLENOL ) 650 MG CR tablet Take 1,300 mg by mouth 2 (two) times daily.     albuterol  (PROVENTIL ) (2.5 MG/3ML) 0.083% nebulizer solution Take 3 mLs (2.5 mg total) by nebulization every 6 (six) hours as needed for wheezing or shortness of breath. 75 mL 2   amoxicillin -clavulanate (AUGMENTIN ) 875-125 MG tablet Take 1 tablet by mouth 2 (two) times daily. 10 tablet 0   apixaban  (ELIQUIS ) 5 MG TABS tablet Take 1 tablet (5 mg total) by mouth 2 (two) times daily.     atorvastatin  (LIPITOR) 40 MG tablet Take 1 tablet (  40 mg total) by mouth daily.     bisoprolol  (ZEBETA ) 5 MG tablet TAKE 1 TABLET BY MOUTH EVERY DAY 90 tablet 3   Budeson-Glycopyrrol-Formoterol  (BREZTRI  AEROSPHERE) 160-9-4.8 MCG/ACT AERO Inhale 2 puffs into the lungs in the morning and at bedtime. 5.9 g 0   calcitRIOL (ROCALTROL) 0.25 MCG capsule Take 0.25 mcg by mouth 2 (two) times daily.     clopidogrel  (PLAVIX ) 75 MG tablet Take 1 tablet (75 mg total) by mouth daily. 90 tablet 2   dapagliflozin   propanediol (FARXIGA ) 10 MG TABS tablet Take 1 tablet (10 mg total) by mouth daily. 90 tablet 1   diltiazem  (CARDIZEM  CD) 180 MG 24 hr capsule TAKE 1 CAPSULE(180 MG) BY MOUTH DAILY 90 capsule 2   diltiazem  (CARDIZEM ) 30 MG tablet Take 1 tablet (30 mg total) by mouth as needed (atrial fibrillation). 30 tablet 3   Ferrous Sulfate  (IRON ) 325 (65 Fe) MG TABS Take 1 tablet (325 mg total) by mouth in the morning and at bedtime. 60 tablet 0   furosemide  (LASIX ) 20 MG tablet Take 1 tablet (20 mg total) by mouth daily as needed. 90 tablet 3   gabapentin  (NEURONTIN ) 300 MG capsule Take 300 mg every am & 600 in the pm     levothyroxine  (SYNTHROID ) 100 MCG tablet Take 100 mcg by mouth daily before breakfast.     mirtazapine  (REMERON ) 45 MG tablet Take 45 mg by mouth at bedtime.     nitroGLYCERIN  (NITROSTAT ) 0.4 MG SL tablet Place 1 tablet (0.4 mg total) under the tongue every 5 (five) minutes x 3 doses as needed for chest pain. 25 tablet 1   OXYGEN  Inhale into the lungs as needed. 2-3 Liters     pantoprazole  (PROTONIX ) 40 MG tablet Take 1 tablet (40 mg total) by mouth daily. 90 tablet 3   potassium chloride  SA (KLOR-CON  M) 20 MEQ tablet Take 20 mEq by mouth 4 (four) times daily as needed (prn).     spironolactone  (ALDACTONE ) 25 MG tablet Take 1 tablet (25 mg total) by mouth daily. 90 tablet 3   triamcinolone ointment (KENALOG) 0.5 % Apply 1 Application topically 2 (two) times daily.     No current facility-administered medications for this visit.    Allergies  Allergen Reactions   Dextromethorphan -Guaifenesin  Shortness Of Breath, Anxiety and Other (See Comments)    Globus pharyngeus   Codeine     Other Reaction(s): sedation   Ibuprofen     Other Reaction(s): GI upset   Trazodone Nausea Only   Zolpidem Nausea Only      Social History   Socioeconomic History   Marital status: Widowed    Spouse name: Not on file   Number of children: 6   Years of education: Not on file   Highest education  level: Not on file  Occupational History   Occupation: retired  Tobacco Use   Smoking status: Every Day    Current packs/day: 0.25    Average packs/day: 0.3 packs/day for 40.0 years (10.0 ttl pk-yrs)    Types: Cigarettes   Smokeless tobacco: Never   Tobacco comments:    0.5PPD 05/31/2023  Vaping Use   Vaping status: Never Used  Substance and Sexual Activity   Alcohol  use: Yes    Alcohol /week: 7.0 standard drinks of alcohol     Types: 7 Glasses of wine per week    Comment: one glass of wine daily after dinner   Drug use: Never   Sexual activity: Not Currently  Other Topics  Concern   Not on file  Social History Narrative   Not on file   Social Drivers of Health   Financial Resource Strain: Low Risk  (06/21/2024)   Overall Financial Resource Strain (CARDIA)    Difficulty of Paying Living Expenses: Not very hard  Food Insecurity: No Food Insecurity (07/16/2024)   Hunger Vital Sign    Worried About Running Out of Food in the Last Year: Never true    Ran Out of Food in the Last Year: Never true  Transportation Needs: No Transportation Needs (07/16/2024)   PRAPARE - Administrator, Civil Service (Medical): No    Lack of Transportation (Non-Medical): No  Physical Activity: Not on file  Stress: Not on file  Social Connections: Moderately Isolated (07/16/2024)   Social Connection and Isolation Panel    Frequency of Communication with Friends and Family: More than three times a week    Frequency of Social Gatherings with Friends and Family: More than three times a week    Attends Religious Services: More than 4 times per year    Active Member of [employer] or Organizations: No    Attends Banker Meetings: Never    Marital Status: Widowed  Intimate Partner Violence: Not At Risk (07/16/2024)   Humiliation, Afraid, Rape, and Kick questionnaire    Fear of Current or Ex-Partner: No    Emotionally Abused: No    Physically Abused: No    Sexually Abused: No      Family  History  Problem Relation Age of Onset   Hypertension Mother    Heart disease Mother        CABG   Heart disease Father    Heart attack Father    Vitals:   09/06/24 1359  BP: (!) 105/54  Pulse: 80  SpO2: 92%  Weight: 147 lb (66.7 kg)   Wt Readings from Last 3 Encounters:  09/06/24 147 lb (66.7 kg)  07/15/24 150 lb (68 kg)  07/12/24 148 lb 6.4 oz (67.3 kg)   Lab Results  Component Value Date   CREATININE 0.70 07/16/2024   CREATININE 0.77 07/15/2024   CREATININE 0.96 06/28/2024     PHYSICAL EXAM:  General: Frail appearing  Cor: No JVD. Regular rhythm, rate.  Lungs: few expiratory wheezes in bilateral upper lobes Abdomen: soft, nontender, nondistended. Extremities: trace edema bilateral lower legs. Numerous varicosities in lower legs Neuro:. Affect pleasant   ECG: NSR, HR 81 (personally reviewed)   ASSESSMENT & PLAN:  1: ICM with preserved ejection fraction- - recent NSTEMI 07/25 with DES X 2 - NYHA class II - euvolemic - weighing daily & she says that her home weight has been stable - Echo 06/12/24: EF 60-65%, mild LVH, G2DD, normal RV, mild MR. - continue bisoprolol  5mg  QD - continue farxiga  10mg  daily - take furosemide  20mg  daily PRN - continue spironolactone  25mg  daily - BNP 07/15/24 was 295.5  2: CAD/ NSTEMI- - Underwent cardiac catheterization 06/18/24 with successful OTC guided CSI orbital atherectomy with DES x2.  - to start cardiac rehab - saw cardiology Dene) 08/25 - continue clopidogrel  75mg  daily  3: HTN- - BP 105/54 - [facility] @ [facility] 08/25 - BMP 07/16/24 reviewed: sodium 141, potassium 4.3, creatinine 0.7 & GFR >60  4: HLD-  - LDL 06/14/24 was 49 - lipo (a) 06/14/24 was <8.4 - continue atorvastatin  40mg  daily  5: PAF-  - EKG today is NSR, HR 81 - continue apixaban  5mg  BID -  continue diltiazem  180mg  daily and 30mg  PRN   Return in 4 months, sooner if needed. If at that time, HF remains stable, will have her return  PRN.   I spent 22 minutes reviewing records, interviewing/ examing patient and managing plan/ orders.    Ellouise DELENA Class, FNP 09/05/24

## 2024-09-05 NOTE — Telephone Encounter (Signed)
 Called to confirm/remind patient of their appointment at the Advanced [facility] Failure Clinic on 09/06/24.   Appointment:   [x] Confirmed  [] Left mess   [] No answer/No voice mail  [] VM Full/unable to leave message  [] Phone not in service  Patient reminded to bring all medications and/or complete list.  Confirmed patient has transportation. Gave directions, instructed to utilize valet parking.

## 2024-09-06 ENCOUNTER — Encounter: Payer: Self-pay | Admitting: Family

## 2024-09-06 ENCOUNTER — Ambulatory Visit: Attending: Family | Admitting: Family

## 2024-09-06 VITALS — BP 105/54 | HR 80 | Wt 147.0 lb

## 2024-09-06 DIAGNOSIS — F17210 Nicotine dependence, cigarettes, uncomplicated: Secondary | ICD-10-CM | POA: Insufficient documentation

## 2024-09-06 DIAGNOSIS — I10 Essential (primary) hypertension: Secondary | ICD-10-CM

## 2024-09-06 DIAGNOSIS — I252 Old myocardial infarction: Secondary | ICD-10-CM | POA: Insufficient documentation

## 2024-09-06 DIAGNOSIS — R5383 Other fatigue: Secondary | ICD-10-CM | POA: Insufficient documentation

## 2024-09-06 DIAGNOSIS — I110 Hypertensive heart disease with heart failure: Secondary | ICD-10-CM | POA: Insufficient documentation

## 2024-09-06 DIAGNOSIS — E782 Mixed hyperlipidemia: Secondary | ICD-10-CM | POA: Diagnosis not present

## 2024-09-06 DIAGNOSIS — I5032 Chronic diastolic (congestive) heart failure: Secondary | ICD-10-CM | POA: Diagnosis not present

## 2024-09-06 DIAGNOSIS — I2510 Atherosclerotic heart disease of native coronary artery without angina pectoris: Secondary | ICD-10-CM | POA: Insufficient documentation

## 2024-09-06 DIAGNOSIS — E785 Hyperlipidemia, unspecified: Secondary | ICD-10-CM | POA: Insufficient documentation

## 2024-09-06 DIAGNOSIS — Z7901 Long term (current) use of anticoagulants: Secondary | ICD-10-CM | POA: Diagnosis not present

## 2024-09-06 DIAGNOSIS — R0602 Shortness of breath: Secondary | ICD-10-CM | POA: Insufficient documentation

## 2024-09-06 DIAGNOSIS — Q2739 Arteriovenous malformation, other site: Secondary | ICD-10-CM | POA: Insufficient documentation

## 2024-09-06 DIAGNOSIS — Z79899 Other long term (current) drug therapy: Secondary | ICD-10-CM | POA: Diagnosis not present

## 2024-09-06 DIAGNOSIS — I480 Paroxysmal atrial fibrillation: Secondary | ICD-10-CM | POA: Insufficient documentation

## 2024-09-06 DIAGNOSIS — J449 Chronic obstructive pulmonary disease, unspecified: Secondary | ICD-10-CM | POA: Insufficient documentation

## 2024-09-06 NOTE — Patient Instructions (Signed)
 It was good to see you today!

## 2024-09-09 ENCOUNTER — Ambulatory Visit

## 2024-09-09 ENCOUNTER — Telehealth: Payer: Self-pay

## 2024-09-09 NOTE — Telephone Encounter (Signed)
 Spoke with Twilla, Danean's daughter. Quinley went to heart failure clinic and was told she is doing very well. Soyla and Suzen would like to see a cardiologist on 10/3 to be told she is clear to return to rehab. Will follow up next week on update

## 2024-09-11 ENCOUNTER — Ambulatory Visit

## 2024-09-13 ENCOUNTER — Ambulatory Visit: Attending: Medical | Admitting: Medical

## 2024-09-13 ENCOUNTER — Encounter: Payer: Self-pay | Admitting: Medical

## 2024-09-13 VITALS — BP 119/60 | HR 75 | Ht 59.0 in | Wt 148.4 lb

## 2024-09-13 DIAGNOSIS — J449 Chronic obstructive pulmonary disease, unspecified: Secondary | ICD-10-CM

## 2024-09-13 DIAGNOSIS — I2510 Atherosclerotic heart disease of native coronary artery without angina pectoris: Secondary | ICD-10-CM | POA: Diagnosis not present

## 2024-09-13 DIAGNOSIS — Z8719 Personal history of other diseases of the digestive system: Secondary | ICD-10-CM | POA: Diagnosis not present

## 2024-09-13 DIAGNOSIS — Z720 Tobacco use: Secondary | ICD-10-CM

## 2024-09-13 DIAGNOSIS — I480 Paroxysmal atrial fibrillation: Secondary | ICD-10-CM

## 2024-09-13 DIAGNOSIS — I5032 Chronic diastolic (congestive) heart failure: Secondary | ICD-10-CM

## 2024-09-13 NOTE — Progress Notes (Signed)
 Cardiology Office Note   Date:  09/13/2024  ID:  Stacey Sandoval, DOB 03-14-1951, MRN [id number] PCP: [facility]  [facility] Providers Cardiologist:  Evalene Lunger, MD  History of Present Illness Stacey Sandoval is a 73 y.o. female with a history of paroxysmal A-fib, COPD, chronic HFpEF, CAD, current smoker who is being seen for follow-up.   Patient was admitted to [facility] 06/12/2024 with chest pain.  High-sensitivity troponin trended up to the 400s.  Echocardiogram showed EF of 60 to 65%, no wall motion abnormality, mild to moderately dilated left atrium, mild MR, aortic valve sclerosis.  Left heart cath showed proximal RCA lesion 95% stenosis, heavily calcified focal lesion, proximal RCA to mid RCA lesion of 85% stenosis with 95% stenosis sidebranch in acute margin.  Moderately calcified involving a large RV marginal branch, mid RCA lesion 50% stenosis, proximal circumflex lesion 70% stenosis focal at this point will consider medical management pending completion of RCA intervention, mid LAD and distal LAD lesion 30% stenosis, and LVEDP normal.  There was no aortic valve stenosis.  Recommendations were to load with antiplatelet therapy and restart IV heparin  2 hours post TR band removal.  Was recommended patient be transferred to [facility] with plans for atherectomy based PCI to the RCA.  Patient was continued on aspirin  and Brilinta .  Patient underwent cardiac cath 7/8 with successful OTC guided CSI orbital atherectomy with DES x 2.  Recommendations with aspirin  and Plavix  and Eliquis  for 1 month with plan to drop aspirin  after 30 days.  Patient developed melena on 7 8.  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  She was started on PPI.  GI recommended resumption of Eliquis  7/14.  The patient was last seen 07/12/24 and was doing OK. She was on 2L O2. She remained in NSR.   Today, the  patient is overall doing well. She has breathing issues, back pain and low energy. She is on 2L O2. She has occasional SOB and on one episode heart rate was low 100s-she put the oxygen  on and felt better. She denies chest pain. She otherwise denies heart racing or fluttering. She has minimal lower leg edema. She did not want to do cardiac rehab.   Studies Reviewed EKG Interpretation Date/Time:  Friday September 13 2024 14:02:41 EDT Ventricular Rate:  75 PR Interval:  170 QRS Duration:  100 QT Interval:  386 QTC Calculation: 431 R Axis:   30  Text Interpretation: Normal sinus rhythm Low voltage QRS When compared with ECG of 06-Sep-2024 13:55, No significant change was found Confirmed by Franchester, Namiah Dunnavant ([id number]) on 09/13/2024 2:10:05 PM    Cath: 06/13/2024     Prox RCA lesion is 95% stenosed.  Heavily calcified focal lesion   Prox RCA to Mid RCA lesion is 85% stenosed with 90% stenosed side branch in Acute Mrg.  Moderately calcified involving large RV marginal branch.   Mid RCA lesion is 50% stenosed.   Prox Cx lesion is 70% stenosed.  (Focal) at this point would consider medical management pending completion of the RCA intervention   Mid LAD to Dist LAD lesion is 30% stenosed.   LV end diastolic pressure is normal. There is no aortic valve stenosis.   Dominance: Right body  RECOMMENDATIONS   Plan will be to load with antiplatelet agent (likely Brilinta ), restart IV heparin  2 hours post TR band removal.  Will arrange transfer to [facility] with plans for atherectomy based PCI to the RCA on Monday, 06/17/2024   Recommend dual antiplatelet therapy with Aspirin  81mg  daily and Ticagrelor  90mg  twice daily long-term (beyond 12 months) because of Will likely require long stent in the RCA.   Alm Clay, MD, MS   Cath: 06/18/2024     Lesion Segment #1 prox RCA to Mid RCA lesion is 85% stenosed with 70% stenosed side branch in Acute Mrg. Mid RCA lesion is 50% stenosed.   Orbital atherectomy  followed by OCT performed to ensure adequate stent coverage and expansion.   A drug-eluting stent was successfully placed covering the significant 85% lesion and the more distal 50% lesion, using a STENT SYNERGY XD 3.0X32.  Stent was postdilated to 3.3 mm. Post intervention, there is a 0% residual stenosis.  TIMI-3 flow maintained. Post intervention, the side branch was reduced to 60% residual stenosis.   Lesion #2: Prox RCA lesion is 95% stenosed.   -------------------------------------------------------------------------   Orbital atherectomy followed by OCT performed to ensure adequate stent coverage and expansion.   A drug-eluting stent was successfully placed using a STENT SYNERGY XD 4.0X28. Stent was postdilated in tapered fashion from 4.6 to 4.1 mm; Post intervention, there is a 0% residual stenosis. TIMI-3 flow maintained   -------------------------------------------------------------------------   Diagnostic               Dominance: Right                                                  Intervention             Successful OCT guided CSI orbital atherectomy based DES PCI of the ostial to distal RCA treated from significant calcified lesions and placing 2 overlapping Synergy XD DES stents: 3.0 mm x 32 mm postdilated to 3.3 mm, with a proximal overlapping 4.0 mm x 28 mm stent deployed to 4.1 mm and postdilated proximally to 4.5 mm based on OCT.  Transient hypotension during intervention requiring Levophed  that was easily weaned off upon completion of the procedure.  Baseline hypoxia and hypercapnia with oxygen  saturations in the high 80s with blood gas PCO2 in the 70s with pH roughly 7.3.    RECOMMENDATIONS   In the absence of any other complications or medical issues, we expect the patient to be ready for discharge from an interventional cardiology perspective on 06/19/2024.   Recommend to resume Apixaban , at currently prescribed dose and frequency on 06/19/2024.   Recommend concurrent  antiplatelet therapy of Aspirin  81 mg for 1 month and Clopidogrel  75mg  daily for 12 months .   Will restart IV heparin  overnight tonight and then initiate DOAC on 06/19/2024   Echo 06/2024 1. Left ventricular ejection fraction, by estimation, is 60 to 65%. The  left ventricle has normal function. The left ventricle has no regional  wall motion abnormalities. There is mild left ventricular hypertrophy of  the basal-septal segment. Left  ventricular diastolic parameters are consistent with Grade II diastolic  dysfunction (pseudonormalization).   2. Right ventricular systolic function is normal. The right ventricular  size is normal.   3. Left atrial size was mild to moderately dilated.   4. The mitral valve is normal in structure. Mild mitral valve  regurgitation.   5. The aortic valve was not well visualized. Aortic valve  regurgitation  is not visualized. Aortic valve sclerosis is present, with no evidence of  aortic valve stenosis. Aortic valve mean gradient measures 7.0 mmHg.        Physical Exam VS:  BP 119/60 (BP Location: Left Arm, Patient Position: Sitting, Cuff Size: Normal)   Pulse 75   Ht 4' 11 (1.499 m)   Wt 148 lb 6.4 oz (67.3 kg)   SpO2 96%   BMI 29.97 kg/m        Wt Readings from Last 3 Encounters:  09/13/24 148 lb 6.4 oz (67.3 kg)  09/06/24 147 lb (66.7 kg)  07/15/24 150 lb (68 kg)    GEN: Well nourished, well developed in no acute distress NECK: No JVD; No carotid bruits CARDIAC: RRR, no murmurs, rubs, gallops RESPIRATORY:  Clear to auscultation without rales, wheezing or rhonchi  ABDOMEN: Soft, non-tender, non-distended EXTREMITIES:  No edema; No deformity   ASSESSMENT AND PLAN  NSTEMI CAD s/p OTC guided CSI orbital atherectomy with DES x2 The patient denies chest pain. Breathing has overall improved, but she still uses O2 and night and PRN throughout the day. She is not interested in re-trial of cardiac rehab. Continue Plavix  and Eliquis  x 1 year, then can  likely stop Plavix . Continue Lipitor 40mg  daily, bisoprolol  5mg  daily, and SL NTG.   H/o GIB Patient denies any bleeding issues.  Most recent hemoglobin 9.3.  Paroxysmal Afib Patient remains in NSR.  Continue bisoprolol  5 mg daily and diltiazem  180 mg daily.  Patient denies any palpitations or heart racing.  She has not needed short acting diltiazem .  Continue Eliquis  5 mg daily for stroke prophylaxis.  Chronic diastolic heart failure Echo showed EF of 60 to 65%, no wall motion abnormality, moderately enlarged left atrium, mild MR.  Patient takes Lasix  20 mg as needed.  Continue Farxiga  10 mg daily and spironolactone  25 mg daily.  Patient is followed by advanced heart failure team.  Tobacco use COPD Patient uses 2 L oxygen  at night and as needed throughout the day.       Dispo: Follow-up in 6 months  Signed, Sloane Palmer VEAR Fishman, PA-C

## 2024-09-13 NOTE — Patient Instructions (Signed)
 Medication Instructions:  Your physician recommends that you continue on your current medications as directed. Please refer to the Current Medication list given to you today.    *If you need a refill on your cardiac medications before your next appointment, please call your pharmacy*  Lab Work: No labs ordered today    Testing/Procedures: No test ordered today   Follow-Up: At [facility], you and your health needs are our priority.  As part of our continuing mission to provide you with exceptional heart care, our providers are all part of one team.  This team includes your primary Cardiologist (physician) and Advanced Practice Providers or APPs (Physician Assistants and Nurse Practitioners) who all work together to provide you with the care you need, when you need it.  Your next appointment:   6 month(s)  Provider:   Timothy Gollan, MD or Cadence Franchester, PA-C

## 2024-09-16 ENCOUNTER — Ambulatory Visit

## 2024-09-16 ENCOUNTER — Encounter: Payer: Self-pay | Admitting: Emergency Medicine

## 2024-09-16 DIAGNOSIS — I214 Non-ST elevation (NSTEMI) myocardial infarction: Secondary | ICD-10-CM

## 2024-09-16 DIAGNOSIS — Z955 Presence of coronary angioplasty implant and graft: Secondary | ICD-10-CM

## 2024-09-16 NOTE — Progress Notes (Unsigned)
 Cardiac Individual Treatment Plan  Patient Details  Name: Stacey Sandoval MRN: [id number] Date of Birth: 09/04/1951 Referring Provider:   Flowsheet Row Cardiac Rehab from 07/09/2024 in [facility] Cardiac and Pulmonary Rehab  Referring Provider Dr. Evalene Lunger    Initial Encounter Date:  Flowsheet Row Cardiac Rehab from 07/09/2024 in [facility] Cardiac and Pulmonary Rehab  Date 07/09/24    Visit Diagnosis: NSTEMI (non-ST elevated myocardial infarction) [facility])  Status post coronary artery stent placement  Patient's Home Medications on Admission:  Current Outpatient Medications:    acetaminophen  (TYLENOL ) 650 MG CR tablet, Take 1,300 mg by mouth 2 (two) times daily., Disp: , Rfl:    albuterol  (PROVENTIL ) (2.5 MG/3ML) 0.083% nebulizer solution, Take 3 mLs (2.5 mg total) by nebulization every 6 (six) hours as needed for wheezing or shortness of breath., Disp: 75 mL, Rfl: 2   apixaban  (ELIQUIS ) 5 MG TABS tablet, Take 1 tablet (5 mg total) by mouth 2 (two) times daily., Disp: , Rfl:    atorvastatin  (LIPITOR) 40 MG tablet, Take 1 tablet (40 mg total) by mouth daily., Disp: , Rfl:    bisoprolol  (ZEBETA ) 5 MG tablet, TAKE 1 TABLET BY MOUTH EVERY DAY, Disp: 90 tablet, Rfl: 3   Budeson-Glycopyrrol-Formoterol  (BREZTRI  AEROSPHERE) 160-9-4.8 MCG/ACT AERO, Inhale 2 puffs into the lungs in the morning and at bedtime., Disp: 5.9 g, Rfl: 0   calcitRIOL (ROCALTROL) 0.25 MCG capsule, Take 0.25 mcg by mouth 2 (two) times daily., Disp: , Rfl:    clopidogrel  (PLAVIX ) 75 MG tablet, Take 1 tablet (75 mg total) by mouth daily., Disp: 90 tablet, Rfl: 2   dapagliflozin  propanediol (FARXIGA ) 10 MG TABS tablet, Take 1 tablet (10 mg total) by mouth daily., Disp: 90 tablet, Rfl: 1   diltiazem  (CARDIZEM  CD) 180 MG 24 hr capsule, TAKE 1 CAPSULE(180 MG) BY MOUTH DAILY, Disp: 90 capsule, Rfl: 2   diltiazem  (CARDIZEM ) 30 MG tablet, Take 1 tablet (30 mg total) by mouth as needed (atrial fibrillation)., Disp: 30 tablet, Rfl: 3    Ferrous Sulfate  (IRON ) 325 (65 Fe) MG TABS, Take 1 tablet (325 mg total) by mouth in the morning and at bedtime. (Patient not taking: Reported on 09/13/2024), Disp: 60 tablet, Rfl: 0   furosemide  (LASIX ) 20 MG tablet, Take 1 tablet (20 mg total) by mouth daily as needed., Disp: 90 tablet, Rfl: 3   gabapentin  (NEURONTIN ) 300 MG capsule, Take 300 mg every am & 600 in the pm, Disp: , Rfl:    levothyroxine  (SYNTHROID ) 100 MCG tablet, Take 100 mcg by mouth daily before breakfast., Disp: , Rfl:    mirtazapine  (REMERON ) 45 MG tablet, Take 45 mg by mouth at bedtime., Disp: , Rfl:    nitroGLYCERIN  (NITROSTAT ) 0.4 MG SL tablet, Place 1 tablet (0.4 mg total) under the tongue every 5 (five) minutes x 3 doses as needed for chest pain., Disp: 25 tablet, Rfl: 1   OXYGEN , Inhale into the lungs as needed. 2-3 Liters, Disp: , Rfl:    pantoprazole  (PROTONIX ) 40 MG tablet, Take 1 tablet (40 mg total) by mouth daily., Disp: 90 tablet, Rfl: 3   potassium chloride  SA (KLOR-CON  M) 20 MEQ tablet, Take 20 mEq by mouth 4 (four) times daily as needed (prn)., Disp: , Rfl:    spironolactone  (ALDACTONE ) 25 MG tablet, Take 1 tablet (25 mg total) by mouth daily., Disp: 90 tablet, Rfl: 3   triamcinolone ointment (KENALOG) 0.5 %, Apply 1 Application topically 2 (two) times daily., Disp: , Rfl:   Past Medical  History: Past Medical History:  Diagnosis Date   Atrial fibrillation (HCC)    CHF (congestive heart failure) (HCC)    Complication of anesthesia    VERY slow to wake after hysterectomy   COPD (chronic obstructive pulmonary disease) (HCC)    Enlarged thyroid     has been referred to [facility]   Hyperlipidemia    Hypertension    Smokers' cough (HCC)    Tobacco use    Wears dentures    Full upper and lower    Tobacco Use: Social History   Tobacco Use  Smoking Status Every Day   Current packs/day: 0.25   Average packs/day: 0.3 packs/day for 40.0 years (10.0 ttl pk-yrs)   Types: Cigarettes  Smokeless Tobacco Never   Tobacco Comments   0.5PPD 05/31/2023    Labs: Review Flowsheet  More data may exist      Latest Ref Rng & Units 03/08/2022 06/14/2024 06/15/2024 06/18/2024 07/15/2024  Labs for ITP Cardiac and Pulmonary Rehab  Cholestrol 0 - 200 mg/dL - 889  - - -  LDL (calc) 0 - 99 mg/dL - 49  - - -  HDL-C >59 mg/dL - 49  - - -  Trlycerides <150 mg/dL - 60  - - -  PH, Arterial 7.35 - 7.45 7.31  - - 7.298  7.291  -  PCO2 arterial 32 - 48 mmHg 65  - - 72.7  73.7  -  Bicarbonate 20.0 - 28.0 mmol/L 32.7  - 38.8  34.1  35.6  35.5  28.2  34.7  31.5   TCO2 22 - 32 mmol/L - - - 38  38  -  O2 Saturation % 100  - 73.6  93.4  78  62  42.1  25.2  34.6     Details       Multiple values from one day are sorted in reverse-chronological order          Exercise Target Goals: Exercise Program Goal: Individual exercise prescription set using results from initial 6 min walk test and THRR while considering  patient's activity barriers and safety.   Exercise Prescription Goal: Initial exercise prescription builds to 30-45 minutes a day of aerobic activity, 2-3 days per week.  Home exercise guidelines will be given to patient during program as part of exercise prescription that the participant will acknowledge.   Education: Aerobic Exercise: - Group verbal and visual presentation on the components of exercise prescription. Introduces F.I.T.T principle from ACSM for exercise prescriptions.  Reviews F.I.T.T. principles of aerobic exercise including progression. Written material provided at class time.   Education: Resistance Exercise: - Group verbal and visual presentation on the components of exercise prescription. Introduces F.I.T.T principle from ACSM for exercise prescriptions  Reviews F.I.T.T. principles of resistance exercise including progression. Written material provided at class time.    Education: Exercise & Equipment Safety: - Individual verbal instruction and demonstration of equipment use and safety with  use of the equipment. Flowsheet Row Cardiac Rehab from 07/09/2024 in [facility] Cardiac and Pulmonary Rehab  Date 07/09/24  Educator [facility]  Instruction Review Code 1- Verbalizes Understanding    Education: Exercise Physiology & General Exercise Guidelines: - Group verbal and written instruction with models to review the exercise physiology of the cardiovascular system and associated critical values. Provides general exercise guidelines with specific guidelines to those with heart or lung disease. Written material provided at class time.   Education: Flexibility, Balance, Mind/Body Relaxation: - Group verbal and visual presentation with interactive activity on  the components of exercise prescription. Introduces F.I.T.T principle from ACSM for exercise prescriptions. Reviews F.I.T.T. principles of flexibility and balance exercise training including progression. Also discusses the mind body connection.  Reviews various relaxation techniques to help reduce and manage stress (i.e. Deep breathing, progressive muscle relaxation, and visualization). Balance handout provided to take home. Written material provided at class time.   Activity Barriers & Risk Stratification:  Activity Barriers & Cardiac Risk Stratification - 07/09/24 1505       Activity Barriers & Cardiac Risk Stratification   Activity Barriers Arthritis;Back Problems;Deconditioning;Shortness of Breath;Assistive Device    Cardiac Risk Stratification Moderate          6 Minute Walk:  6 Minute Walk     Row Name 07/09/24 1503         6 Minute Walk   Phase Initial     Distance 570 feet     Walk Time 6 minutes     # of Rest Breaks 0     MPH 1.08     METS 2     RPE 13     Perceived Dyspnea  2     VO2 Peak 3.6     Symptoms Yes (comment)     Comments Right hip pain, hamstring discomfort     Resting HR 73 bpm     Resting BP 110/58     Resting Oxygen  Saturation  96 %     Exercise Oxygen  Saturation  during 6 min walk 98 %     Max Ex.  HR 94 bpm     Max Ex. BP 126/60     2 Minute Post BP 118/58        Oxygen  Initial Assessment:   Oxygen  Re-Evaluation:   Oxygen  Discharge (Final Oxygen  Re-Evaluation):   Initial Exercise Prescription:  Initial Exercise Prescription - 07/09/24 1500       Date of Initial Exercise RX and Referring Provider   Date 07/09/24    Referring Provider Dr. Timothy Gollan      Oxygen    Oxygen  Continuous    Liters 2L    Maintain Oxygen  Saturation 88% or higher      Recumbant Bike   Level 1    RPM 50    Watts 25    Minutes 15    METs 2      NuStep   Level 1    SPM 80    Minutes 15    METs 2      Arm Ergometer   Level 1    Watts 25    RPM 25    Minutes 15    METs 2      Track   Laps 15    Minutes 15    METs 1.82      Prescription Details   Duration Progress to 30 minutes of continuous aerobic without signs/symptoms of physical distress      Intensity   THRR 40-80% of Max Heartrate 101-130    Ratings of Perceived Exertion 11-13    Perceived Dyspnea 0-4      Progression   Progression Continue to progress workloads to maintain intensity without signs/symptoms of physical distress.      Resistance Training   Training Prescription Yes    Weight 3lb    Reps 10-15          Perform Capillary Blood Glucose checks as needed.  Exercise Prescription Changes:   Exercise Prescription Changes     Row Name 07/09/24  1500             Response to Exercise   Blood Pressure (Admit) 110/58       Blood Pressure (Exercise) 126/60       Blood Pressure (Exit) 118/58       Heart Rate (Admit) 73 bpm       Heart Rate (Exercise) 94 bpm       Heart Rate (Exit) 75 bpm       Oxygen  Saturation (Admit) 96 %       Oxygen  Saturation (Exercise) 98 %       Oxygen  Saturation (Exit) 98 %       Rating of Perceived Exertion (Exercise) 13       Perceived Dyspnea (Exercise) 2       Symptoms bilateral hip pain, hamstring discomfort       Comments results          Exercise  Comments:   Exercise Goals and Review:   Exercise Goals     Row Name 07/09/24 1509             Exercise Goals   Increase Physical Activity Yes       Intervention Provide advice, education, support and counseling about physical activity/exercise needs.;Develop an individualized exercise prescription for aerobic and resistive training based on initial evaluation findings, risk stratification, comorbidities and participant's personal goals.       Expected Outcomes Short Term: Attend rehab on a regular basis to increase amount of physical activity.;Long Term: Add in home exercise to make exercise part of routine and to increase amount of physical activity.;Long Term: Exercising regularly at least 3-5 days a week.       Increase Strength and Stamina Yes       Intervention Provide advice, education, support and counseling about physical activity/exercise needs.;Develop an individualized exercise prescription for aerobic and resistive training based on initial evaluation findings, risk stratification, comorbidities and participant's personal goals.       Expected Outcomes Short Term: Increase workloads from initial exercise prescription for resistance, speed, and METs.;Short Term: Perform resistance training exercises routinely during rehab and add in resistance training at home;Long Term: Improve cardiorespiratory fitness, muscular endurance and strength as measured by increased METs and functional capacity ( )       Able to understand and use rate of perceived exertion (RPE) scale Yes       Intervention Provide education and explanation on how to use RPE scale       Expected Outcomes Long Term:  Able to use RPE to guide intensity level when exercising independently;Short Term: Able to use RPE daily in rehab to express subjective intensity level       Able to understand and use Dyspnea scale Yes       Intervention Provide education and explanation on how to use Dyspnea scale       Expected  Outcomes Long Term: Able to use Dyspnea scale to guide intensity level when exercising independently;Short Term: Able to use Dyspnea scale daily in rehab to express subjective sense of shortness of breath during exertion       Knowledge and understanding of Target Heart Rate Range (THRR) Yes       Intervention Provide education and explanation of THRR including how the numbers were predicted and where they are located for reference       Expected Outcomes Short Term: Able to state/look up THRR;Short Term: Able to use daily as guideline for intensity in  rehab;Long Term: Able to use THRR to govern intensity when exercising independently       Able to check pulse independently Yes       Intervention Provide education and demonstration on how to check pulse in carotid and radial arteries.;Review the importance of being able to check your own pulse for safety during independent exercise       Expected Outcomes Short Term: Able to explain why pulse checking is important during independent exercise;Long Term: Able to check pulse independently and accurately       Understanding of Exercise Prescription Yes       Intervention Provide education, explanation, and written materials on patient's individual exercise prescription       Expected Outcomes Short Term: Able to explain program exercise prescription;Long Term: Able to explain home exercise prescription to exercise independently          Exercise Goals Re-Evaluation :   Discharge Exercise Prescription (Final Exercise Prescription Changes):  Exercise Prescription Changes - 07/09/24 1500       Response to Exercise   Blood Pressure (Admit) 110/58    Blood Pressure (Exercise) 126/60    Blood Pressure (Exit) 118/58    Heart Rate (Admit) 73 bpm    Heart Rate (Exercise) 94 bpm    Heart Rate (Exit) 75 bpm    Oxygen  Saturation (Admit) 96 %    Oxygen  Saturation (Exercise) 98 %    Oxygen  Saturation (Exit) 98 %    Rating of Perceived Exertion (Exercise)  13    Perceived Dyspnea (Exercise) 2    Symptoms bilateral hip pain, hamstring discomfort    Comments results          Nutrition:  Target Goals: Understanding of nutrition guidelines, daily intake of sodium 1500mg , cholesterol 200mg , calories 30% from fat and 7% or less from saturated fats, daily to have 5 or more servings of fruits and vegetables.  Education: Nutrition 1 -Group instruction provided by verbal, written material, interactive activities, discussions, models, and posters to present general guidelines for heart healthy nutrition including macronutrients, label reading, and promoting whole foods over processed counterparts. Education serves as Pensions consultant of discussion of heart healthy eating for all. Written material provided at class time.    Education: Nutrition 2 -Group instruction provided by verbal, written material, interactive activities, discussions, models, and posters to present general guidelines for heart healthy nutrition including sodium, cholesterol, and saturated fat. Providing guidance of habit forming to improve blood pressure, cholesterol, and body weight. Written material provided at class time.     Biometrics:  Pre Biometrics - 07/09/24 1509       Pre Biometrics   Height 4' 11.8 (1.519 m)    Weight 149 lb 8 oz (67.8 kg)    Waist Circumference 38 inches    Hip Circumference 40.5 inches    Waist to Hip Ratio 0.94 %    BMI (Calculated) 29.39    Single Leg Stand 6.81 seconds           Nutrition Therapy Plan and Nutrition Goals:   Nutrition Assessments:  MEDIFICTS Score Key: >=70 Need to make dietary changes  40-70 Heart Healthy Diet <= 40 Therapeutic Level Cholesterol Diet   Picture Your Plate Scores: <59 Unhealthy dietary pattern with much room for improvement. 41-50 Dietary pattern unlikely to meet recommendations for good health and room for improvement. 51-60 More healthful dietary pattern, with some room for improvement.   >60 Healthy dietary pattern, although there may be some specific behaviors  that could be improved.    Nutrition Goals Re-Evaluation:   Nutrition Goals Discharge (Final Nutrition Goals Re-Evaluation):   Psychosocial: Target Goals: Acknowledge presence or absence of significant depression and/or stress, maximize coping skills, provide positive support system. Participant is able to verbalize types and ability to use techniques and skills needed for reducing stress and depression.   Education: Stress, Anxiety, and Depression - Group verbal and visual presentation to define topics covered.  Reviews how body is impacted by stress, anxiety, and depression.  Also discusses healthy ways to reduce stress and to treat/manage anxiety and depression. Written material provided at class time.   Education: Sleep Hygiene -Provides group verbal and written instruction about how sleep can affect your health.  Define sleep hygiene, discuss sleep cycles and impact of sleep habits. Review good sleep hygiene tips.   Initial Review & Psychosocial Screening:  Initial Psych Review & Screening - 07/03/24 1428       Initial Review   Current issues with None Identified      Family Dynamics   Good Support System? Yes    Comments Aarilyn states she can look to her daughter Luke for support. She denies any mental instability and takes no medications for her mood.      Barriers   Psychosocial barriers to participate in program The patient should benefit from training in stress management and relaxation.;There are no identifiable barriers or psychosocial needs.      Screening Interventions   Interventions Provide feedback about the scores to participant;To provide support and resources with identified psychosocial needs;Encouraged to exercise    Expected Outcomes Short Term goal: Utilizing psychosocial counselor, staff and physician to assist with identification of specific Stressors or current issues interfering  with healing process. Setting desired goal for each stressor or current issue identified.;Long Term Goal: Stressors or current issues are controlled or eliminated.;Short Term goal: Identification and review with participant of any Quality of Life or Depression concerns found by scoring the questionnaire.;Long Term goal: The participant improves quality of Life and PHQ9 Scores as seen by post scores and/or verbalization of changes          Quality of Life Scores:   Scores of 19 and below usually indicate a poorer quality of life in these areas.  A difference of  2-3 points is a clinically meaningful difference.  A difference of 2-3 points in the total score of the Quality of Life Index has been associated with significant improvement in overall quality of life, self-image, physical symptoms, and general health in studies assessing change in quality of life.  PHQ-9: Review Flowsheet       07/09/2024  Depression screen PHQ 2/9  Decreased Interest 0  Down, Depressed, Hopeless 1  PHQ - 2 Score 1  Altered sleeping 1  Tired, decreased energy 1  Change in appetite 0  Feeling bad or failure about yourself  0  Trouble concentrating 0  Moving slowly or fidgety/restless 2  Suicidal thoughts 0  PHQ-9 Score 5  Difficult doing work/chores Not difficult at all   Interpretation of Total Score  Total Score Depression Severity:  1-4 = Minimal depression, 5-9 = Mild depression, 10-14 = Moderate depression, 15-19 = Moderately severe depression, 20-27 = Severe depression   Psychosocial Evaluation and Intervention:  Psychosocial Evaluation - 07/03/24 1433       Psychosocial Evaluation & Interventions   Interventions Encouraged to exercise with the program and follow exercise prescription;Relaxation education;Stress management education    Comments Andi  states she can look to her daughter Luke for support. She denies any mental instability and takes no medications for her mood.    Expected Outcomes  Short: Start [facility] to help with mood. Long: Maintain a healthy mental state    Continue Psychosocial Services  Follow up required by staff          Psychosocial Re-Evaluation:   Psychosocial Discharge (Final Psychosocial Re-Evaluation):   Vocational Rehabilitation: Provide vocational rehab assistance to qualifying candidates.   Vocational Rehab Evaluation & Intervention:   Education: Education Goals: Education classes will be provided on a variety of topics geared toward better understanding of heart health and risk factor modification. Participant will state understanding/return demonstration of topics presented as noted by education test scores.  Learning Barriers/Preferences:  Learning Barriers/Preferences - 07/03/24 1425       Learning Barriers/Preferences   Learning Barriers None    Learning Preferences None          General Cardiac Education Topics:  AED/CPR: - Group verbal and written instruction with the use of models to demonstrate the basic use of the AED with the basic ABC's of resuscitation.   Test and Procedures: - Group verbal and visual presentation and models provide information about basic cardiac anatomy and function. Reviews the testing methods done to diagnose heart disease and the outcomes of the test results. Describes the treatment choices: Medical Management, Angioplasty, or Coronary Bypass Surgery for treating various heart conditions including Myocardial Infarction, Angina, Valve Disease, and Cardiac Arrhythmias. Written material provided at class time.   Medication Safety: - Group verbal and visual instruction to review commonly prescribed medications for heart and lung disease. Reviews the medication, class of the drug, and side effects. Includes the steps to properly store meds and maintain the prescription regimen. Written material provided at class time.   Intimacy: - Group verbal instruction through game format to discuss how heart  and lung disease can affect sexual intimacy. Written material provided at class time.   Know Your Numbers and Heart Failure: - Group verbal and visual instruction to discuss disease risk factors for cardiac and pulmonary disease and treatment options.  Reviews associated critical values for Overweight/Obesity, Hypertension, Cholesterol, and Diabetes.  Discusses basics of heart failure: signs/symptoms and treatments.  Introduces Heart Failure Zone chart for action plan for heart failure. Written material provided at class time.   Infection Prevention: - Provides verbal and written material to individual with discussion of infection control including proper hand washing and proper equipment cleaning during exercise session. Flowsheet Row Cardiac Rehab from 07/09/2024 in [facility] Cardiac and Pulmonary Rehab  Date 07/09/24  Educator [facility]  Instruction Review Code 1- Verbalizes Understanding    Falls Prevention: - Provides verbal and written material to individual with discussion of falls prevention and safety. Flowsheet Row Cardiac Rehab from 07/09/2024 in [facility] Cardiac and Pulmonary Rehab  Date 07/09/24  Educator [facility]  Instruction Review Code 1- Verbalizes Understanding    Other: -Provides group and verbal instruction on various topics (see comments)   Knowledge Questionnaire Score:   Core Components/Risk Factors/Patient Goals at Admission:  Personal Goals and Risk Factors at Admission - 07/03/24 1425       Core Components/Risk Factors/Patient Goals on Admission    Weight Management Yes;Weight Loss    Intervention Weight Management: Develop a combined nutrition and exercise program designed to reach desired caloric intake, while maintaining appropriate intake of nutrient and fiber, sodium and fats, and appropriate energy expenditure required for the weight goal.;Weight  Management: Provide education and appropriate resources to help participant work on and attain dietary goals.;Weight  Management/Obesity: Establish reasonable short term and long term weight goals.    Expected Outcomes Short Term: Continue to assess and modify interventions until short term weight is achieved;Weight Loss: Understanding of general recommendations for a balanced deficit meal plan, which promotes 1-2 lb weight loss per week and includes a negative energy balance of [phone] kcal/d;Understanding recommendations for meals to include 15-35% energy as protein, 25-35% energy from fat, 35-60% energy from carbohydrates, less than 200mg  of dietary cholesterol, 20-35 gm of total fiber daily;Understanding of distribution of calorie intake throughout the day with the consumption of 4-5 meals/snacks    Tobacco Cessation Yes    Number of packs per day .20    Intervention Assist the participant in steps to quit. Provide individualized education and counseling about committing to Tobacco Cessation, relapse prevention, and pharmacological support that can be provided by physician.;Education officer, environmental, assist with locating and accessing local/national Quit Smoking programs, and support quit date choice.    Expected Outcomes Short Term: Will quit all tobacco product use, adhering to prevention of relapse plan.;Short Term: Will demonstrate readiness to quit, by selecting a quit date.;Long Term: Complete abstinence from all tobacco products for at least 12 months from quit date.    Heart Failure Yes    Intervention Provide a combined exercise and nutrition program that is supplemented with education, support and counseling about heart failure. Directed toward relieving symptoms such as shortness of breath, decreased exercise tolerance, and extremity edema.    Expected Outcomes Improve functional capacity of life;Short term: Attendance in program 2-3 days a week with increased exercise capacity. Reported lower sodium intake. Reported increased fruit and vegetable intake. Reports medication compliance.;Short term: Daily  weights obtained and reported for increase. Utilizing diuretic protocols set by physician.;Long term: Adoption of self-care skills and reduction of barriers for early signs and symptoms recognition and intervention leading to self-care maintenance.    Hypertension Yes    Intervention Provide education on lifestyle modifcations including regular physical activity/exercise, weight management, moderate sodium restriction and increased consumption of fresh fruit, vegetables, and low fat dairy, alcohol  moderation, and smoking cessation.;Monitor prescription use compliance.    Expected Outcomes Short Term: Continued assessment and intervention until BP is < 140/2mm HG in hypertensive participants. < 130/75mm HG in hypertensive participants with diabetes, heart failure or chronic kidney disease.;Long Term: Maintenance of blood pressure at goal levels.    Lipids Yes    Intervention Provide education and support for participant on nutrition & aerobic/resistive exercise along with prescribed medications to achieve LDL 70mg , HDL >40mg .    Expected Outcomes Short Term: Participant states understanding of desired cholesterol values and is compliant with medications prescribed. Participant is following exercise prescription and nutrition guidelines.;Long Term: Cholesterol controlled with medications as prescribed, with individualized exercise RX and with personalized nutrition plan. Value goals: LDL < 70mg , HDL > 40 mg.          Education:Diabetes - Individual verbal and written instruction to review signs/symptoms of diabetes, desired ranges of glucose level fasting, after meals and with exercise. Acknowledge that pre and post exercise glucose checks will be done for 3 sessions at entry of program.   Core Components/Risk Factors/Patient Goals Review:    Core Components/Risk Factors/Patient Goals at Discharge (Final Review):    ITP Comments:  ITP Comments     Row Name 07/03/24 1432 07/09/24 1503  07/10/24 0739 08/07/24 0757 09/04/24 9045  ITP Comments Virtual Visit completed. Patient informed on EP and RD appointment and 6 Minute walk test. Patient also informed of patient health questionnaires on My Chart. Patient Verbalizes understanding. Visit diagnosis can be found in [facility] 06/13/2024. Completed and gym orientation for cardiac rehab. Initial ITP created and sent for review to Dr. Oneil Pinal, Medical Director. 30 Day review completed. Medical Director ITP review done, changes made as directed, and signed approval by Medical Director. New to program. 30 Day review completed. Medical Director ITP review done; changes made as directed and signed approval by Medical Director. 30 Day review completed. Medical Director ITP review done; changes made as directed and signed approval by Medical Director.    Row Name 09/16/24 1002           ITP Comments Patient did not attend first day of rehab. She expressed to her cardiologist at her last visit that she is not interested in attending cardiac rehab at this time, so she will be discharged from the program.          Comments: Early Discharge ITP

## 2024-09-16 NOTE — Progress Notes (Unsigned)
 Early Discharge Summary  Stacey Sandoval Jun 21, 1951  Stacey Sandoval is discharging early due to lack of interest in the program. She completed 0 of 36 sessions.    6 Minute Walk     Row Name 07/09/24 1503         6 Minute Walk   Phase Initial     Distance 570 feet     Walk Time 6 minutes     # of Rest Breaks 0     MPH 1.08     METS 2     RPE 13     Perceived Dyspnea  2     VO2 Peak 3.6     Symptoms Yes (comment)     Comments Right hip pain, hamstring discomfort     Resting HR 73 bpm     Resting BP 110/58     Resting Oxygen  Saturation  96 %     Exercise Oxygen  Saturation  during 6 min walk 98 %     Max Ex. HR 94 bpm     Max Ex. BP 126/60     2 Minute Post BP 118/58

## 2024-09-18 ENCOUNTER — Ambulatory Visit

## 2024-09-23 ENCOUNTER — Ambulatory Visit

## 2024-09-25 ENCOUNTER — Ambulatory Visit

## 2024-09-30 ENCOUNTER — Ambulatory Visit

## 2024-10-02 ENCOUNTER — Ambulatory Visit

## 2024-10-07 ENCOUNTER — Ambulatory Visit

## 2024-10-09 ENCOUNTER — Ambulatory Visit

## 2024-10-14 ENCOUNTER — Ambulatory Visit

## 2024-10-16 ENCOUNTER — Ambulatory Visit

## 2024-10-21 ENCOUNTER — Ambulatory Visit

## 2024-10-23 ENCOUNTER — Ambulatory Visit

## 2024-10-24 ENCOUNTER — Other Ambulatory Visit: Payer: Self-pay

## 2024-10-25 NOTE — Telephone Encounter (Signed)
 For review-are we managing? Ok to fill?

## 2024-10-28 ENCOUNTER — Ambulatory Visit

## 2024-10-28 MED ORDER — DAPAGLIFLOZIN PROPANEDIOL 10 MG PO TABS: 10.0000 mg | ORAL_TABLET | Freq: Every day | ORAL | 1 refills | Status: DC

## 2024-10-30 ENCOUNTER — Ambulatory Visit

## 2024-10-31 ENCOUNTER — Other Ambulatory Visit: Payer: Self-pay | Admitting: Cardiovascular Disease

## 2024-11-01 MED ORDER — CLOPIDOGREL BISULFATE 75 MG PO TABS
75.0000 mg | ORAL_TABLET | Freq: Every day | ORAL | 3 refills | Status: AC
Start: 2024-11-01 — End: ?

## 2024-11-04 ENCOUNTER — Ambulatory Visit

## 2024-11-06 ENCOUNTER — Ambulatory Visit

## 2025-01-07 ENCOUNTER — Telehealth: Payer: Self-pay | Admitting: Family

## 2025-01-07 NOTE — Telephone Encounter (Signed)
 LM on patient's voicemail to call back to confirm her [facility] appointment on 01/08/25 at 2pm.

## 2025-01-08 ENCOUNTER — Encounter: Admitting: Family

## 2025-01-11 ENCOUNTER — Other Ambulatory Visit: Payer: Self-pay | Admitting: Medical

## 2025-01-20 ENCOUNTER — Inpatient Hospital Stay: Admitting: Oncology

## 2025-01-20 ENCOUNTER — Inpatient Hospital Stay

## 2025-03-14 ENCOUNTER — Ambulatory Visit: Admitting: Medical
